# Patient Record
Sex: Male | Born: 1935 | Race: White | Hispanic: No | Smoking: Former smoker
Health system: Southern US, Community
[De-identification: ages and names within clinical notes are randomized; demographics above are authoritative.]

## PROBLEM LIST (undated history)

## (undated) DIAGNOSIS — I10 Essential (primary) hypertension: Secondary | ICD-10-CM

## (undated) DIAGNOSIS — Z87442 Personal history of urinary calculi: Secondary | ICD-10-CM

## (undated) DIAGNOSIS — G4733 Obstructive sleep apnea (adult) (pediatric): Secondary | ICD-10-CM

## (undated) DIAGNOSIS — R519 Headache, unspecified: Secondary | ICD-10-CM

## (undated) DIAGNOSIS — R972 Elevated prostate specific antigen [PSA]: Secondary | ICD-10-CM

## (undated) DIAGNOSIS — N403 Nodular prostate with lower urinary tract symptoms: Secondary | ICD-10-CM

## (undated) DIAGNOSIS — N400 Enlarged prostate without lower urinary tract symptoms: Secondary | ICD-10-CM

## (undated) DIAGNOSIS — K5790 Diverticulosis of intestine, part unspecified, without perforation or abscess without bleeding: Secondary | ICD-10-CM

## (undated) DIAGNOSIS — N138 Other obstructive and reflux uropathy: Secondary | ICD-10-CM

## (undated) DIAGNOSIS — M51369 Other intervertebral disc degeneration, lumbar region without mention of lumbar back pain or lower extremity pain: Secondary | ICD-10-CM

## (undated) DIAGNOSIS — N529 Male erectile dysfunction, unspecified: Secondary | ICD-10-CM

## (undated) DIAGNOSIS — M5430 Sciatica, unspecified side: Secondary | ICD-10-CM

## (undated) DIAGNOSIS — N23 Unspecified renal colic: Secondary | ICD-10-CM

## (undated) DIAGNOSIS — R51 Headache: Secondary | ICD-10-CM

## (undated) DIAGNOSIS — R310 Gross hematuria: Secondary | ICD-10-CM

## (undated) DIAGNOSIS — R339 Retention of urine, unspecified: Secondary | ICD-10-CM

## (undated) DIAGNOSIS — F329 Major depressive disorder, single episode, unspecified: Secondary | ICD-10-CM

## (undated) DIAGNOSIS — R350 Frequency of micturition: Secondary | ICD-10-CM

## (undated) DIAGNOSIS — F32A Depression, unspecified: Secondary | ICD-10-CM

## (undated) DIAGNOSIS — N189 Chronic kidney disease, unspecified: Secondary | ICD-10-CM

## (undated) DIAGNOSIS — N62 Hypertrophy of breast: Secondary | ICD-10-CM

## (undated) DIAGNOSIS — T7840XA Allergy, unspecified, initial encounter: Secondary | ICD-10-CM

## (undated) DIAGNOSIS — L719 Rosacea, unspecified: Secondary | ICD-10-CM

## (undated) DIAGNOSIS — M5136 Other intervertebral disc degeneration, lumbar region: Secondary | ICD-10-CM

## (undated) HISTORY — DX: Benign prostatic hyperplasia without lower urinary tract symptoms: N40.0

## (undated) HISTORY — DX: Diverticulosis of intestine, part unspecified, without perforation or abscess without bleeding: K57.90

## (undated) HISTORY — DX: Elevated prostate specific antigen (PSA): R97.20

## (undated) HISTORY — DX: Male erectile dysfunction, unspecified: N52.9

## (undated) HISTORY — DX: Other intervertebral disc degeneration, lumbar region: M51.36

## (undated) HISTORY — DX: Frequency of micturition: R35.0

## (undated) HISTORY — DX: Gross hematuria: R31.0

## (undated) HISTORY — DX: Obstructive sleep apnea (adult) (pediatric): G47.33

## (undated) HISTORY — DX: Hypertrophy of breast: N62

## (undated) HISTORY — DX: Other obstructive and reflux uropathy: N13.8

## (undated) HISTORY — DX: Personal history of urinary calculi: Z87.442

## (undated) HISTORY — DX: Chronic kidney disease, unspecified: N18.9

## (undated) HISTORY — DX: Rosacea, unspecified: L71.9

## (undated) HISTORY — DX: Headache: R51

## (undated) HISTORY — DX: Allergy, unspecified, initial encounter: T78.40XA

## (undated) HISTORY — DX: Essential (primary) hypertension: I10

## (undated) HISTORY — DX: Nodular prostate with lower urinary tract symptoms: N40.3

## (undated) HISTORY — DX: Unspecified renal colic: N23

## (undated) HISTORY — DX: Other intervertebral disc degeneration, lumbar region without mention of lumbar back pain or lower extremity pain: M51.369

## (undated) HISTORY — DX: Depression, unspecified: F32.A

## (undated) HISTORY — DX: Major depressive disorder, single episode, unspecified: F32.9

## (undated) HISTORY — DX: Headache, unspecified: R51.9

## (undated) HISTORY — DX: Retention of urine, unspecified: R33.9

## (undated) HISTORY — DX: Sciatica, unspecified side: M54.30

## (undated) HISTORY — PX: TRANSURETHRAL RESECTION OF PROSTATE: SHX73

---

## 1999-08-28 HISTORY — PX: OTHER SURGICAL HISTORY: SHX169

## 1999-12-28 HISTORY — PX: ANGIOPLASTY: SHX39

## 2000-05-21 DIAGNOSIS — I2510 Atherosclerotic heart disease of native coronary artery without angina pectoris: Secondary | ICD-10-CM | POA: Insufficient documentation

## 2004-09-26 ENCOUNTER — Encounter: Payer: Self-pay | Admitting: Internal Medicine

## 2004-10-27 ENCOUNTER — Encounter: Payer: Self-pay | Admitting: Internal Medicine

## 2004-11-08 ENCOUNTER — Emergency Department: Payer: Self-pay | Admitting: Emergency Medicine

## 2004-11-26 ENCOUNTER — Encounter: Payer: Self-pay | Admitting: Internal Medicine

## 2004-12-01 ENCOUNTER — Ambulatory Visit: Payer: Self-pay | Admitting: Family Medicine

## 2004-12-01 HISTORY — PX: OTHER SURGICAL HISTORY: SHX169

## 2004-12-27 ENCOUNTER — Encounter: Payer: Self-pay | Admitting: Internal Medicine

## 2005-03-02 ENCOUNTER — Ambulatory Visit: Payer: Self-pay | Admitting: Unknown Physician Specialty

## 2005-06-02 ENCOUNTER — Encounter: Payer: Self-pay | Admitting: Internal Medicine

## 2005-10-19 ENCOUNTER — Ambulatory Visit: Payer: Self-pay | Admitting: Family Medicine

## 2005-10-19 HISTORY — PX: MRI CERVICAL SPINE WO CONTRAST (ARMC HX): HXRAD1796

## 2005-12-23 ENCOUNTER — Ambulatory Visit: Payer: Self-pay | Admitting: General Surgery

## 2006-01-18 ENCOUNTER — Ambulatory Visit: Payer: Self-pay | Admitting: General Surgery

## 2006-05-04 ENCOUNTER — Ambulatory Visit: Payer: Self-pay | Admitting: Urology

## 2006-05-25 DIAGNOSIS — E785 Hyperlipidemia, unspecified: Secondary | ICD-10-CM | POA: Insufficient documentation

## 2006-05-25 DIAGNOSIS — I10 Essential (primary) hypertension: Secondary | ICD-10-CM | POA: Insufficient documentation

## 2006-07-27 HISTORY — PX: CATARACT EXTRACTION: SUR2

## 2006-11-15 ENCOUNTER — Ambulatory Visit: Payer: Self-pay | Admitting: Urology

## 2007-05-10 ENCOUNTER — Ambulatory Visit: Payer: Self-pay | Admitting: Urology

## 2007-08-09 ENCOUNTER — Ambulatory Visit: Payer: Self-pay | Admitting: Family Medicine

## 2007-08-19 ENCOUNTER — Inpatient Hospital Stay: Payer: Self-pay | Admitting: Internal Medicine

## 2007-08-19 ENCOUNTER — Other Ambulatory Visit: Payer: Self-pay

## 2007-08-27 ENCOUNTER — Emergency Department: Payer: Self-pay | Admitting: Emergency Medicine

## 2007-08-27 ENCOUNTER — Other Ambulatory Visit: Payer: Self-pay

## 2007-09-11 HISTORY — PX: SPIROMETRY: SHX456

## 2007-09-27 ENCOUNTER — Ambulatory Visit: Payer: Self-pay | Admitting: Urology

## 2007-10-12 ENCOUNTER — Ambulatory Visit: Payer: Self-pay | Admitting: Urology

## 2007-10-17 ENCOUNTER — Ambulatory Visit: Payer: Self-pay | Admitting: Internal Medicine

## 2007-11-08 ENCOUNTER — Ambulatory Visit: Payer: Self-pay | Admitting: Urology

## 2007-11-21 ENCOUNTER — Ambulatory Visit: Payer: Self-pay | Admitting: Urology

## 2007-12-05 ENCOUNTER — Ambulatory Visit: Payer: Self-pay | Admitting: Urology

## 2008-10-02 ENCOUNTER — Ambulatory Visit: Payer: Self-pay | Admitting: Urology

## 2009-09-19 ENCOUNTER — Ambulatory Visit: Payer: Self-pay | Admitting: Family Medicine

## 2009-10-08 ENCOUNTER — Ambulatory Visit: Payer: Self-pay | Admitting: Urology

## 2009-11-06 ENCOUNTER — Ambulatory Visit: Payer: Self-pay | Admitting: Family Medicine

## 2009-11-26 ENCOUNTER — Ambulatory Visit: Payer: Self-pay | Admitting: Family Medicine

## 2009-12-27 ENCOUNTER — Ambulatory Visit: Payer: Self-pay | Admitting: Family Medicine

## 2010-01-13 ENCOUNTER — Ambulatory Visit: Payer: Self-pay | Admitting: Urology

## 2010-01-28 ENCOUNTER — Ambulatory Visit: Payer: Self-pay | Admitting: Family Medicine

## 2010-01-28 DIAGNOSIS — M519 Unspecified thoracic, thoracolumbar and lumbosacral intervertebral disc disorder: Secondary | ICD-10-CM | POA: Insufficient documentation

## 2010-01-28 DIAGNOSIS — M479 Spondylosis, unspecified: Secondary | ICD-10-CM | POA: Insufficient documentation

## 2010-01-28 HISTORY — PX: MRI CERVICAL SPINE WO CONTRAST (ARMC HX): HXRAD1796

## 2010-04-29 ENCOUNTER — Ambulatory Visit: Payer: Self-pay | Admitting: Urology

## 2010-07-08 ENCOUNTER — Inpatient Hospital Stay: Payer: Self-pay | Admitting: *Deleted

## 2010-11-11 ENCOUNTER — Ambulatory Visit: Payer: Self-pay | Admitting: Urology

## 2011-09-11 ENCOUNTER — Other Ambulatory Visit: Payer: Self-pay | Admitting: Internal Medicine

## 2011-10-06 DIAGNOSIS — G4733 Obstructive sleep apnea (adult) (pediatric): Secondary | ICD-10-CM | POA: Insufficient documentation

## 2011-10-06 HISTORY — PX: OTHER SURGICAL HISTORY: SHX169

## 2011-11-10 ENCOUNTER — Ambulatory Visit: Payer: Self-pay | Admitting: Urology

## 2012-06-09 ENCOUNTER — Ambulatory Visit: Payer: Self-pay | Admitting: Family Medicine

## 2012-11-22 ENCOUNTER — Ambulatory Visit: Payer: Self-pay | Admitting: Urology

## 2012-11-22 DIAGNOSIS — N200 Calculus of kidney: Secondary | ICD-10-CM | POA: Insufficient documentation

## 2012-11-22 DIAGNOSIS — N3942 Incontinence without sensory awareness: Secondary | ICD-10-CM | POA: Insufficient documentation

## 2012-11-22 DIAGNOSIS — N402 Nodular prostate without lower urinary tract symptoms: Secondary | ICD-10-CM | POA: Insufficient documentation

## 2012-11-22 DIAGNOSIS — R339 Retention of urine, unspecified: Secondary | ICD-10-CM | POA: Insufficient documentation

## 2012-11-22 DIAGNOSIS — R972 Elevated prostate specific antigen [PSA]: Secondary | ICD-10-CM | POA: Insufficient documentation

## 2013-03-19 HISTORY — PX: DOPPLER ECHOCARDIOGRAPHY: SHX263

## 2013-04-27 ENCOUNTER — Ambulatory Visit: Payer: Self-pay | Admitting: Urology

## 2013-05-10 ENCOUNTER — Ambulatory Visit: Payer: Self-pay | Admitting: Gastroenterology

## 2013-05-10 LAB — HM COLONOSCOPY

## 2014-04-30 ENCOUNTER — Ambulatory Visit: Payer: Self-pay | Admitting: Urology

## 2015-03-27 LAB — BASIC METABOLIC PANEL
BUN: 17 mg/dL (ref 4–21)
Creatinine: 1.2 mg/dL (ref 0.6–1.3)
Glucose: 111 mg/dL
POTASSIUM: 4.4 mmol/L (ref 3.4–5.3)
Sodium: 142 mmol/L (ref 137–147)

## 2015-03-27 LAB — CBC AND DIFFERENTIAL
HCT: 46 % (ref 41–53)
Hemoglobin: 15.4 g/dL (ref 13.5–17.5)
Platelets: 243 10*3/uL (ref 150–399)
WBC: 6.9 10*3/mL

## 2015-03-27 LAB — HEMOGLOBIN A1C: HEMOGLOBIN A1C: 6

## 2015-03-27 LAB — HEPATIC FUNCTION PANEL
ALT: 11 U/L (ref 10–40)
AST: 19 U/L (ref 14–40)

## 2015-03-27 LAB — LIPID PANEL
CHOLESTEROL: 123 mg/dL (ref 0–200)
HDL: 39 mg/dL (ref 35–70)
LDL Cholesterol: 66 mg/dL
TRIGLYCERIDES: 92 mg/dL (ref 40–160)

## 2015-03-27 LAB — TSH: TSH: 4.5 u[IU]/mL (ref 0.41–5.90)

## 2015-04-21 ENCOUNTER — Ambulatory Visit
Admit: 2015-04-21 | Disposition: A | Discharge status: Home / Self Care | Payer: Self-pay | Attending: Urology | Admitting: Urology

## 2015-07-17 ENCOUNTER — Other Ambulatory Visit: Payer: Self-pay | Admitting: Family Medicine

## 2015-07-17 NOTE — Telephone Encounter (Signed)
Pt. Called stating he needs a refill on his Bisoprolol-HCTZ 5-6.25mg  1 tablet daily. [facility].  CB# [phone] CC

## 2015-07-18 MED ORDER — BISOPROLOL-HYDROCHLOROTHIAZIDE 5-6.25 MG PO TABS: 1.0000 | ORAL_TABLET | Freq: Every day | ORAL | Status: DC

## 2015-07-18 NOTE — Telephone Encounter (Signed)
Refill request for Bisoprolol-HCTZ 5-6.25 mg Last filled by MD on- 05/12/2015 #30 x3 Last Appt: 05/12/2015 Next Appt: none Please advise refill?

## 2015-08-11 ENCOUNTER — Other Ambulatory Visit: Payer: Self-pay

## 2015-08-11 DIAGNOSIS — N200 Calculus of kidney: Secondary | ICD-10-CM

## 2015-09-20 ENCOUNTER — Ambulatory Visit (INDEPENDENT_AMBULATORY_CARE_PROVIDER_SITE_OTHER): Payer: Medicare Other

## 2015-09-20 DIAGNOSIS — Z23 Encounter for immunization: Secondary | ICD-10-CM

## 2016-01-20 ENCOUNTER — Other Ambulatory Visit: Payer: Self-pay | Admitting: Family Medicine

## 2016-02-10 ENCOUNTER — Encounter: Payer: Self-pay | Admitting: *Deleted

## 2016-02-13 ENCOUNTER — Ambulatory Visit (INDEPENDENT_AMBULATORY_CARE_PROVIDER_SITE_OTHER): Payer: Medicare Other | Admitting: Family Medicine

## 2016-02-13 ENCOUNTER — Encounter: Payer: Self-pay | Admitting: Family Medicine

## 2016-02-13 VITALS — BP 142/56 | HR 66 | Temp 97.1°F | Resp 18 | Wt 201.0 lb

## 2016-02-13 DIAGNOSIS — N390 Urinary tract infection, site not specified: Secondary | ICD-10-CM

## 2016-02-13 DIAGNOSIS — R350 Frequency of micturition: Secondary | ICD-10-CM

## 2016-02-13 DIAGNOSIS — R7303 Prediabetes: Secondary | ICD-10-CM | POA: Insufficient documentation

## 2016-02-13 DIAGNOSIS — N183 Chronic kidney disease, stage 3 unspecified: Secondary | ICD-10-CM | POA: Insufficient documentation

## 2016-02-13 DIAGNOSIS — K5730 Diverticulosis of large intestine without perforation or abscess without bleeding: Secondary | ICD-10-CM | POA: Insufficient documentation

## 2016-02-13 DIAGNOSIS — R413 Other amnesia: Secondary | ICD-10-CM | POA: Insufficient documentation

## 2016-02-13 DIAGNOSIS — I10 Essential (primary) hypertension: Secondary | ICD-10-CM | POA: Diagnosis not present

## 2016-02-13 DIAGNOSIS — R51 Headache: Secondary | ICD-10-CM

## 2016-02-13 DIAGNOSIS — N200 Calculus of kidney: Secondary | ICD-10-CM | POA: Insufficient documentation

## 2016-02-13 DIAGNOSIS — H539 Unspecified visual disturbance: Secondary | ICD-10-CM | POA: Insufficient documentation

## 2016-02-13 DIAGNOSIS — K219 Gastro-esophageal reflux disease without esophagitis: Secondary | ICD-10-CM | POA: Insufficient documentation

## 2016-02-13 DIAGNOSIS — J449 Chronic obstructive pulmonary disease, unspecified: Secondary | ICD-10-CM | POA: Insufficient documentation

## 2016-02-13 DIAGNOSIS — G8929 Other chronic pain: Secondary | ICD-10-CM | POA: Insufficient documentation

## 2016-02-13 DIAGNOSIS — N400 Enlarged prostate without lower urinary tract symptoms: Secondary | ICD-10-CM

## 2016-02-13 DIAGNOSIS — R519 Headache, unspecified: Secondary | ICD-10-CM | POA: Insufficient documentation

## 2016-02-13 DIAGNOSIS — K5900 Constipation, unspecified: Secondary | ICD-10-CM | POA: Insufficient documentation

## 2016-02-13 DIAGNOSIS — S20219A Contusion of unspecified front wall of thorax, initial encounter: Secondary | ICD-10-CM | POA: Insufficient documentation

## 2016-02-13 DIAGNOSIS — E162 Hypoglycemia, unspecified: Secondary | ICD-10-CM | POA: Insufficient documentation

## 2016-02-13 DIAGNOSIS — J309 Allergic rhinitis, unspecified: Secondary | ICD-10-CM | POA: Insufficient documentation

## 2016-02-13 LAB — POCT URINALYSIS DIPSTICK
Bilirubin, UA: NEGATIVE
Glucose, UA: NEGATIVE
KETONES UA: NEGATIVE
LEUKOCYTES UA: NEGATIVE
Nitrite, UA: NEGATIVE
Spec Grav, UA: 1.025
Urobilinogen, UA: 0.2
pH, UA: 6

## 2016-02-13 LAB — POCT GLYCOSYLATED HEMOGLOBIN (HGB A1C)
Est. average glucose Bld gHb Est-mCnc: 123
Hemoglobin A1C: 5.9

## 2016-02-13 MED ORDER — CIPROFLOXACIN HCL 500 MG PO TABS: 500.0000 mg | ORAL_TABLET | Freq: Two times a day (BID) | ORAL | Status: AC

## 2016-02-13 NOTE — Progress Notes (Signed)
Patient: Jose [name]. Male    DOB: August 24, 1928   82 y.o.   MRN: [id number] Visit Date: 02/13/2016  Today's Provider: Mila Merry, MD   Chief Complaint  Patient presents with  . Urinary Frequency    x 2 months   Subjective:    Urinary Frequency  The current episode started more than 1 month ago. The problem has been gradually worsening. The patient is experiencing no pain. There has been no fever. Associated symptoms include frequency and sweats. Pertinent negatives include no chills, flank pain, hematuria, hesitancy, nausea or vomiting.  Patient states his urine has also been dribbling throughout the day at times he is not needing to urinate.  He has history of BPH treated by Dr. Achilles Dunk years ago, but has long been off of Flomax.      Allergies  Allergen Reactions  . Finasteride     Other reaction(s): Other (See Comments) PAIN IN BREAST   Previous Medications   BISOPROLOL-HYDROCHLOROTHIAZIDE (ZIAC) 5-6.25 MG TABLET    take 1 tablet by mouth once daily   SIMVASTATIN (ZOCOR) 40 MG TABLET    Take 1 tablet by mouth daily.    Review of Systems  Constitutional: Positive for diaphoresis and fatigue. Negative for fever, chills and appetite change.  Respiratory: Negative for chest tightness, shortness of breath and wheezing.   Cardiovascular: Negative for chest pain and palpitations.  Gastrointestinal: Negative for nausea, vomiting and abdominal pain.  Endocrine: Positive for polyuria.  Genitourinary: Positive for frequency and discharge. Negative for dysuria, hesitancy, hematuria, flank pain, penile swelling, scrotal swelling, difficulty urinating, penile pain and testicular pain.    Social History  Substance Use Topics  . Smoking status: Former Smoker -- 3.00 packs/day for 25 years    Types: Cigarettes    Quit date: 12/27/1978  . Smokeless tobacco: Not on file  . Alcohol Use: No   Objective:   BP 142/56 mmHg  Pulse 66  Temp(Src) 97.1 F (36.2 C) (Oral)  Resp  18  Wt 201 lb (91.173 kg)  SpO2 94%  Physical Exam  General appearance: alert, well developed, well nourished, cooperative and in no distress Head: Normocephalic, without obvious abnormality, atraumatic Lungs: Respirations even and unlabored Extremities: No gross deformities Skin: Skin color, texture, turgor normal. No rashes seen  Psych: Appropriate mood and affect. Neurologic: Mental status: Alert, oriented to person, place, and time, thought content appropriate.  Results for orders placed or performed in visit on 02/13/16  POCT urinalysis dipstick  Result Value Ref Range   Color, UA yellow    Clarity, UA clear    Glucose, UA negative    Bilirubin, UA negative    Ketones, UA negative    Spec Grav, UA 1.025    Blood, UA Trace (non hemolyzed)    pH, UA 6.0    Protein, UA trace    Urobilinogen, UA 0.2    Nitrite, UA negative    Leukocytes, UA Negative Negative  POCT glycosylated hemoglobin (Hb A1C)  Result Value Ref Range   Hemoglobin A1C 5.9    Est. average glucose Bld gHb Est-mCnc 123        Assessment & Plan:  1. Frequent urination Cover for prostatitis. Likely some BPH. Consider starting back on Flomax.  - POCT urinalysis dipstick - ciprofloxacin (CIPRO) 500 MG tablet; Take 1 tablet (500 mg total) by mouth 2 (two) times daily.  Dispense: 14 tablet; Refill: 0  2. Pre-diabetes Normal A1c today - POCT  glycosylated hemoglobin (Hb A1C)  3. Essential (primary) hypertension Well controlled.  .cmc   4. UTI (lower urinary tract infection)  - ciprofloxacin (CIPRO) 500 MG tablet; Take 1 tablet (500 mg total) by mouth 2 (two) times daily.  Dispense: 14 tablet; Refill: 0 - Urine culture        Mila Merry, MD  [facility]

## 2016-02-15 LAB — URINE CULTURE

## 2016-02-20 ENCOUNTER — Encounter: Payer: Self-pay | Admitting: Family Medicine

## 2016-02-20 DIAGNOSIS — M1990 Unspecified osteoarthritis, unspecified site: Secondary | ICD-10-CM | POA: Insufficient documentation

## 2016-02-20 DIAGNOSIS — I519 Heart disease, unspecified: Secondary | ICD-10-CM | POA: Insufficient documentation

## 2016-04-12 ENCOUNTER — Encounter: Payer: Self-pay | Admitting: *Deleted

## 2016-04-12 ENCOUNTER — Ambulatory Visit
Admission: RE | Admit: 2016-04-12 | Discharge: 2016-04-12 | Disposition: A | Discharge status: Home / Self Care | Payer: Medicare Other | Source: Ambulatory Visit | Attending: Urology | Admitting: Urology

## 2016-04-12 ENCOUNTER — Ambulatory Visit (INDEPENDENT_AMBULATORY_CARE_PROVIDER_SITE_OTHER): Payer: Medicare Other | Admitting: Urology

## 2016-04-12 VITALS — BP 153/64 | HR 69 | Ht 69.0 in | Wt 201.1 lb

## 2016-04-12 DIAGNOSIS — R109 Unspecified abdominal pain: Secondary | ICD-10-CM

## 2016-04-12 DIAGNOSIS — N200 Calculus of kidney: Secondary | ICD-10-CM

## 2016-04-12 DIAGNOSIS — N528 Other male erectile dysfunction: Secondary | ICD-10-CM

## 2016-04-12 DIAGNOSIS — N138 Other obstructive and reflux uropathy: Secondary | ICD-10-CM

## 2016-04-12 DIAGNOSIS — N401 Enlarged prostate with lower urinary tract symptoms: Secondary | ICD-10-CM | POA: Diagnosis not present

## 2016-04-12 DIAGNOSIS — R938 Abnormal findings on diagnostic imaging of other specified body structures: Secondary | ICD-10-CM | POA: Insufficient documentation

## 2016-04-12 DIAGNOSIS — N529 Male erectile dysfunction, unspecified: Secondary | ICD-10-CM

## 2016-04-12 LAB — URINALYSIS, COMPLETE
Bilirubin, UA: NEGATIVE
GLUCOSE, UA: NEGATIVE
KETONES UA: NEGATIVE
Leukocytes, UA: NEGATIVE
NITRITE UA: NEGATIVE
SPEC GRAV UA: 1.02 (ref 1.005–1.030)
UUROB: 0.2 mg/dL (ref 0.2–1.0)
pH, UA: 5 (ref 5.0–7.5)

## 2016-04-12 LAB — MICROSCOPIC EXAMINATION

## 2016-04-12 MED ORDER — TAMSULOSIN HCL 0.4 MG PO CAPS: 0.4000 mg | ORAL_CAPSULE | Freq: Every day | ORAL | Status: DC

## 2016-04-12 NOTE — Progress Notes (Signed)
04/12/2016 4:34 PM   Simona Huh. 03-Jul-1928 [id number]  Referring provider: Malva Limes, MD [address] [place], [place] [address]  Chief Complaint  Patient presents with  . Nephrolithiasis    1 year follow up---patient thinks he has a stone now    HPI: Patient is an 82 year old Caucasian male with a history of nephrolithiasis who presents today for 1 year follow-up.  Patient has a history of bilateral nephrolithiasis.  He states that for the past 4 days she has been having intermittent right flank pain.  Describes the pain as a mild gnawing sensation that is intermittent.  It radiates into the right waist.  He has not noticed anything that makes the pain worse or better.  He has not had any associated hematuria or dysuria. He denies fevers, chills, nausea and vomiting.  KUB taken on 04/12/2016 noted a possible distal third right ureteral stone and the stone over the lower pole of the left kidney.  I personally reviewed the KUB with the patient.    His baseline urinary symptoms are frequent urination, urgency, nocturia, incontinence and urinary hesitancy.    He also has erectile dysfunction.  PMH: Past Medical History  Diagnosis Date  . Diverticulosis   . OSA (obstructive sleep apnea)   . Allergy   . Headache   . Nodular prostate with urinary obstruction   . Incomplete bladder emptying   . Organic impotence   . Elevated PSA   . Gross hematuria   . Sciatica   . Urinary frequency   . Renal colic   . Rosacea   . HTN (hypertension)   . Depression   . DDD (degenerative disc disease), lumbar   . Gynecomastia   . History of kidney stones   . BPH (benign prostatic hyperplasia)   . Chronic kidney disease     Surgical History: Past Surgical History  Procedure Laterality Date  . Cataract extraction Bilateral 07/2006  . Angioplasty  2001    PTCA and stenting of LAD by Dr. Juliann Pares  . Double ureter stent placement  08/1999    Double J Ureter stent  placement  . Transurethral resection of prostate    . Doppler echocardiography  03/19/2013    Normal; Moderate global LV dysfunction. EF=45%. Mild Aortic insufficiency  . Sleep study  10/06/2011    Severe sleep apnea. AHI= 35.4 per hr. Done at [facility]  . Mri cervical spine wo contrast ([facility] hx)  01/28/2010    Abnormal Results; Arhritis and bulging discs. Referral to Neurosurgery  . Mri cervical spine wo contrast ([facility] hx)  10/19/2005    Abnormal, Bone spurs  . Ct scan of head  12/01/2004    Normal  . Spirometry  09/11/2007    Moderately Severe obstruction    Home Medications:    Medication List       This list is accurate as of: 04/12/16  4:34 PM.  Always use your most recent med list.               bisoprolol-hydrochlorothiazide 5-6.25 MG tablet  Commonly known as:  ZIAC  take 1 tablet by mouth once daily     gabapentin 100 MG capsule  Commonly known as:  NEURONTIN  Reported on 04/12/2016     PROBIOTIC ADVANCED PO  Take by mouth.     simvastatin 40 MG tablet  Commonly known as:  ZOCOR  Take 1 tablet by mouth daily.     tamsulosin  0.4 MG Caps capsule  Commonly known as:  FLOMAX  Take 1 capsule (0.4 mg total) by mouth daily.        Allergies:  Allergies  Allergen Reactions  . Finasteride     Other reaction(s): Other (See Comments) PAIN IN BREAST    Family History: Family History  Problem Relation Age of Onset  . Heart disease Father   . Diabetes Father     type 2  . Hyperlipidemia Father   . Stroke Father   . Alcohol abuse Paternal Uncle   . Hypertension Other   . Breast cancer Other   . Lung cancer Other   . Kidney disease Father   . Prostate cancer Neg Hx     Social History:  reports that he quit smoking about 37 years ago. His smoking use included Cigarettes. He has a 75 pack-year smoking history. He does not have any smokeless tobacco history on file. He reports that he does not drink alcohol or use illicit  drugs.  ROS: UROLOGY Frequent Urination?: Yes Hard to postpone urination?: Yes Burning/pain with urination?: No Get up at night to urinate?: Yes Leakage of urine?: Yes Urine stream starts and stops?: Yes Trouble starting stream?: No Do you have to strain to urinate?: No Blood in urine?: No Urinary tract infection?: No Sexually transmitted disease?: No Injury to kidneys or bladder?: No Painful intercourse?: No Weak stream?: No Erection problems?: Yes Penile pain?: Yes  Gastrointestinal Nausea?: No Vomiting?: No Indigestion/heartburn?: No Diarrhea?: No Constipation?: Yes  Constitutional Fever: No Night sweats?: No Weight loss?: No Fatigue?: Yes  Skin Skin rash/lesions?: No Itching?: No  Eyes Blurred vision?: Yes Double vision?: No  Ears/Nose/Throat Sore throat?: No Sinus problems?: Yes  Hematologic/Lymphatic Swollen glands?: No Easy bruising?: No  Cardiovascular Leg swelling?: No Chest pain?: No  Respiratory Cough?: Yes Shortness of breath?: No  Endocrine Excessive thirst?: No  Musculoskeletal Back pain?: Yes Joint pain?: No  Neurological Headaches?: Yes Dizziness?: No  Psychologic Depression?: No Anxiety?: No  Physical Exam: BP 153/64 mmHg  Pulse 69  Ht  (1.753 m)  Wt 201 lb 1.6 oz (91.218 kg)  BMI 29.68 kg/m2  Constitutional: Well nourished. Alert and oriented, No acute distress. HEENT: [place] AT, moist mucus membranes. Trachea midline, no masses. Cardiovascular: No clubbing, cyanosis, or edema. Respiratory: Normal respiratory effort, no increased work of breathing. GI: Abdomen is soft, non tender, non distended, no abdominal masses. Liver and spleen not palpable.  No hernias appreciated.  Stool sample for occult testing is not indicated.   GU: No CVA tenderness.  No bladder fullness or masses.  Patient with uncircumcised phallus. Foreskin easily retracted  Urethral meatus is patent.  No penile discharge. No penile lesions or  rashes. Scrotum without lesions, cysts, rashes and/or edema.  Testicles are located scrotally bilaterally. No masses are appreciated in the testicles. Left and right epididymis are normal. Rectal: Patient with  normal sphincter tone. Anus and perineum without scarring or rashes. No rectal masses are appreciated. Prostate is approximately 55 grams, no nodules are appreciated. Seminal vesicles are normal. Skin: No rashes, bruises or suspicious lesions. Lymph: No cervical or inguinal adenopathy. Neurologic: Grossly intact, no focal deficits, moving all 4 extremities. Psychiatric: Normal mood and affect.  Laboratory Data: Lab Results  Component Value Date   WBC 6.9 03/27/2015   HGB 15.4 03/27/2015   HCT 46 03/27/2015   PLT 243 03/27/2015    Lab Results  Component Value Date   CREATININE 1.2 03/27/2015  Lab Results  Component Value Date   HGBA1C 5.9 02/13/2016    Lab Results  Component Value Date   TSH 4.50 03/27/2015       Component Value Date/Time   CHOL 123 03/27/2015   HDL 39 03/27/2015   LDLCALC 66 03/27/2015    Lab Results  Component Value Date   AST 19 03/27/2015   Lab Results  Component Value Date   ALT 11 03/27/2015     Urinalysis Results for orders placed or performed in visit on 04/12/16  Microscopic Examination  Result Value Ref Range   WBC, UA 0-5 0 -  5 /hpf   RBC, UA 0-2 0 -  2 /hpf   Epithelial Cells (non renal) 0-10 0 - 10 /hpf   Bacteria, UA Few (A) None seen/Few  Urinalysis, Complete  Result Value Ref Range   Specific Gravity, UA 1.020 1.005 - 1.030   pH, UA 5.0 5.0 - 7.5   Color, UA Yellow Yellow   Appearance Ur Clear Clear   Leukocytes, UA Negative Negative   Protein, UA 1+ (A) Negative/Trace   Glucose, UA Negative Negative   Ketones, UA Negative Negative   RBC, UA Trace (A) Negative   Bilirubin, UA Negative Negative   Urobilinogen, Ur 0.2 0.2 - 1.0 mg/dL   Nitrite, UA Negative Negative   Microscopic Examination See below:      Pertinent Imaging: CLINICAL DATA: Follow-up kidney stones; back pain but no current abdominal pain  EXAM: ABDOMEN - 1 VIEW  COMPARISON: KUB of April 21, 2015  FINDINGS: The colonic stool burden is moderately increased overlying the kidneys. There is an approximately 3 mm diameter calcification which projects over the mid to lower pole of the left kidney. Known stones on the right are not clearly visible today. There may be a stone projecting over the distal third of the right ureter. This was not clearly evident on the previous study. There are also numerous phleboliths and arterial calcifications in the pelvis.  IMPRESSION: A stone over the lower pole of the left kidney is observed. Possible distal third right ureteral stone as well.   Electronically Signed  By: David SwazilandJordan M.D.  On: 04/12/2016 15:22  Assessment & Plan:   1. Kidney stones:   Patient has a history of bilateral nephrolithiasis.  KUB taken today demonstrates a possible right ureteral stone.  We'll obtain a CT Stone study for further evaluation.  - Urinalysis, Complete  2. Right flank pain:   We will be obtaining a CT stone study for further evaluation.  He is advised that if he should develop fever/chills, nausea/vomiting or intractable pain to contact our office or seek care in the emergency department immediately.  3. BPH with LUTS:   Patient does not find his urinary symptoms bothersome at this point.  We will continue to monitor.  4. Erectile dysfunction:   Patient's erectile dysfunction is not bothersome at this point.  We will continue to monitor.    Return for CT Stone study report.  These notes generated with voice recognition software. I apologize for typographical errors.  Michiel CowboySHANNON Javone Ybanez, PA-C  [facility] [address], [address] [place], [place] [id number] [phone]

## 2016-04-14 DIAGNOSIS — N529 Male erectile dysfunction, unspecified: Secondary | ICD-10-CM | POA: Insufficient documentation

## 2016-04-14 DIAGNOSIS — N401 Enlarged prostate with lower urinary tract symptoms: Secondary | ICD-10-CM | POA: Insufficient documentation

## 2016-04-14 DIAGNOSIS — N138 Other obstructive and reflux uropathy: Secondary | ICD-10-CM | POA: Insufficient documentation

## 2016-04-14 DIAGNOSIS — N200 Calculus of kidney: Secondary | ICD-10-CM | POA: Insufficient documentation

## 2016-04-14 DIAGNOSIS — R109 Unspecified abdominal pain: Secondary | ICD-10-CM | POA: Insufficient documentation

## 2016-04-15 ENCOUNTER — Ambulatory Visit
Admission: RE | Admit: 2016-04-15 | Discharge: 2016-04-15 | Disposition: A | Discharge status: Home / Self Care | Payer: Medicare Other | Source: Ambulatory Visit | Attending: Urology | Admitting: Urology

## 2016-04-15 DIAGNOSIS — N400 Enlarged prostate without lower urinary tract symptoms: Secondary | ICD-10-CM | POA: Diagnosis not present

## 2016-04-15 DIAGNOSIS — R109 Unspecified abdominal pain: Secondary | ICD-10-CM

## 2016-04-15 DIAGNOSIS — Q6102 Congenital multiple renal cysts: Secondary | ICD-10-CM | POA: Insufficient documentation

## 2016-04-15 DIAGNOSIS — I700 Atherosclerosis of aorta: Secondary | ICD-10-CM | POA: Diagnosis not present

## 2016-04-15 DIAGNOSIS — N202 Calculus of kidney with calculus of ureter: Secondary | ICD-10-CM | POA: Diagnosis not present

## 2016-04-15 DIAGNOSIS — K5730 Diverticulosis of large intestine without perforation or abscess without bleeding: Secondary | ICD-10-CM | POA: Diagnosis not present

## 2016-04-16 ENCOUNTER — Ambulatory Visit (INDEPENDENT_AMBULATORY_CARE_PROVIDER_SITE_OTHER): Payer: Medicare Other | Admitting: Urology

## 2016-04-16 ENCOUNTER — Encounter: Payer: Self-pay | Admitting: Urology

## 2016-04-16 VITALS — BP 164/69 | HR 69 | Ht 70.0 in | Wt 200.1 lb

## 2016-04-16 DIAGNOSIS — N200 Calculus of kidney: Secondary | ICD-10-CM | POA: Diagnosis not present

## 2016-04-16 DIAGNOSIS — R109 Unspecified abdominal pain: Secondary | ICD-10-CM | POA: Diagnosis not present

## 2016-04-16 DIAGNOSIS — N201 Calculus of ureter: Secondary | ICD-10-CM | POA: Diagnosis not present

## 2016-04-16 LAB — URINALYSIS, COMPLETE
BILIRUBIN UA: NEGATIVE
GLUCOSE, UA: NEGATIVE
Ketones, UA: NEGATIVE
Leukocytes, UA: NEGATIVE
Nitrite, UA: NEGATIVE
PH UA: 5 (ref 5.0–7.5)
RBC UA: NEGATIVE
Specific Gravity, UA: 1.02 (ref 1.005–1.030)
UUROB: 0.2 mg/dL (ref 0.2–1.0)

## 2016-04-16 LAB — MICROSCOPIC EXAMINATION: Bacteria, UA: NONE SEEN

## 2016-04-18 LAB — CULTURE, URINE COMPREHENSIVE

## 2016-04-18 NOTE — Progress Notes (Signed)
5:06 PM   Jose [name] 03/27/1928 [id number]  Referring provider: Birdie Sons, MD [address] [place], [place] [address]  Chief Complaint  Patient presents with  . Results    CT    HPI: Patient is an 82 year old Caucasian male who presents today to discuss his CT renal stone study results that were ordered due to a possible right ureteral stone associated with right flank pain.  Background history Patient with a history of bilateral nephrolithiasis with sudden onset of right sided flank pain.  KUB taken on 04/12/2016 noted a possible distal third right ureteral stone and the stone over the lower pole of the left kidney.    His baseline urinary symptoms are frequent urination, urgency, nocturia, incontinence and urinary hesitancy.    He also has erectile dysfunction.  Today, patient is still experiencing right-sided flank pain. He is having associated worsening of his lower urinary tract symptoms. He has not passed a fragment. He denies dysuria and gross hematuria.  He is not had any recent fevers, chills, nausea or vomiting.  CT renal stone study completed on 04/15/2016 noted bilateral nephrolithiasis with a right UVJ stone approximately 4 mm in size causing mild hydroureter and pelvocaliectasis.  I have personally reviewed the films with the patient.   His UA today was unremarkable.   PMH: Past Medical History  Diagnosis Date  . Diverticulosis   . OSA (obstructive sleep apnea)   . Allergy   . Headache   . Nodular prostate with urinary obstruction   . Incomplete bladder emptying   . Organic impotence   . Elevated PSA   . Gross hematuria   . Sciatica   . Urinary frequency   . Renal colic   . Rosacea   . HTN (hypertension)   . Depression   . DDD (degenerative disc disease), lumbar   . Gynecomastia   . History of kidney stones   . BPH (benign prostatic hyperplasia)   . Chronic kidney disease     Surgical History: Past Surgical History    Procedure Laterality Date  . Cataract extraction Bilateral 07/2006  . Angioplasty  2001    PTCA and stenting of LAD by Dr. Clayborn Bigness  . Double ureter stent placement  08/1999    Double J Ureter stent placement  . Transurethral resection of prostate    . Doppler echocardiography  03/19/2013    Normal; Moderate global LV dysfunction. EF=45%. Mild Aortic insufficiency  . Sleep study  10/06/2011    Severe sleep apnea. AHI= 35.4 per hr. Done at [facility]  . Mri cervical spine wo contrast ([facility] hx)  01/28/2010    Abnormal Results; Arhritis and bulging discs. Referral to Neurosurgery  . Mri cervical spine wo contrast ([facility] hx)  10/19/2005    Abnormal, Bone spurs  . Ct scan of head  12/01/2004    Normal  . Spirometry  09/11/2007    Moderately Severe obstruction    Home Medications:    Medication List       This list is accurate as of: 04/16/16 11:59 PM.  Always use your most recent med list.               bisoprolol-hydrochlorothiazide 5-6.25 MG tablet  Commonly known as:  ZIAC  take 1 tablet by mouth once daily     gabapentin 100 MG capsule  Commonly known as:  NEURONTIN  Reported on 04/12/2016     PROBIOTIC ADVANCED PO  Take  by mouth.     simvastatin 40 MG tablet  Commonly known as:  ZOCOR  Take 1 tablet by mouth daily.     tamsulosin 0.4 MG Caps capsule  Commonly known as:  FLOMAX  Take 1 capsule (0.4 mg total) by mouth daily.        Allergies:  Allergies  Allergen Reactions  . Finasteride     Other reaction(s): Other (See Comments) PAIN IN BREAST    Family History: Family History  Problem Relation Age of Onset  . Heart disease Father   . Diabetes Father     type 2  . Hyperlipidemia Father   . Stroke Father   . Alcohol abuse Paternal Uncle   . Hypertension Other   . Breast cancer Other   . Lung cancer Other   . Kidney disease Father   . Prostate cancer Neg Hx     Social History:  reports that he quit smoking about 37 years ago. His  smoking use included Cigarettes. He has a 75 pack-year smoking history. He does not have any smokeless tobacco history on file. He reports that he does not drink alcohol or use illicit drugs.  ROS: UROLOGY Frequent Urination?: Yes Hard to postpone urination?: Yes Burning/pain with urination?: No Get up at night to urinate?: Yes Leakage of urine?: Yes Urine stream starts and stops?: Yes Trouble starting stream?: Yes Do you have to strain to urinate?: No Blood in urine?: No Urinary tract infection?: No Sexually transmitted disease?: No Injury to kidneys or bladder?: No Painful intercourse?: No Weak stream?: No Erection problems?: Yes Penile pain?: No  Gastrointestinal Nausea?: No Vomiting?: No Indigestion/heartburn?: No Diarrhea?: No Constipation?: Yes  Constitutional Fever: No Night sweats?: Yes Weight loss?: No Fatigue?: No  Skin Skin rash/lesions?: No Itching?: No  Eyes Blurred vision?: Yes Double vision?: Yes  Ears/Nose/Throat Sore throat?: No Sinus problems?: Yes  Hematologic/Lymphatic Swollen glands?: No Easy bruising?: No  Cardiovascular Leg swelling?: No Chest pain?: No  Respiratory Cough?: No Shortness of breath?: No  Endocrine Excessive thirst?: No  Musculoskeletal Back pain?: Yes Joint pain?: No  Neurological Headaches?: Yes Dizziness?: Yes  Psychologic Depression?: No Anxiety?: No  Physical Exam: BP 164/69 mmHg  Pulse 69  Ht _0  (1.778 m)  Wt 200 lb 1.6 oz (90.765 kg)  BMI 28.71 kg/m2  Constitutional: Well nourished. Alert and oriented, No acute distress. HEENT: [place] AT, moist mucus membranes. Trachea midline, no masses. Cardiovascular: No clubbing, cyanosis, or edema. Respiratory: Normal respiratory effort, no increased work of breathing. GI: Abdomen is soft, non tender, non distended, no abdominal masses. Liver and spleen not palpable.  No hernias appreciated.  Stool sample for occult testing is not indicated.   GU: No  CVA tenderness.  No bladder fullness or masses.   Skin: No rashes, bruises or suspicious lesions. Lymph: No cervical or inguinal adenopathy. Neurologic: Grossly intact, no focal deficits, moving all 4 extremities. Psychiatric: Normal mood and affect.  Laboratory Data: Lab Results  Component Value Date   WBC 6.9 03/27/2015   HGB 15.4 03/27/2015   HCT 46 03/27/2015   PLT 243 03/27/2015    Lab Results  Component Value Date   CREATININE 1.2 03/27/2015    Lab Results  Component Value Date   HGBA1C 5.9 02/13/2016    Lab Results  Component Value Date   TSH 4.50 03/27/2015       Component Value Date/Time   CHOL 123 03/27/2015   HDL 39 03/27/2015   LDLCALC 66  03/27/2015    Lab Results  Component Value Date   AST 19 03/27/2015   Lab Results  Component Value Date   ALT 11 03/27/2015     Urinalysis Microscopic Examination  Result Value Ref Range   WBC, UA 0-5 0 -  5 /hpf   RBC, UA 0-2 0 -  2 /hpf   Epithelial Cells (non renal) 0-10 0 - 10 /hpf   Bacteria, UA None seen None seen/Few  Urinalysis, Complete  Result Value Ref Range   Specific Gravity, UA 1.020 1.005 - 1.030   pH, UA 5.0 5.0 - 7.5   Color, UA Yellow Yellow   Appearance Ur Clear Clear   Leukocytes, UA Negative Negative   Protein, UA 1+ (A) Negative/Trace   Glucose, UA Negative Negative   Ketones, UA Negative Negative   RBC, UA Negative Negative   Bilirubin, UA Negative Negative   Urobilinogen, Ur 0.2 0.2 - 1.0 mg/dL   Nitrite, UA Negative Negative   Microscopic Examination See below:      Pertinent Imaging: CLINICAL DATA: Right flank pain and frequent urination.  EXAM: CT ABDOMEN AND PELVIS WITHOUT CONTRAST  TECHNIQUE: Multidetector CT imaging of the abdomen and pelvis was performed following the standard protocol without IV contrast.  COMPARISON: None  FINDINGS: Lower chest: No pleural fluid. The lung bases are clear.  Hepatobiliary: No suspicious liver abnormalities  identified. The gallbladder appears normal. There is no biliary dilatation.  Pancreas: No inflammation or mass identified.  Spleen: The spleen appears normal.  Adrenals/Urinary Tract: Normal adrenal glands. Bilateral renal cysts are identified. These are incompletely characterized without IV contrast material. Bilateral nephrolithiasis noted. There is a stone at the right UVJ which measures 4 mm, image 68 of series 2. This results in mild right hydroureter and pelvocaliectasis. Nonobstructing left renal calculi noted.  Stomach/Bowel: The stomach is within normal limits. The small bowel loops have a normal course and caliber. No obstruction. Numerous distal colonic diverticula identified without acute inflammation.  Vascular/Lymphatic: Calcified atherosclerotic disease involves the abdominal aorta. No aneurysm. No enlarged retroperitoneal or mesenteric adenopathy. No enlarged pelvic or inguinal lymph nodes.  Reproductive: Mild prostate gland enlargement.  Other: There is no ascites or focal fluid collections within the abdomen or pelvis.  Musculoskeletal: No suspicious lytic or sclerotic bone lesions identified.  IMPRESSION: 1. Bilateral nephrolithiasis. At the right UVJ there is a 4 mm calculus which causes mild hydroureter and pelvocaliectasis. 2. Aortic atherosclerosis.   Electronically Signed  By: Kerby Moors M.D.  On: 04/15/2016 16:16  Assessment & Plan:   Patient will be undergoing right ESWL for definitive treatment of a 4 mm UVJ stone. Patient has cardiac history and sleep apnea and we will be obtaining cardiac clearance prior to proceeding with the procedure.  1. Right UVJ stone:   Patient does not want to undergo MET for the right UVJ stone.  We discussed the risks, benefits and success rate for ESWL and URS/LL/ureteral stent placement.  Patient is desiring to undergo ESWL.  I explained to the patient how ESWL was performed and the risks involved.   Specifically, I stated that undergoing ESWL does not prevent pain with the passage of the stone.   ESWL breaks the stones into fragments and the patient still needs to pass these fragments which can be quite painful.  I also explained to the patient that if the ESWL was unsuccessful, he may need to undergo another procedure to rid himself of the stone burden.  He states his  understanding and is agreeable to undergo ESWL.  He will continue the tamsulosin 0.4 mg daily and push fluids. If he should pass the stone prior to his procedure today, he will contact the office.  If he should experience fevers/chills or intractable pain/vomiting, he should seek treatment in the emergency department.    - Urinalysis, Complete - CULTURE, URINE COMPREHENSIVE  2. Right flank pain:   Most likely caused by the right UVJ stone. He will be undergoing right ESWL in the future. We will continue to monitor.   He will seek treatment in the emergency room if pain becomes unbearable.     3. BPH with LUTS:   Patient does not find his urinary symptoms bothersome at this point.  We will continue to monitor.  4. Erectile dysfunction:   Patient's erectile dysfunction is not bothersome at this point.  We will continue to monitor.    5. Bilateral nephrolithiasis:   Bilateral nephrolithiasis confirmed with CT renal stone study. We will continue to monitor with annual KUB's.    Return for obtain cardiac clearance for right ESWL.  These notes generated with voice recognition software. I apologize for typographical errors.  Zara Council, [facility] [address], [place], [place] [address] [phone]

## 2016-04-19 ENCOUNTER — Encounter
Admission: RE | Admit: 2016-04-19 | Discharge: 2016-04-19 | Disposition: A | Discharge status: Home / Self Care | Payer: Medicare Other | Source: Ambulatory Visit | Attending: Urology | Admitting: Urology

## 2016-04-19 DIAGNOSIS — Z01810 Encounter for preprocedural cardiovascular examination: Secondary | ICD-10-CM | POA: Diagnosis not present

## 2016-04-23 ENCOUNTER — Other Ambulatory Visit: Payer: Self-pay | Admitting: Radiology

## 2016-04-23 ENCOUNTER — Ambulatory Visit: Payer: Self-pay | Admitting: Urology

## 2016-04-23 DIAGNOSIS — N200 Calculus of kidney: Secondary | ICD-10-CM

## 2016-04-26 ENCOUNTER — Ambulatory Visit: Payer: Medicare Other | Admitting: Urology

## 2016-04-27 ENCOUNTER — Telehealth: Payer: Self-pay | Admitting: Radiology

## 2016-04-27 NOTE — Telephone Encounter (Signed)
Notified pt of ESWL schedule moved up to the morning of 04/29/16. His arrival time is now 6:30. Pt voices understanding.

## 2016-04-28 ENCOUNTER — Encounter: Payer: Self-pay | Admitting: *Deleted

## 2016-04-29 ENCOUNTER — Ambulatory Visit: Payer: Medicare Other

## 2016-04-29 ENCOUNTER — Encounter: Payer: Self-pay | Admitting: *Deleted

## 2016-04-29 ENCOUNTER — Encounter
Admission: RE | Disposition: A | Discharge status: Home / Self Care | Payer: Self-pay | Source: Ambulatory Visit | Attending: Urology

## 2016-04-29 ENCOUNTER — Ambulatory Visit
Admission: RE | Admit: 2016-04-29 | Discharge: 2016-04-29 | Disposition: A | Discharge status: Home / Self Care | Payer: Medicare Other | Source: Ambulatory Visit | Attending: Urology | Admitting: Urology

## 2016-04-29 DIAGNOSIS — I209 Angina pectoris, unspecified: Secondary | ICD-10-CM | POA: Diagnosis not present

## 2016-04-29 DIAGNOSIS — N201 Calculus of ureter: Secondary | ICD-10-CM | POA: Diagnosis present

## 2016-04-29 DIAGNOSIS — N132 Hydronephrosis with renal and ureteral calculous obstruction: Secondary | ICD-10-CM | POA: Diagnosis not present

## 2016-04-29 DIAGNOSIS — I700 Atherosclerosis of aorta: Secondary | ICD-10-CM | POA: Insufficient documentation

## 2016-04-29 DIAGNOSIS — G4733 Obstructive sleep apnea (adult) (pediatric): Secondary | ICD-10-CM | POA: Insufficient documentation

## 2016-04-29 DIAGNOSIS — Z87891 Personal history of nicotine dependence: Secondary | ICD-10-CM | POA: Diagnosis not present

## 2016-04-29 DIAGNOSIS — N401 Enlarged prostate with lower urinary tract symptoms: Secondary | ICD-10-CM | POA: Diagnosis not present

## 2016-04-29 DIAGNOSIS — Z955 Presence of coronary angioplasty implant and graft: Secondary | ICD-10-CM | POA: Insufficient documentation

## 2016-04-29 DIAGNOSIS — I10 Essential (primary) hypertension: Secondary | ICD-10-CM | POA: Insufficient documentation

## 2016-04-29 HISTORY — PX: EXTRACORPOREAL SHOCK WAVE LITHOTRIPSY: SHX1557

## 2016-04-29 SURGERY — LITHOTRIPSY, ESWL
Anesthesia: Moderate Sedation | Laterality: Right

## 2016-04-29 MED ORDER — HYDROCODONE-ACETAMINOPHEN 5-325 MG PO TABS: 1.0000 | ORAL_TABLET | Freq: Four times a day (QID) | ORAL | Status: DC | PRN

## 2016-04-29 MED ORDER — DIPHENHYDRAMINE HCL 25 MG PO CAPS
ORAL_CAPSULE | ORAL | Status: AC
  Administered 2016-04-29: 25 mg via ORAL
  Filled 2016-04-29: qty 1

## 2016-04-29 MED ORDER — CIPROFLOXACIN HCL 500 MG PO TABS
ORAL_TABLET | ORAL | Status: AC
  Administered 2016-04-29: 500 mg via ORAL
  Filled 2016-04-29: qty 1

## 2016-04-29 MED ORDER — CIPROFLOXACIN HCL 500 MG PO TABS
500.0000 mg | ORAL_TABLET | ORAL | Status: AC
  Administered 2016-04-29: 500 mg via ORAL

## 2016-04-29 MED ORDER — TAMSULOSIN HCL 0.4 MG PO CAPS: 0.4000 mg | ORAL_CAPSULE | Freq: Every day | ORAL | Status: DC

## 2016-04-29 MED ORDER — DIPHENHYDRAMINE HCL 25 MG PO CAPS
25.0000 mg | ORAL_CAPSULE | ORAL | Status: AC
  Administered 2016-04-29: 25 mg via ORAL

## 2016-04-29 MED ORDER — DOCUSATE SODIUM 100 MG PO CAPS: 100.0000 mg | ORAL_CAPSULE | Freq: Two times a day (BID) | ORAL | Status: DC

## 2016-04-29 MED ORDER — DIAZEPAM 5 MG PO TABS
ORAL_TABLET | ORAL | Status: AC
  Filled 2016-04-29: qty 2

## 2016-04-29 MED ORDER — DEXTROSE-NACL 5-0.45 % IV SOLN
INTRAVENOUS | Status: DC
  Administered 2016-04-29: 11:00:00 via INTRAVENOUS

## 2016-04-29 MED ORDER — DIAZEPAM 5 MG PO TABS
10.0000 mg | ORAL_TABLET | ORAL | Status: AC
  Administered 2016-04-29: 10 mg via ORAL

## 2016-04-29 NOTE — H&P (View-Only) (Signed)
5:06 PM   Jose [name] 03/27/1928 [id number]  Referring provider: Birdie Sons, MD [address] [place], [place] [address]  Chief Complaint  Patient presents with  . Results    CT    HPI: Patient is an 82 year old Caucasian male who presents today to discuss his CT renal stone study results that were ordered due to a possible right ureteral stone associated with right flank pain.  Background history Patient with a history of bilateral nephrolithiasis with sudden onset of right sided flank pain.  KUB taken on 04/12/2016 noted a possible distal third right ureteral stone and the stone over the lower pole of the left kidney.    His baseline urinary symptoms are frequent urination, urgency, nocturia, incontinence and urinary hesitancy.    He also has erectile dysfunction.  Today, patient is still experiencing right-sided flank pain. He is having associated worsening of his lower urinary tract symptoms. He has not passed a fragment. He denies dysuria and gross hematuria.  He is not had any recent fevers, chills, nausea or vomiting.  CT renal stone study completed on 04/15/2016 noted bilateral nephrolithiasis with a right UVJ stone approximately 4 mm in size causing mild hydroureter and pelvocaliectasis.  I have personally reviewed the films with the patient.   His UA today was unremarkable.   PMH: Past Medical History  Diagnosis Date  . Diverticulosis   . OSA (obstructive sleep apnea)   . Allergy   . Headache   . Nodular prostate with urinary obstruction   . Incomplete bladder emptying   . Organic impotence   . Elevated PSA   . Gross hematuria   . Sciatica   . Urinary frequency   . Renal colic   . Rosacea   . HTN (hypertension)   . Depression   . DDD (degenerative disc disease), lumbar   . Gynecomastia   . History of kidney stones   . BPH (benign prostatic hyperplasia)   . Chronic kidney disease     Surgical History: Past Surgical History    Procedure Laterality Date  . Cataract extraction Bilateral 07/2006  . Angioplasty  2001    PTCA and stenting of LAD by Dr. Clayborn Bigness  . Double ureter stent placement  08/1999    Double J Ureter stent placement  . Transurethral resection of prostate    . Doppler echocardiography  03/19/2013    Normal; Moderate global LV dysfunction. EF=45%. Mild Aortic insufficiency  . Sleep study  10/06/2011    Severe sleep apnea. AHI= 35.4 per hr. Done at [facility]  . Mri cervical spine wo contrast ([facility] hx)  01/28/2010    Abnormal Results; Arhritis and bulging discs. Referral to Neurosurgery  . Mri cervical spine wo contrast ([facility] hx)  10/19/2005    Abnormal, Bone spurs  . Ct scan of head  12/01/2004    Normal  . Spirometry  09/11/2007    Moderately Severe obstruction    Home Medications:    Medication List       This list is accurate as of: 04/16/16 11:59 PM.  Always use your most recent med list.               bisoprolol-hydrochlorothiazide 5-6.25 MG tablet  Commonly known as:  ZIAC  take 1 tablet by mouth once daily     gabapentin 100 MG capsule  Commonly known as:  NEURONTIN  Reported on 04/12/2016     PROBIOTIC ADVANCED PO  Take  by mouth.     simvastatin 40 MG tablet  Commonly known as:  ZOCOR  Take 1 tablet by mouth daily.     tamsulosin 0.4 MG Caps capsule  Commonly known as:  FLOMAX  Take 1 capsule (0.4 mg total) by mouth daily.        Allergies:  Allergies  Allergen Reactions  . Finasteride     Other reaction(s): Other (See Comments) PAIN IN BREAST    Family History: Family History  Problem Relation Age of Onset  . Heart disease Father   . Diabetes Father     type 2  . Hyperlipidemia Father   . Stroke Father   . Alcohol abuse Paternal Uncle   . Hypertension Other   . Breast cancer Other   . Lung cancer Other   . Kidney disease Father   . Prostate cancer Neg Hx     Social History:  reports that he quit smoking about 37 years ago. His  smoking use included Cigarettes. He has a 75 pack-year smoking history. He does not have any smokeless tobacco history on file. He reports that he does not drink alcohol or use illicit drugs.  ROS: UROLOGY Frequent Urination?: Yes Hard to postpone urination?: Yes Burning/pain with urination?: No Get up at night to urinate?: Yes Leakage of urine?: Yes Urine stream starts and stops?: Yes Trouble starting stream?: Yes Do you have to strain to urinate?: No Blood in urine?: No Urinary tract infection?: No Sexually transmitted disease?: No Injury to kidneys or bladder?: No Painful intercourse?: No Weak stream?: No Erection problems?: Yes Penile pain?: No  Gastrointestinal Nausea?: No Vomiting?: No Indigestion/heartburn?: No Diarrhea?: No Constipation?: Yes  Constitutional Fever: No Night sweats?: Yes Weight loss?: No Fatigue?: No  Skin Skin rash/lesions?: No Itching?: No  Eyes Blurred vision?: Yes Double vision?: Yes  Ears/Nose/Throat Sore throat?: No Sinus problems?: Yes  Hematologic/Lymphatic Swollen glands?: No Easy bruising?: No  Cardiovascular Leg swelling?: No Chest pain?: No  Respiratory Cough?: No Shortness of breath?: No  Endocrine Excessive thirst?: No  Musculoskeletal Back pain?: Yes Joint pain?: No  Neurological Headaches?: Yes Dizziness?: Yes  Psychologic Depression?: No Anxiety?: No  Physical Exam: BP 164/69 mmHg  Pulse 69  Ht _0  (1.778 m)  Wt 200 lb 1.6 oz (90.765 kg)  BMI 28.71 kg/m2  Constitutional: Well nourished. Alert and oriented, No acute distress. HEENT: [place] AT, moist mucus membranes. Trachea midline, no masses. Cardiovascular: No clubbing, cyanosis, or edema. Respiratory: Normal respiratory effort, no increased work of breathing. GI: Abdomen is soft, non tender, non distended, no abdominal masses. Liver and spleen not palpable.  No hernias appreciated.  Stool sample for occult testing is not indicated.   GU: No  CVA tenderness.  No bladder fullness or masses.   Skin: No rashes, bruises or suspicious lesions. Lymph: No cervical or inguinal adenopathy. Neurologic: Grossly intact, no focal deficits, moving all 4 extremities. Psychiatric: Normal mood and affect.  Laboratory Data: Lab Results  Component Value Date   WBC 6.9 03/27/2015   HGB 15.4 03/27/2015   HCT 46 03/27/2015   PLT 243 03/27/2015    Lab Results  Component Value Date   CREATININE 1.2 03/27/2015    Lab Results  Component Value Date   HGBA1C 5.9 02/13/2016    Lab Results  Component Value Date   TSH 4.50 03/27/2015       Component Value Date/Time   CHOL 123 03/27/2015   HDL 39 03/27/2015   LDLCALC 66  03/27/2015    Lab Results  Component Value Date   AST 19 03/27/2015   Lab Results  Component Value Date   ALT 11 03/27/2015     Urinalysis Microscopic Examination  Result Value Ref Range   WBC, UA 0-5 0 -  5 /hpf   RBC, UA 0-2 0 -  2 /hpf   Epithelial Cells (non renal) 0-10 0 - 10 /hpf   Bacteria, UA None seen None seen/Few  Urinalysis, Complete  Result Value Ref Range   Specific Gravity, UA 1.020 1.005 - 1.030   pH, UA 5.0 5.0 - 7.5   Color, UA Yellow Yellow   Appearance Ur Clear Clear   Leukocytes, UA Negative Negative   Protein, UA 1+ (A) Negative/Trace   Glucose, UA Negative Negative   Ketones, UA Negative Negative   RBC, UA Negative Negative   Bilirubin, UA Negative Negative   Urobilinogen, Ur 0.2 0.2 - 1.0 mg/dL   Nitrite, UA Negative Negative   Microscopic Examination See below:      Pertinent Imaging: CLINICAL DATA: Right flank pain and frequent urination.  EXAM: CT ABDOMEN AND PELVIS WITHOUT CONTRAST  TECHNIQUE: Multidetector CT imaging of the abdomen and pelvis was performed following the standard protocol without IV contrast.  COMPARISON: None  FINDINGS: Lower chest: No pleural fluid. The lung bases are clear.  Hepatobiliary: No suspicious liver abnormalities  identified. The gallbladder appears normal. There is no biliary dilatation.  Pancreas: No inflammation or mass identified.  Spleen: The spleen appears normal.  Adrenals/Urinary Tract: Normal adrenal glands. Bilateral renal cysts are identified. These are incompletely characterized without IV contrast material. Bilateral nephrolithiasis noted. There is a stone at the right UVJ which measures 4 mm, image 68 of series 2. This results in mild right hydroureter and pelvocaliectasis. Nonobstructing left renal calculi noted.  Stomach/Bowel: The stomach is within normal limits. The small bowel loops have a normal course and caliber. No obstruction. Numerous distal colonic diverticula identified without acute inflammation.  Vascular/Lymphatic: Calcified atherosclerotic disease involves the abdominal aorta. No aneurysm. No enlarged retroperitoneal or mesenteric adenopathy. No enlarged pelvic or inguinal lymph nodes.  Reproductive: Mild prostate gland enlargement.  Other: There is no ascites or focal fluid collections within the abdomen or pelvis.  Musculoskeletal: No suspicious lytic or sclerotic bone lesions identified.  IMPRESSION: 1. Bilateral nephrolithiasis. At the right UVJ there is a 4 mm calculus which causes mild hydroureter and pelvocaliectasis. 2. Aortic atherosclerosis.   Electronically Signed  By: Kerby Moors M.D.  On: 04/15/2016 16:16  Assessment & Plan:   Patient will be undergoing right ESWL for definitive treatment of a 4 mm UVJ stone. Patient has cardiac history and sleep apnea and we will be obtaining cardiac clearance prior to proceeding with the procedure.  1. Right UVJ stone:   Patient does not want to undergo MET for the right UVJ stone.  We discussed the risks, benefits and success rate for ESWL and URS/LL/ureteral stent placement.  Patient is desiring to undergo ESWL.  I explained to the patient how ESWL was performed and the risks involved.   Specifically, I stated that undergoing ESWL does not prevent pain with the passage of the stone.   ESWL breaks the stones into fragments and the patient still needs to pass these fragments which can be quite painful.  I also explained to the patient that if the ESWL was unsuccessful, he may need to undergo another procedure to rid himself of the stone burden.  He states his  understanding and is agreeable to undergo ESWL.  He will continue the tamsulosin 0.4 mg daily and push fluids. If he should pass the stone prior to his procedure today, he will contact the office.  If he should experience fevers/chills or intractable pain/vomiting, he should seek treatment in the emergency department.    - Urinalysis, Complete - CULTURE, URINE COMPREHENSIVE  2. Right flank pain:   Most likely caused by the right UVJ stone. He will be undergoing right ESWL in the future. We will continue to monitor.   He will seek treatment in the emergency room if pain becomes unbearable.     3. BPH with LUTS:   Patient does not find his urinary symptoms bothersome at this point.  We will continue to monitor.  4. Erectile dysfunction:   Patient's erectile dysfunction is not bothersome at this point.  We will continue to monitor.    5. Bilateral nephrolithiasis:   Bilateral nephrolithiasis confirmed with CT renal stone study. We will continue to monitor with annual KUB's.    Return for obtain cardiac clearance for right ESWL.  These notes generated with voice recognition software. I apologize for typographical errors.  Zara Council, [facility] [address], [place], [place] [address] [phone]

## 2016-04-29 NOTE — Interval H&P Note (Signed)
History and Physical Interval Note:  04/29/2016 10:39 AM  Jose HuhWilliam E Bockrath Jr.  has presented today for surgery, with the diagnosis of kidney stone  The various methods of treatment have been discussed with the patient and family. After consideration of risks, benefits and other options for treatment, the patient has consented to  Procedure(s): EXTRACORPOREAL SHOCK WAVE LITHOTRIPSY (ESWL) (Right) as a surgical intervention .  The patient's history has been reviewed, patient examined, no change in status, stable for surgery.  I have reviewed the patient's chart and labs.  Questions were answered to the patient's satisfaction.     Vanna ScotlandAshley Berl Bonfanti

## 2016-04-29 NOTE — Progress Notes (Signed)
Voided 100cc  Dark amber   Tiny granule obtained and saved for observation

## 2016-04-29 NOTE — Discharge Instructions (Addendum)
°  See [facility] discharge instructions in chart. ° ° °AMBULATORY SURGERY  °DISCHARGE INSTRUCTIONS ° ° °1) The drugs that you were given will stay in your system until tomorrow so for the next 24 hours you should not: ° °A) Drive an automobile °B) Make any legal decisions °C) Drink any alcoholic beverage ° ° °2) You may resume regular meals tomorrow.  Today it is better to start with liquids and gradually work up to solid foods. ° °You may eat anything you prefer, but it is better to start with liquids, then soup and crackers, and gradually work up to solid foods. ° ° °3) Please notify your doctor immediately if you have any unusual bleeding, trouble breathing, redness and pain at the surgery site, drainage, fever, or pain not relieved by medication. ° ° ° °4) Additional Instructions: ° ° ° ° ° ° ° °Please contact your physician with any problems or Same Day Surgery at [phone], Monday through Friday 6 am to 4 pm, or [place] [facility] [place] Main number at [phone].AMBULATORY SURGERY  °DISCHARGE INSTRUCTIONS ° ° °5) The drugs that you were given will stay in your system until tomorrow so for the next 24 hours you should not: ° °D) Drive an automobile °E) Make any legal decisions °F) Drink any alcoholic beverage ° ° °6) You may resume regular meals tomorrow.  Today it is better to start with liquids and gradually work up to solid foods. ° °You may eat anything you prefer, but it is better to start with liquids, then soup and crackers, and gradually work up to solid foods. ° ° °7) Please notify your doctor immediately if you have any unusual bleeding, trouble breathing, redness and pain at the surgery site, drainage, fever, or pain not relieved by medication. ° ° ° °8) Additional Instructions: ° ° ° ° ° ° ° °Please contact your physician with any problems or Same Day Surgery at [phone], Monday through Friday 6 am to 4 pm, or [place] [facility] [place] Main number at [phone]. °

## 2016-04-29 NOTE — Progress Notes (Signed)
No incision

## 2016-04-29 NOTE — Progress Notes (Signed)
Reddened area noted lower right buttock

## 2016-05-03 ENCOUNTER — Ambulatory Visit: Payer: Self-pay | Admitting: General Surgery

## 2016-05-13 ENCOUNTER — Ambulatory Visit: Payer: Self-pay | Admitting: General Surgery

## 2016-05-13 ENCOUNTER — Ambulatory Visit
Admission: RE | Admit: 2016-05-13 | Discharge: 2016-05-13 | Disposition: A | Discharge status: Home / Self Care | Payer: Medicare Other | Source: Ambulatory Visit | Attending: Urology | Admitting: Urology

## 2016-05-13 ENCOUNTER — Encounter: Payer: Self-pay | Admitting: Urology

## 2016-05-13 ENCOUNTER — Ambulatory Visit (INDEPENDENT_AMBULATORY_CARE_PROVIDER_SITE_OTHER): Payer: Medicare Other | Admitting: Urology

## 2016-05-13 VITALS — BP 145/64 | HR 65 | Ht 69.0 in | Wt 199.9 lb

## 2016-05-13 DIAGNOSIS — R3129 Other microscopic hematuria: Secondary | ICD-10-CM

## 2016-05-13 DIAGNOSIS — N200 Calculus of kidney: Secondary | ICD-10-CM | POA: Diagnosis not present

## 2016-05-13 DIAGNOSIS — N132 Hydronephrosis with renal and ureteral calculous obstruction: Secondary | ICD-10-CM

## 2016-05-13 DIAGNOSIS — N201 Calculus of ureter: Secondary | ICD-10-CM

## 2016-05-13 LAB — MICROSCOPIC EXAMINATION: BACTERIA UA: NONE SEEN

## 2016-05-13 LAB — URINALYSIS, COMPLETE
BILIRUBIN UA: NEGATIVE
GLUCOSE, UA: NEGATIVE
KETONES UA: NEGATIVE
LEUKOCYTES UA: NEGATIVE
Nitrite, UA: NEGATIVE
PH UA: 5 (ref 5.0–7.5)
SPEC GRAV UA: 1.025 (ref 1.005–1.030)
UUROB: 0.2 mg/dL (ref 0.2–1.0)

## 2016-05-13 NOTE — Progress Notes (Signed)
5:14 PM   Jose [name] 10/27/1928 [id number]  Referring provider: Malva Limes, MD [address] [place], [place] [address]  Chief Complaint  Patient presents with  . Follow-up    2 week post op ESWL with KUB prior    HPI: Patient is an 82 year old Caucasian male who is 2 weeks status post ESWL for a right ureteral stone.  CT renal stone study completed on 04/15/2016 noted bilateral nephrolithiasis with a right UVJ stone approximately 4 mm in size causing mild hydroureter and pyelocaliectasis.    Patient underwent right ESWL for definitive treatment for the 4 mm UVJ stone on 04/29/2016.  His postprocedural course was uneventful.  He denies any flank pain, gross hematuria or dysuria. He also denies any fevers, chills, nausea or vomiting.  He is experiencing nocturia and leakage of urine, but these are baseline.  UA today is positive for 3-10 rbc's per high-power field.  A KUB taken on 05/13/2016 noted that the right UVJ stone was no longer visible. Stable left lower pole stone. An no new urological calculus identified.  Personally reviewed the films with the patient.  PMH: Past Medical History  Diagnosis Date  . Diverticulosis   . OSA (obstructive sleep apnea)   . Allergy   . Headache   . Nodular prostate with urinary obstruction   . Incomplete bladder emptying   . Organic impotence   . Elevated PSA   . Gross hematuria   . Sciatica   . Urinary frequency   . Renal colic   . Rosacea   . HTN (hypertension)   . Depression   . DDD (degenerative disc disease), lumbar   . Gynecomastia   . History of kidney stones   . BPH (benign prostatic hyperplasia)   . Chronic kidney disease     Surgical History: Past Surgical History  Procedure Laterality Date  . Cataract extraction Bilateral 07/2006  . Angioplasty  2001    PTCA and stenting of LAD by Dr. Juliann Pares  . Double ureter stent placement  08/1999    Double J Ureter stent placement  .  Transurethral resection of prostate    . Doppler echocardiography  03/19/2013    Normal; Moderate global LV dysfunction. EF=45%. Mild Aortic insufficiency  . Sleep study  10/06/2011    Severe sleep apnea. AHI= 35.4 per hr. Done at [facility]  . Mri cervical spine wo contrast ([facility] hx)  01/28/2010    Abnormal Results; Arhritis and bulging discs. Referral to Neurosurgery  . Mri cervical spine wo contrast ([facility] hx)  10/19/2005    Abnormal, Bone spurs  . Ct scan of head  12/01/2004    Normal  . Spirometry  09/11/2007    Moderately Severe obstruction  . Extracorporeal shock wave lithotripsy Right 04/29/2016    Procedure: EXTRACORPOREAL SHOCK WAVE LITHOTRIPSY (ESWL);  Surgeon: Vanna Scotland, MD;  Location: [facility] ORS;  Service: Urology;  Laterality: Right;    Home Medications:    Medication List       This list is accurate as of: 05/13/16 11:59 PM.  Always use your most recent med list.               bisoprolol-hydrochlorothiazide 5-6.25 MG tablet  Commonly known as:  ZIAC  take 1 tablet by mouth once daily     docusate sodium 100 MG capsule  Commonly known as:  COLACE  Take 1 capsule (100 mg total) by mouth 2 (two) times daily.  gabapentin 100 MG capsule  Commonly known as:  NEURONTIN  Reported on 05/13/2016     HYDROcodone-acetaminophen 5-325 MG tablet  Commonly known as:  NORCO/VICODIN  Take 1-2 tablets by mouth every 6 (six) hours as needed for moderate pain.     PROBIOTIC ADVANCED PO  Take by mouth. Reported on 05/13/2016     simvastatin 40 MG tablet  Commonly known as:  ZOCOR  Take 1 tablet by mouth daily.     tamsulosin 0.4 MG Caps capsule  Commonly known as:  FLOMAX  Take 1 capsule (0.4 mg total) by mouth daily.     tamsulosin 0.4 MG Caps capsule  Commonly known as:  FLOMAX  Take 1 capsule (0.4 mg total) by mouth daily.        Allergies:  Allergies  Allergen Reactions  . Finasteride     Other reaction(s): Other (See Comments) PAIN IN BREAST     Family History: Family History  Problem Relation Age of Onset  . Heart disease Father   . Diabetes Father     type 2  . Hyperlipidemia Father   . Stroke Father   . Alcohol abuse Paternal Uncle   . Hypertension Other   . Breast cancer Other   . Lung cancer Other   . Kidney disease Father   . Prostate cancer Neg Hx     Social History:  reports that he quit smoking about 37 years ago. His smoking use included Cigarettes. He has a 75 pack-year smoking history. He does not have any smokeless tobacco history on file. He reports that he does not drink alcohol or use illicit drugs.  ROS: UROLOGY Frequent Urination?: No Hard to postpone urination?: No Burning/pain with urination?: No Get up at night to urinate?: Yes Leakage of urine?: Yes Urine stream starts and stops?: No Trouble starting stream?: No Do you have to strain to urinate?: No Blood in urine?: No Urinary tract infection?: No Sexually transmitted disease?: No Injury to kidneys or bladder?: No Painful intercourse?: No Weak stream?: No Erection problems?: No Penile pain?: No  Gastrointestinal Nausea?: No Vomiting?: No Indigestion/heartburn?: No Diarrhea?: No Constipation?: Yes  Constitutional Fever: No Night sweats?: No Weight loss?: No Fatigue?: No  Skin Skin rash/lesions?: No Itching?: No  Eyes Blurred vision?: No Double vision?: No  Ears/Nose/Throat Sore throat?: No Sinus problems?: No  Hematologic/Lymphatic Swollen glands?: No Easy bruising?: No  Cardiovascular Leg swelling?: No Chest pain?: No  Respiratory Cough?: Yes Shortness of breath?: No  Endocrine Excessive thirst?: No  Musculoskeletal Back pain?: Yes Joint pain?: No  Neurological Headaches?: Yes Dizziness?: No  Psychologic Depression?: No Anxiety?: No  Physical Exam: BP 145/64 mmHg  Pulse 65  Ht  (1.753 m)  Wt 199 lb 14.4 oz (90.674 kg)  BMI 29.51 kg/m2  Constitutional: Well nourished. Alert and  oriented, No acute distress. HEENT: [place] AT, moist mucus membranes. Trachea midline, no masses. Cardiovascular: No clubbing, cyanosis, or edema. Respiratory: Normal respiratory effort, no increased work of breathing. GI: Abdomen is soft, non tender, non distended, no abdominal masses. Liver and spleen not palpable.  No hernias appreciated.  Stool sample for occult testing is not indicated.   GU: No CVA tenderness.  No bladder fullness or masses.   Skin: No rashes, bruises or suspicious lesions. Lymph: No cervical or inguinal adenopathy. Neurologic: Grossly intact, no focal deficits, moving all 4 extremities. Psychiatric: Normal mood and affect.  Laboratory Data: Lab Results  Component Value Date   WBC 6.9 03/27/2015  HGB 15.4 03/27/2015   HCT 46 03/27/2015   PLT 243 03/27/2015    Lab Results  Component Value Date   CREATININE 1.2 03/27/2015    Lab Results  Component Value Date   HGBA1C 5.9 02/13/2016    Lab Results  Component Value Date   TSH 4.50 03/27/2015       Component Value Date/Time   CHOL 123 03/27/2015   HDL 39 03/27/2015   LDLCALC 66 03/27/2015    Lab Results  Component Value Date   AST 19 03/27/2015   Lab Results  Component Value Date   ALT 11 03/27/2015     Urinalysis Results for orders placed or performed in visit on 05/13/16  Microscopic Examination  Result Value Ref Range   WBC, UA 0-5 0 -  5 /hpf   RBC, UA 3-10 (A) 0 -  2 /hpf   Epithelial Cells (non renal) 0-10 0 - 10 /hpf   Bacteria, UA None seen None seen/Few  Urinalysis, Complete  Result Value Ref Range   Specific Gravity, UA 1.025 1.005 - 1.030   pH, UA 5.0 5.0 - 7.5   Color, UA Yellow Yellow   Appearance Ur Clear Clear   Leukocytes, UA Negative Negative   Protein, UA 1+ (A) Negative/Trace   Glucose, UA Negative Negative   Ketones, UA Negative Negative   RBC, UA Trace (A) Negative   Bilirubin, UA Negative Negative   Urobilinogen, Ur 0.2 0.2 - 1.0 mg/dL   Nitrite, UA  Negative Negative   Microscopic Examination See below:     Pertinent imaging CLINICAL DATA: 82 year old male with nephrolithiasis status post lithotripsy 2 weeks ago. Subsequent encounter.  EXAM: ABDOMEN - 1 VIEW  COMPARISON: 04/29/2016 and earlier.  FINDINGS: Small right intra renal calculus visible by CT Abdomen and Pelvis in April is not evident. Left lower pole nephrolithiasis is re- demonstrated and stable (arrow). Distal right ureteral calculus no longer evident. Superimposed bilateral pelvic vascular calcifications.  Non obstructed visible bowel gas pattern. Stable visualized osseous structures.  IMPRESSION: Right UVJ stone no longer visible. Stable left lower pole nephrolithiasis. No new urologic calculus identified.   Electronically Signed  By: Odessa FlemingH Hall M.D.  On: 05/13/2016 13:53  Assessment & Plan:   1. Right UVJ stone:   Patient underwent right ESWL on 04/29/2016 for definitive treatment of the stone.  It is no longer visible on today's KUB.    - Urinalysis, Complete  2. Right flank pain:   Resolved.    3. Hydronephrosis:   Patient was found to have right hydronephrosis due to an ureteral stone.  A RUS will be obtained to ensure the hydronephrosis has resolved.    4. BPH with LUTS:   Patient does not find his urinary symptoms bothersome at this point.  We will continue to monitor.  5. Erectile dysfunction:   Patient's erectile dysfunction is not bothersome at this point.  We will continue to monitor.    6. Bilateral nephrolithiasis:   Bilateral nephrolithiasis confirmed with CT renal stone study. We will continue to monitor with annual KUB's.    7. Microscopic hematuria:   We will continue to monitor the patient's UA after the treatment/passage of the stone to ensure the hematuria has resolved.  If hematuria persists, we will pursue a hematuria workup with CT Urogram and cystoscopy if appropriate.   Return in about 1 month (around 06/13/2016)  for RUS report and UA.  These notes generated with voice recognition software. I apologize for typographical  errors.  Zara Council, [facility] [address] [place], [place] [address] [phone]

## 2016-05-16 DIAGNOSIS — N132 Hydronephrosis with renal and ureteral calculous obstruction: Secondary | ICD-10-CM | POA: Insufficient documentation

## 2016-05-20 ENCOUNTER — Other Ambulatory Visit: Payer: Self-pay | Admitting: Urology

## 2016-06-07 ENCOUNTER — Ambulatory Visit
Admission: RE | Admit: 2016-06-07 | Discharge: 2016-06-07 | Disposition: A | Discharge status: Home / Self Care | Payer: Medicare Other | Source: Ambulatory Visit | Attending: Urology | Admitting: Urology

## 2016-06-07 DIAGNOSIS — N281 Cyst of kidney, acquired: Secondary | ICD-10-CM | POA: Insufficient documentation

## 2016-06-07 DIAGNOSIS — N261 Atrophy of kidney (terminal): Secondary | ICD-10-CM | POA: Insufficient documentation

## 2016-06-07 DIAGNOSIS — N200 Calculus of kidney: Secondary | ICD-10-CM | POA: Insufficient documentation

## 2016-06-14 ENCOUNTER — Ambulatory Visit (INDEPENDENT_AMBULATORY_CARE_PROVIDER_SITE_OTHER): Payer: Medicare Other | Admitting: Urology

## 2016-06-14 ENCOUNTER — Encounter: Payer: Self-pay | Admitting: Urology

## 2016-06-14 VITALS — BP 176/54 | HR 64 | Ht 70.0 in | Wt 201.0 lb

## 2016-06-14 DIAGNOSIS — R3129 Other microscopic hematuria: Secondary | ICD-10-CM

## 2016-06-14 DIAGNOSIS — N132 Hydronephrosis with renal and ureteral calculous obstruction: Secondary | ICD-10-CM | POA: Diagnosis not present

## 2016-06-14 DIAGNOSIS — N201 Calculus of ureter: Secondary | ICD-10-CM | POA: Diagnosis not present

## 2016-06-14 DIAGNOSIS — N200 Calculus of kidney: Secondary | ICD-10-CM

## 2016-06-14 DIAGNOSIS — Q6102 Congenital multiple renal cysts: Secondary | ICD-10-CM

## 2016-06-14 DIAGNOSIS — N281 Cyst of kidney, acquired: Secondary | ICD-10-CM

## 2016-06-14 LAB — URINALYSIS, COMPLETE
BILIRUBIN UA: NEGATIVE
GLUCOSE, UA: NEGATIVE
Ketones, UA: NEGATIVE
Leukocytes, UA: NEGATIVE
Nitrite, UA: NEGATIVE
PH UA: 5 (ref 5.0–7.5)
RBC UA: NEGATIVE
Specific Gravity, UA: 1.02 (ref 1.005–1.030)
UUROB: 0.2 mg/dL (ref 0.2–1.0)

## 2016-06-14 LAB — MICROSCOPIC EXAMINATION: Bacteria, UA: NONE SEEN

## 2016-06-14 NOTE — Progress Notes (Signed)
10:58 AM   Jose [name] 14-Feb-1928 [id number]  Referring provider: Malva Limes, MD [address] [place], [place] [address]  Chief Complaint  Patient presents with  . Results    46month follow up with u/s results    HPI: Patient is a 82 year old Caucasian male who presents today for a renal ultrasound report and a recheck on his urine for hematuria.  Background history CT renal stone study completed on 04/15/2016 noted bilateral nephrolithiasis with a right UVJ stone approximately 4 mm in size causing mild hydroureter and pyelocaliectasis.  Patient underwent right ESWL for definitive treatment for the 4 mm UVJ stone on 04/29/2016.  His postprocedural course was uneventful.  He denied any flank pain, gross hematuria or dysuria. He also denied any fevers, chills, nausea or vomiting.  He is experiencing nocturia and leakage of urine, but these are baseline.  Today, patient has no new complaints. He has not had any flank pain or gross hematuria. He's not had any fevers, chills, nausea or vomiting. His UA today is negative. His stone analysis noted a 91 % calcium oxalate monohydrate stone.  His renal ultrasound performed on 06/07/2016 noted mild bilateral renal cortical atrophy. No hydronephrosis or echogenic stone. Bilateral renal cysts. Smaller hypoechoic structures bilaterally which were not well characterized. Further evaluation with MRI or short-term follow-up with ultrasound in 3 months was recommended by radiologist. I reviewed the films with the patient.   PMH: Past Medical History  Diagnosis Date  . Diverticulosis   . OSA (obstructive sleep apnea)   . Allergy   . Headache   . Nodular prostate with urinary obstruction   . Incomplete bladder emptying   . Organic impotence   . Elevated PSA   . Gross hematuria   . Sciatica   . Urinary frequency   . Renal colic   . Rosacea   . HTN (hypertension)   . Depression   . DDD (degenerative disc disease),  lumbar   . Gynecomastia   . History of kidney stones   . BPH (benign prostatic hyperplasia)   . Chronic kidney disease     Surgical History: Past Surgical History  Procedure Laterality Date  . Cataract extraction Bilateral 07/2006  . Angioplasty  2001    PTCA and stenting of LAD by Dr. Juliann Pares  . Double ureter stent placement  08/1999    Double J Ureter stent placement  . Transurethral resection of prostate    . Doppler echocardiography  03/19/2013    Normal; Moderate global LV dysfunction. EF=45%. Mild Aortic insufficiency  . Sleep study  10/06/2011    Severe sleep apnea. AHI= 35.4 per hr. Done at [facility]  . Mri cervical spine wo contrast ([facility] hx)  01/28/2010    Abnormal Results; Arhritis and bulging discs. Referral to Neurosurgery  . Mri cervical spine wo contrast ([facility] hx)  10/19/2005    Abnormal, Bone spurs  . Ct scan of head  12/01/2004    Normal  . Spirometry  09/11/2007    Moderately Severe obstruction  . Extracorporeal shock wave lithotripsy Right 04/29/2016    Procedure: EXTRACORPOREAL SHOCK WAVE LITHOTRIPSY (ESWL);  Surgeon: Vanna Scotland, MD;  Location: [facility] ORS;  Service: Urology;  Laterality: Right;    Home Medications:    Medication List       This list is accurate as of: 06/14/16 10:58 AM.  Always use your most recent med list.  acetaminophen 325 MG tablet  Commonly known as:  TYLENOL  Take 650 mg by mouth every 6 (six) hours as needed.     bisoprolol-hydrochlorothiazide 5-6.25 MG tablet  Commonly known as:  ZIAC  take 1 tablet by mouth once daily     simvastatin 40 MG tablet  Commonly known as:  ZOCOR  Take 1 tablet by mouth daily.     tamsulosin 0.4 MG Caps capsule  Commonly known as:  FLOMAX  Take 1 capsule (0.4 mg total) by mouth daily.        Allergies:  Allergies  Allergen Reactions  . Finasteride     Other reaction(s): Other (See Comments) PAIN IN BREAST    Family History: Family History  Problem  Relation Age of Onset  . Heart disease Father   . Diabetes Father     type 2  . Hyperlipidemia Father   . Stroke Father   . Alcohol abuse Paternal Uncle   . Hypertension Other   . Breast cancer Other   . Lung cancer Other   . Kidney disease Father   . Prostate cancer Neg Hx     Social History:  reports that he quit smoking about 37 years ago. His smoking use included Cigarettes. He has a 75 pack-year smoking history. He does not have any smokeless tobacco history on file. He reports that he does not drink alcohol or use illicit drugs.  ROS: UROLOGY Frequent Urination?: Yes Hard to postpone urination?: No Burning/pain with urination?: No Get up at night to urinate?: Yes Leakage of urine?: Yes Urine stream starts and stops?: No Trouble starting stream?: No Do you have to strain to urinate?: No Blood in urine?: No Urinary tract infection?: No Sexually transmitted disease?: No Injury to kidneys or bladder?: No Painful intercourse?: No Weak stream?: No Erection problems?: No Penile pain?: No  Gastrointestinal Nausea?: No Vomiting?: No Indigestion/heartburn?: No Diarrhea?: No Constipation?: Yes  Constitutional Fever: No Night sweats?: No Weight loss?: No Fatigue?: No  Skin Skin rash/lesions?: No Itching?: No  Eyes Blurred vision?: No Double vision?: No  Ears/Nose/Throat Sore throat?: No Sinus problems?: No  Hematologic/Lymphatic Swollen glands?: No Easy bruising?: No  Cardiovascular Leg swelling?: No Chest pain?: No  Respiratory Cough?: No Shortness of breath?: No  Endocrine Excessive thirst?: No  Musculoskeletal Back pain?: Yes Joint pain?: No  Neurological Headaches?: Yes Dizziness?: No  Psychologic Depression?: No Anxiety?: No  Physical Exam: BP 176/54 mmHg  Pulse 64  Ht  (1.778 m)  Wt 201 lb (91.173 kg)  BMI 28.84 kg/m2  Constitutional: Well nourished. Alert and oriented, No acute distress. HEENT: [place] AT, moist mucus  membranes. Trachea midline, no masses. Cardiovascular: No clubbing, cyanosis, or edema. Respiratory: Normal respiratory effort, no increased work of breathing. Skin: No rashes, bruises or suspicious lesions. Lymph: No cervical or inguinal adenopathy. Neurologic: Grossly intact, no focal deficits, moving all 4 extremities. Psychiatric: Normal mood and affect.  Laboratory Data: Lab Results  Component Value Date   WBC 6.9 03/27/2015   HGB 15.4 03/27/2015   HCT 46 03/27/2015   PLT 243 03/27/2015    Lab Results  Component Value Date   CREATININE 1.2 03/27/2015    Lab Results  Component Value Date   HGBA1C 5.9 02/13/2016    Lab Results  Component Value Date   TSH 4.50 03/27/2015       Component Value Date/Time   CHOL 123 03/27/2015   HDL 39 03/27/2015   LDLCALC 66 03/27/2015  Lab Results  Component Value Date   AST 19 03/27/2015   Lab Results  Component Value Date   ALT 11 03/27/2015     Urinalysis Results for orders placed or performed in visit on 06/14/16  Microscopic Examination  Result Value Ref Range   WBC, UA 0-5 0 -  5 /hpf   RBC, UA 0-2 0 -  2 /hpf   Epithelial Cells (non renal) 0-10 0 - 10 /hpf   Mucus, UA Present (A) Not Estab.   Bacteria, UA None seen None seen/Few  Urinalysis, Complete  Result Value Ref Range   Specific Gravity, UA 1.020 1.005 - 1.030   pH, UA 5.0 5.0 - 7.5   Color, UA Yellow Yellow   Appearance Ur Clear Clear   Leukocytes, UA Negative Negative   Protein, UA 1+ (A) Negative/Trace   Glucose, UA Negative Negative   Ketones, UA Negative Negative   RBC, UA Negative Negative   Bilirubin, UA Negative Negative   Urobilinogen, Ur 0.2 0.2 - 1.0 mg/dL   Nitrite, UA Negative Negative   Microscopic Examination See below:     Pertinent imaging CLINICAL DATA: 82 year old male with kidney stones.  EXAM: RENAL / URINARY TRACT ULTRASOUND COMPLETE  COMPARISON: None.  FINDINGS: Right Kidney:  Length: 10.7 cm. There is  mild diffuse cortical thinning. No hydronephrosis. No echogenic stone identified. There is 3.4 x 3.4 x 2.9 cm inferior pole cyst. Place a 1.7 x 1.1 x 1.0 cm in hypoechoic focus in the upper pole of the right kidney is not well characterized but may represent a cyst. This can be further evaluated with MRI or short-term follow-up in 3 months recommended.  Left Kidney:  Length: 12 cm. There is no hydronephrosis or echogenic stone. There is mild diffuse cortical thinning. There is a 5.9 x 4.6 x 5.3 cm inferior pole cyst. A 1.4 x 1.2 cm hypoechoic structure in the inferior pole of the left kidney also likely represents a cyst. A 1.2 x 1.2 x 1.1 cm upper pole hypoechoic lesion is not well characterized but may represent a cyst. Stop  Bladder:  Appears normal for degree of bladder distention.  There is fatty infiltration of the liver.  IMPRESSION: Mild and bilateral renal cortical atrophy. No hydronephrosis or echogenic stone.  Bilateral renal cysts. Smaller hypoechoic structures bilaterally are not well characterized. Further evaluation with MRI or short-term follow-up with ultrasound in 3 months recommended.   Electronically Signed  By: Elgie CollardArash Radparvar M.D.  On: 06/07/2016 14:41  Assessment & Plan:   1. Right UVJ stone:   Patient underwent right ESWL on 04/29/2016 for definitive treatment of the stone.  It was no longer visible on today's KUB.    - Urinalysis, Complete  2. Right flank pain:   Resolved.    3. Hydronephrosis:    RUS obtained demonstrated that the hydronephrosis has resolved.    4. BPH with LUTS:   Patient does not find his urinary symptoms bothersome at this point.  We will continue to monitor.  5. Erectile dysfunction:   Patient's erectile dysfunction is not bothersome at this point.  We will continue to monitor.    6. Bilateral nephrolithiasis:   Bilateral nephrolithiasis confirmed with CT renal stone study. We will continue to monitor with  annual KUB's.    7. Microscopic hematuria:   UA is negative for hematuria. Patient does not report any gross hematuria. We will continue to monitor  8. Renal lesions:   Patient with bilateral hypoechoic structures  found on renal ultrasound. We discussed undergoing an MRI or a short-term follow-up with ultrasound in 3 months. He has chosen the latter and will return in 3 months for renal ultrasound and report.   Return in about 3 months (around 09/14/2016) for RUS report.  These notes generated with voice recognition software. I apologize for typographical errors.  Michiel Cowboy, PA-C  [facility] [address] [place], [place] [address] [phone]

## 2016-06-18 ENCOUNTER — Ambulatory Visit (INDEPENDENT_AMBULATORY_CARE_PROVIDER_SITE_OTHER): Payer: Medicare Other | Admitting: Family Medicine

## 2016-06-18 ENCOUNTER — Other Ambulatory Visit: Payer: Self-pay | Admitting: Family Medicine

## 2016-06-18 ENCOUNTER — Encounter: Payer: Self-pay | Admitting: Family Medicine

## 2016-06-18 VITALS — BP 140/58 | HR 73 | Temp 98.3°F | Resp 18 | Wt 202.0 lb

## 2016-06-18 DIAGNOSIS — G44229 Chronic tension-type headache, not intractable: Secondary | ICD-10-CM | POA: Diagnosis not present

## 2016-06-18 DIAGNOSIS — E785 Hyperlipidemia, unspecified: Secondary | ICD-10-CM | POA: Diagnosis not present

## 2016-06-18 DIAGNOSIS — N183 Chronic kidney disease, stage 3 unspecified: Secondary | ICD-10-CM

## 2016-06-18 DIAGNOSIS — I10 Essential (primary) hypertension: Secondary | ICD-10-CM

## 2016-06-18 MED ORDER — TOPIRAMATE 25 MG PO TABS: ORAL_TABLET | ORAL | Status: DC
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
## 2016-06-19 LAB — LIPID PANEL
CHOLESTEROL TOTAL: 114 mg/dL (ref 100–199)
Chol/HDL Ratio: 3.4 ratio units (ref 0.0–5.0)
HDL: 34 mg/dL — ABNORMAL LOW (ref >39)
LDL CALC: 55 mg/dL (ref 0–99)
Triglycerides: 123 mg/dL (ref 0–149)
VLDL CHOLESTEROL CAL: 25 mg/dL (ref 5–40)

## 2016-06-19 LAB — HEPATIC FUNCTION PANEL
ALBUMIN: 4.1 g/dL (ref 3.5–4.7)
ALT: 12 IU/L (ref 0–44)
AST: 15 IU/L (ref 0–40)
Alkaline Phosphatase: 40 IU/L (ref 39–117)
BILIRUBIN TOTAL: 0.5 mg/dL (ref 0.0–1.2)
Bilirubin, Direct: 0.15 mg/dL (ref 0.00–0.40)
TOTAL PROTEIN: 6.7 g/dL (ref 6.0–8.5)

## 2016-06-20 LAB — RENAL FUNCTION PANEL
ALBUMIN: 4 g/dL (ref 3.5–4.7)
BUN / CREAT RATIO: 17 (ref 10–24)
BUN: 22 mg/dL (ref 8–27)
CALCIUM: 9 mg/dL (ref 8.6–10.2)
CHLORIDE: 102 mmol/L (ref 96–106)
CO2: 26 mmol/L (ref 18–29)
CREATININE: 1.28 mg/dL — AB (ref 0.76–1.27)
GFR calc Af Amer: 58 mL/min/{1.73_m2} — ABNORMAL LOW (ref >59)
GFR calc non Af Amer: 50 mL/min/{1.73_m2} — ABNORMAL LOW (ref >59)
Glucose: 116 mg/dL — ABNORMAL HIGH (ref 65–99)
Phosphorus: 3.4 mg/dL (ref 2.5–4.5)
Potassium: 4 mmol/L (ref 3.5–5.2)
Sodium: 143 mmol/L (ref 134–144)

## 2016-06-21 ENCOUNTER — Other Ambulatory Visit: Payer: Self-pay | Admitting: *Deleted

## 2016-06-21 NOTE — Telephone Encounter (Signed)
Patient stated that the Topamax rx he was prescribed on 06/18/2016 is going to be about 10 days short of having enough to last until the end of there month.

## 2016-06-21 NOTE — Telephone Encounter (Signed)
We only prescribed enough for him to titrate dose from 25 mg a day to 50mg  twice a day. There is a refill on current prescription to get by until follow up appointment. Alternatively, we can change to the 50mg  tablet when the current prescription runs out, he will just need to call and let us a now  A few days before he needs new prescription sent in.

## 2016-06-22 NOTE — Telephone Encounter (Signed)
Patient advised and verbally voiced understanding.  

## 2016-06-22 NOTE — Telephone Encounter (Signed)
Left message to call back  

## 2016-07-22 ENCOUNTER — Telehealth: Payer: Self-pay | Admitting: Family Medicine

## 2016-07-22 DIAGNOSIS — G44229 Chronic tension-type headache, not intractable: Secondary | ICD-10-CM

## 2016-07-22 MED ORDER — TOPIRAMATE 50 MG PO TABS: 50.0000 mg | ORAL_TABLET | Freq: Two times a day (BID) | ORAL | 3 refills | Status: DC

## 2016-07-22 NOTE — Telephone Encounter (Signed)
Pt is requesting refill of topiramate (TOPAMAX) He has worked his way up to a higher MG and needs new RX for the new dosage and want a call about this.

## 2016-07-22 NOTE — Telephone Encounter (Signed)
Rx sent for topirimate 50mg  1 tab PO BID to [facility] as per Dr. Theodis Aguas note for increase

## 2016-07-22 NOTE — Telephone Encounter (Signed)
I looked in patient chart and saw that on 06/21/2016 Dr. Sherrie Mustache stated that he would change to the 50mg  tablet when the current prescription runs out. Patient was advised to call and let us a now  a few days before he needs new prescription sent in. Patient is calling needing a new prescription for the higher dose.

## 2016-08-18 ENCOUNTER — Encounter: Payer: Self-pay | Admitting: Urology

## 2016-08-18 ENCOUNTER — Ambulatory Visit (INDEPENDENT_AMBULATORY_CARE_PROVIDER_SITE_OTHER): Payer: Medicare Other | Admitting: Urology

## 2016-08-18 VITALS — BP 153/78 | HR 66 | Ht 69.0 in | Wt 196.1 lb

## 2016-08-18 DIAGNOSIS — R300 Dysuria: Secondary | ICD-10-CM | POA: Diagnosis not present

## 2016-08-18 LAB — MICROSCOPIC EXAMINATION: Bacteria, UA: NONE SEEN

## 2016-08-18 LAB — URINALYSIS, COMPLETE
BILIRUBIN UA: NEGATIVE
GLUCOSE, UA: NEGATIVE
KETONES UA: NEGATIVE
LEUKOCYTES UA: NEGATIVE
Nitrite, UA: NEGATIVE
SPEC GRAV UA: 1.02 (ref 1.005–1.030)
Urobilinogen, Ur: 0.2 mg/dL (ref 0.2–1.0)
pH, UA: 5.5 (ref 5.0–7.5)

## 2016-08-18 MED ORDER — AMOXICILLIN-POT CLAVULANATE 875-125 MG PO TABS: 1.0000 | ORAL_TABLET | Freq: Two times a day (BID) | ORAL | 0 refills | Status: DC

## 2016-08-18 NOTE — Progress Notes (Signed)
11:00 AM   Jose HuhWilliam E Oglesby Jr. 06/29/1928 [id number]  Referring provider: Malva Limesonald E Fisher, MD [address] [place], [place] [id number]  Chief Complaint  Patient presents with  . Urinary Tract Infection    patient thinks he has an uti    HPI: Patient is a 82 year old Caucasian male who presents today for evaluation for a possible UTI.    Background history CT renal stone study completed on 04/15/2016 noted bilateral nephrolithiasis with a right UVJ stone approximately 4 mm in size causing mild hydroureter and pyelocaliectasis.  Patient underwent right ESWL for definitive treatment for the 4 mm UVJ stone on 04/29/2016.  His postprocedural course was uneventful.  He denied any flank pain, gross hematuria or dysuria. He also denied any fevers, chills, nausea or vomiting.   Follow up RUS noted resolution of the hydronephrosis.    He is experiencing nocturia and leakage of urine, but these are baseline.  Today, he states the urine has been dark, a film on the toilet and a foul odor.  He is also having urinary frequency.  He is not experiencing dysuria, suprapubic pain or gross hematuria.  He has not had a fevers, chills, nausea or vomiting.  He has been having malaise.    Patient has bilateral nephrolithiasis.  He has not had flank pain or passage of any fragments.    PMH: Past Medical History:  Diagnosis Date  . Allergy   . BPH (benign prostatic hyperplasia)   . Chronic kidney disease   . DDD (degenerative disc disease), lumbar   . Depression   . Diverticulosis   . Elevated PSA   . Gross hematuria   . Gynecomastia   . Headache   . History of kidney stones   . HTN (hypertension)   . Incomplete bladder emptying   . Nodular prostate with urinary obstruction   . Organic impotence   . OSA (obstructive sleep apnea)   . Renal colic   . Rosacea   . Sciatica   . Urinary frequency     Surgical History: Past Surgical History:  Procedure Laterality Date  .  ANGIOPLASTY  2001   PTCA and stenting of LAD by Dr. Juliann Paresallwood  . CATARACT EXTRACTION Bilateral 07/2006  . CT Scan of head  12/01/2004   Normal  . DOPPLER ECHOCARDIOGRAPHY  03/19/2013   Normal; Moderate global LV dysfunction. EF=45%. Mild Aortic insufficiency  . Double Ureter stent placement  08/1999   Double J Ureter stent placement  . EXTRACORPOREAL SHOCK WAVE LITHOTRIPSY Right 04/29/2016   Procedure: EXTRACORPOREAL SHOCK WAVE LITHOTRIPSY (ESWL);  Surgeon: Vanna ScotlandAshley Brandon, MD;  Location: [facility] ORS;  Service: Urology;  Laterality: Right;  . MRI CERVICAL SPINE WO CONTRAST ([facility] HX)  01/28/2010   Abnormal Results; Arhritis and bulging discs. Referral to Neurosurgery  . MRI CERVICAL SPINE WO CONTRAST ([facility] HX)  10/19/2005   Abnormal, Bone spurs  . Sleep study  10/06/2011   Severe sleep apnea. AHI= 35.4 per hr. Done at [facility]  . SPIROMETRY  09/11/2007   Moderately Severe obstruction  . TRANSURETHRAL RESECTION OF PROSTATE      Home Medications:    Medication List       Accurate as of 08/18/16 11:00 AM. Always use your most recent med list.          acetaminophen 500 MG tablet Commonly known as:  TYLENOL Take 500 mg by mouth every 8 (eight) hours as needed.   amoxicillin-clavulanate 875-125 MG tablet  Commonly known as:  AUGMENTIN Take 1 tablet by mouth every 12 (twelve) hours.   bisoprolol-hydrochlorothiazide 5-6.25 MG tablet Commonly known as:  ZIAC take 1 tablet by mouth once daily   gabapentin 100 MG capsule Commonly known as:  NEURONTIN   simvastatin 40 MG tablet Commonly known as:  ZOCOR Take 1 tablet by mouth daily.   tamsulosin 0.4 MG Caps capsule Commonly known as:  FLOMAX Take 1 capsule (0.4 mg total) by mouth daily.   topiramate 50 MG tablet Commonly known as:  TOPAMAX Take 1 tablet (50 mg total) by mouth 2 (two) times daily.       Allergies:  Allergies  Allergen Reactions  . Finasteride     Other reaction(s): Other (See Comments) PAIN IN  BREAST    Family History: Family History  Problem Relation Age of Onset  . Heart disease Father   . Diabetes Father     type 2  . Hyperlipidemia Father   . Stroke Father   . Kidney disease Father   . Hypertension Other   . Breast cancer Other   . Lung cancer Other   . Alcohol abuse Paternal Uncle   . Prostate cancer Neg Hx     Social History:  reports that he quit smoking about 37 years ago. His smoking use included Cigarettes. He has a 75.00 pack-year smoking history. He has never used smokeless tobacco. He reports that he does not drink alcohol or use drugs.  ROS: UROLOGY Frequent Urination?: Yes Hard to postpone urination?: No Burning/pain with urination?: No Get up at night to urinate?: No Leakage of urine?: No Urine stream starts and stops?: No Trouble starting stream?: No Do you have to strain to urinate?: No Blood in urine?: No Urinary tract infection?: Yes Sexually transmitted disease?: No Injury to kidneys or bladder?: No Painful intercourse?: No Weak stream?: No Erection problems?: No Penile pain?: No  Gastrointestinal Nausea?: No Vomiting?: No Indigestion/heartburn?: No Diarrhea?: No Constipation?: No  Constitutional Fever: No Night sweats?: No Weight loss?: No Fatigue?: No  Skin Skin rash/lesions?: No Itching?: No  Eyes Blurred vision?: No Double vision?: No  Ears/Nose/Throat Sore throat?: No Sinus problems?: No  Hematologic/Lymphatic Swollen glands?: No Easy bruising?: No  Cardiovascular Leg swelling?: No Chest pain?: No  Respiratory Cough?: No Shortness of breath?: No  Endocrine Excessive thirst?: No  Musculoskeletal Back pain?: No Joint pain?: No  Neurological Headaches?: No Dizziness?: No  Psychologic Depression?: No Anxiety?: No  Physical Exam: BP (!) 153/78   Pulse 66   Ht 5\' 9"  (1.753 m)   Wt 196 lb 1.6 oz (89 kg)   BMI 28.96 kg/m   Constitutional: Well nourished. Alert and oriented, No acute  distress. HEENT: [place] AT, moist mucus membranes. Trachea midline, no masses. Cardiovascular: No clubbing, cyanosis, or edema. Respiratory: Normal respiratory effort, no increased work of breathing. Skin: No rashes, bruises or suspicious lesions. Lymph: No cervical or inguinal adenopathy. Neurologic: Grossly intact, no focal deficits, moving all 4 extremities. Psychiatric: Normal mood and affect.  Laboratory Data: Lab Results  Component Value Date   WBC 6.9 03/27/2015   HGB 15.4 03/27/2015   HCT 46 03/27/2015   PLT 243 03/27/2015    Lab Results  Component Value Date   CREATININE 1.28 (H) 06/18/2016    Lab Results  Component Value Date   HGBA1C 5.9 02/13/2016    Lab Results  Component Value Date   TSH 4.50 03/27/2015       Component Value Date/Time  CHOL 114 06/18/2016 1109   HDL 34 (L) 06/18/2016 1109   CHOLHDL 3.4 06/18/2016 1109   LDLCALC 55 06/18/2016 1109    Lab Results  Component Value Date   AST 15 06/18/2016   Lab Results  Component Value Date   ALT 12 06/18/2016     Urinalysis Significant for 3-10 RBC's/hpf.  See EPIC.     Assessment & Plan:   1. Microscopic hematuria  - 3 to 10 RBC's on today's UA  - has bilateral stones  - send for culture as patient complains of malaise and a foul odor to urine  - start Augmentin empirically, change antibiotic if needed when culture results available  - patient instructed to call on Monday for results  - Urinalysis, Complete  - advised to contact our office or seek treatment in the ED if becomes febrile or pain/ vomiting are difficult control in order to arrange for emergent/urgent intervention  2. Right UVJ stone:   Patient underwent right ESWL on 04/29/2016 for definitive treatment of the stone.  It was no longer visible follow up KUB.    3. Hydronephrosis:    RUS obtained demonstrated that the hydronephrosis has resolved.    4. BPH with LUTS:   Patient does not find his urinary symptoms bothersome at  this point.  We will continue to monitor.  5. Erectile dysfunction:   Patient's erectile dysfunction is not bothersome at this point.  We will continue to monitor.    6. Bilateral nephrolithiasis:   Bilateral nephrolithiasis confirmed with CT renal stone study. We will continue to monitor with annual KUB's.    7. Renal lesions:   Patient with bilateral hypoechoic structures found on renal ultrasound. We discussed undergoing an MRI or a short-term follow-up with ultrasound in 3 months. He has chosen the latter and will return in September 2017 for renal ultrasound and report.   Return for keep appointment in September.  These notes generated with voice recognition software. I apologize for typographical errors.  Michiel Cowboy, PA-C  [facility] [address] [place], [place] [address] [phone]

## 2016-08-19 ENCOUNTER — Telehealth: Payer: Self-pay | Admitting: Family Medicine

## 2016-08-19 NOTE — Telephone Encounter (Signed)
Unable to reach pt and unable to leave message. Will try again later.

## 2016-08-19 NOTE — Telephone Encounter (Signed)
Pt would like a call back to discuss stopping the topiramate (TOPAMAX) 50 MG tablet and switching to something else. Pt stated he was given a form at the pharmacy about possible reactions and he thinks he is already having the reactions to his eyes and kidneys. Pt request to discuss this with a nurse. Thanks TNP

## 2016-08-20 ENCOUNTER — Other Ambulatory Visit: Payer: Self-pay | Admitting: Family Medicine

## 2016-08-20 LAB — CULTURE, URINE COMPREHENSIVE

## 2016-08-20 MED ORDER — NORTRIPTYLINE HCL 10 MG PO CAPS: 10.0000 mg | ORAL_CAPSULE | Freq: Every day | ORAL | 3 refills | Status: DC

## 2016-08-20 NOTE — Telephone Encounter (Signed)
Pt reports that he was told by pharmacist to ask about getting off Topamax 50 mg due to possible side effects. Patient reports that he is having left eye problems that require injections. Pt reports [facility]. Patient also reports that medication is causing him to have kidney stones. Patient reports that he had a stone removed one month ago, but still has 3 or 4 more stones. Patient reports that he is taking Topamax 25 mg BID. Patient would like to know if he can just stop taking or decrease dose again before completely stopping. sd

## 2016-08-20 NOTE — Telephone Encounter (Signed)
Pt reports he would like a different medication because he is having recurring headaches even on the lower dose Topamax 25 mg BID. Is taking Tylenol, with relief. Please advise. Allene DillonEmily Drozdowski, CMA

## 2016-08-20 NOTE — Telephone Encounter (Signed)
He doesn't have to wean off, he can just stop taken it. Let me know if the headaches get bad again and we try a different medication.

## 2016-08-20 NOTE — Progress Notes (Signed)
Have sent rx for nortriptyline to pharmacy. Start off by taking one capsules at bedtime, if still having headaches after 4 days, he can increase to 2 at bedtime.

## 2016-08-24 ENCOUNTER — Telehealth: Payer: Self-pay | Admitting: *Deleted

## 2016-08-24 NOTE — Telephone Encounter (Signed)
Spoke with patient and let him know his urine culture did not grow out any bacteria. Patient understands.

## 2016-08-24 NOTE — Telephone Encounter (Signed)
-----   Message from Mervin Kungamona K Brannon Decaire, [place] sent at 08/24/2016  9:02 AM EDT ----- No Growth. Inform Patient.

## 2016-09-07 ENCOUNTER — Ambulatory Visit
Admission: RE | Admit: 2016-09-07 | Discharge: 2016-09-07 | Disposition: A | Discharge status: Home / Self Care | Payer: Medicare Other | Source: Ambulatory Visit | Attending: Urology | Admitting: Urology

## 2016-09-07 DIAGNOSIS — Q6102 Congenital multiple renal cysts: Secondary | ICD-10-CM | POA: Insufficient documentation

## 2016-09-07 DIAGNOSIS — G319 Degenerative disease of nervous system, unspecified: Secondary | ICD-10-CM | POA: Diagnosis not present

## 2016-09-07 DIAGNOSIS — N281 Cyst of kidney, acquired: Secondary | ICD-10-CM

## 2016-09-14 ENCOUNTER — Ambulatory Visit (INDEPENDENT_AMBULATORY_CARE_PROVIDER_SITE_OTHER): Payer: Medicare Other | Admitting: Urology

## 2016-09-14 ENCOUNTER — Encounter: Payer: Self-pay | Admitting: Urology

## 2016-09-14 VITALS — BP 177/65 | HR 71 | Ht 69.0 in | Wt 197.0 lb

## 2016-09-14 DIAGNOSIS — N200 Calculus of kidney: Secondary | ICD-10-CM

## 2016-09-14 DIAGNOSIS — G8929 Other chronic pain: Secondary | ICD-10-CM

## 2016-09-14 DIAGNOSIS — M25552 Pain in left hip: Secondary | ICD-10-CM

## 2016-09-14 DIAGNOSIS — N401 Enlarged prostate with lower urinary tract symptoms: Secondary | ICD-10-CM | POA: Diagnosis not present

## 2016-09-14 DIAGNOSIS — Q6102 Congenital multiple renal cysts: Secondary | ICD-10-CM

## 2016-09-14 DIAGNOSIS — N138 Other obstructive and reflux uropathy: Secondary | ICD-10-CM

## 2016-09-14 LAB — BLADDER SCAN AMB NON-IMAGING

## 2016-09-14 NOTE — Progress Notes (Signed)
9:39 AM   Simona HuhWilliam E Copelin Jr. 11/09/1928 [id number]  Referring provider: Malva Limesonald E Fisher, MD [address] [place], [place] [id number]  Chief Complaint  Patient presents with  . Results    54month u/s results    HPI: Patient is a 82 year old Caucasian male who presents today for to discuss his renal ultrasound results.    Background history CT renal stone study completed on 04/15/2016 noted bilateral nephrolithiasis with a right UVJ stone approximately 4 mm in size causing mild hydroureter and pyelocaliectasis.  Patient underwent right ESWL for definitive treatment for the 4 mm UVJ stone on 04/29/2016.  Follow up RUS noted resolution of the hydronephrosis.    He is experiencing nocturia and leakage of urine, but these are baseline.  Today, he states the urine has been dark, has a foul odor and bubbly looking in the morning.  He also complains of a weak stream and feeling like he does not empty his bladder.  He has pain that begins in his left buttock and radiates up his lower back and through his left hip.  This has been occurring for several months.  He is not experiencing dysuria, suprapubic pain or gross hematuria.  He has not had a fevers, chills, nausea or vomiting.    He has a history of elevated PSA listed in his PMH, but I cannot find any prior PSA's.  He also has a history of DDD of the lumbar spine.  CT Renal stone study and subsequent KUB's have not demonstrated any lytic lesions.    Patient has bilateral nephrolithiasis.  He has not had flank pain or passage of any fragments.  On RUS completed on 06/07/2016 noted smaller hypoechoic structures bilaterally were not well characterize.  Repeated RUS on 09/07/2016 noted no change in these lesions.  I have independently reviewed the films.      PMH: Past Medical History:  Diagnosis Date  . Allergy   . BPH (benign prostatic hyperplasia)   . Chronic kidney disease   . DDD (degenerative disc disease), lumbar   .  Depression   . Diverticulosis   . Elevated PSA   . Gross hematuria   . Gynecomastia   . Headache   . History of kidney stones   . HTN (hypertension)   . Incomplete bladder emptying   . Nodular prostate with urinary obstruction   . Organic impotence   . OSA (obstructive sleep apnea)   . Renal colic   . Rosacea   . Sciatica   . Urinary frequency     Surgical History: Past Surgical History:  Procedure Laterality Date  . ANGIOPLASTY  2001   PTCA and stenting of LAD by Dr. Juliann Paresallwood  . CATARACT EXTRACTION Bilateral 07/2006  . CT Scan of head  12/01/2004   Normal  . DOPPLER ECHOCARDIOGRAPHY  03/19/2013   Normal; Moderate global LV dysfunction. EF=45%. Mild Aortic insufficiency  . Double Ureter stent placement  08/1999   Double J Ureter stent placement  . EXTRACORPOREAL SHOCK WAVE LITHOTRIPSY Right 04/29/2016   Procedure: EXTRACORPOREAL SHOCK WAVE LITHOTRIPSY (ESWL);  Surgeon: Vanna ScotlandAshley Brandon, MD;  Location: [facility] ORS;  Service: Urology;  Laterality: Right;  . MRI CERVICAL SPINE WO CONTRAST ([facility] HX)  01/28/2010   Abnormal Results; Arhritis and bulging discs. Referral to Neurosurgery  . MRI CERVICAL SPINE WO CONTRAST ([facility] HX)  10/19/2005   Abnormal, Bone spurs  . Sleep study  10/06/2011   Severe sleep apnea. AHI= 35.4 per hr.  Done at [facility]  . SPIROMETRY  09/11/2007   Moderately Severe obstruction  . TRANSURETHRAL RESECTION OF PROSTATE      Home Medications:    Medication List       Accurate as of 09/14/16  9:39 AM. Always use your most recent med list.          acetaminophen 500 MG tablet Commonly known as:  TYLENOL Take 500 mg by mouth every 8 (eight) hours as needed.   bisoprolol-hydrochlorothiazide 5-6.25 MG tablet Commonly known as:  ZIAC take 1 tablet by mouth once daily   simvastatin 40 MG tablet Commonly known as:  ZOCOR Take 1 tablet by mouth daily.   tamsulosin 0.4 MG Caps capsule Commonly known as:  FLOMAX Take 1 capsule (0.4 mg total) by  mouth daily.       Allergies:  Allergies  Allergen Reactions  . Finasteride     Other reaction(s): Other (See Comments) PAIN IN BREAST    Family History: Family History  Problem Relation Age of Onset  . Heart disease Father   . Diabetes Father     type 2  . Hyperlipidemia Father   . Stroke Father   . Kidney disease Father   . Hypertension Other   . Breast cancer Other   . Lung cancer Other   . Alcohol abuse Paternal Uncle   . Prostate cancer Neg Hx     Social History:  reports that he quit smoking about 37 years ago. His smoking use included Cigarettes. He has a 75.00 pack-year smoking history. He has never used smokeless tobacco. He reports that he does not drink alcohol or use drugs.  ROS: UROLOGY Frequent Urination?: Yes Hard to postpone urination?: No Burning/pain with urination?: No Get up at night to urinate?: Yes Leakage of urine?: Yes Urine stream starts and stops?: No Trouble starting stream?: No Do you have to strain to urinate?: No Blood in urine?: No Urinary tract infection?: No Sexually transmitted disease?: No Injury to kidneys or bladder?: No Painful intercourse?: No Weak stream?: No Erection problems?: No Penile pain?: No  Gastrointestinal Nausea?: No Vomiting?: No Indigestion/heartburn?: No Diarrhea?: No Constipation?: No  Constitutional Fever: No Night sweats?: No Weight loss?: No Fatigue?: Yes  Skin Skin rash/lesions?: No Itching?: No  Eyes Blurred vision?: No Double vision?: No  Ears/Nose/Throat Sore throat?: No Sinus problems?: No  Hematologic/Lymphatic Swollen glands?: No Easy bruising?: No  Cardiovascular Leg swelling?: No Chest pain?: No  Respiratory Cough?: No Shortness of breath?: No  Endocrine Excessive thirst?: No  Musculoskeletal Back pain?: Yes Joint pain?: No  Neurological Headaches?: No Dizziness?: No  Psychologic Depression?: No Anxiety?: No  Physical Exam: BP (!) 177/65   Pulse 71    Ht 5\' 9"  (1.753 m)   Wt 197 lb (89.4 kg)   BMI 29.09 kg/m   Constitutional: Well nourished. Alert and oriented, No acute distress. HEENT: [place] AT, moist mucus membranes. Trachea midline, no masses. Cardiovascular: No clubbing, cyanosis, or edema. Respiratory: Normal respiratory effort, no increased work of breathing. GI: Abdomen is soft, non tender, non distended, no abdominal masses. Liver and spleen not palpable.  No hernias appreciated.  Stool sample for occult testing is not indicated.   GU: No CVA tenderness.  No bladder fullness or masses.  Patient with uncircumcised phallus. Foreskin easily retracted Urethral meatus is patent.  No penile discharge. No penile lesions or rashes. Scrotum without lesions, cysts, rashes and/or edema.  Testicles are located scrotally bilaterally. No masses are appreciated  in the testicles. Left and right epididymis are normal. Rectal: Patient with  normal sphincter tone. Anus and perineum without scarring or rashes. No rectal masses are appreciated. Prostate is approximately 55 grams, no nodules are appreciated. Seminal vesicles are normal. Skin: No rashes, bruises or suspicious lesions. Lymph: No cervical or inguinal adenopathy. Neurologic: Grossly intact, no focal deficits, moving all 4 extremities. Psychiatric: Normal mood and affect.  Laboratory Data: Lab Results  Component Value Date   WBC 6.9 03/27/2015   HGB 15.4 03/27/2015   HCT 46 03/27/2015   PLT 243 03/27/2015    Lab Results  Component Value Date   CREATININE 1.28 (H) 06/18/2016    Lab Results  Component Value Date   HGBA1C 5.9 02/13/2016    Lab Results  Component Value Date   TSH 4.50 03/27/2015       Component Value Date/Time   CHOL 114 06/18/2016 1109   HDL 34 (L) 06/18/2016 1109   CHOLHDL 3.4 06/18/2016 1109   LDLCALC 55 06/18/2016 1109    Lab Results  Component Value Date   AST 15 06/18/2016   Lab Results  Component Value Date   ALT 12 06/18/2016      Pertinent Imaging Results for NEILAN, RIZZO (MRN [id number]) as of 09/14/2016 10:03  Ref. Range 09/14/2016 09:52  Scan Result Unknown 12ml   Assessment & Plan:   1. Renal lesions:   Patient with bilateral hypoechoic structures found on renal ultrasound. We repeat RUS in 6 months.   2. BPH with LUTS  - patient discontinued his tamsulosin as he thought he may have been causing his back pain  - pain did not resolve with the stopping of the tamsulosin  3. Bilateral nephrolithiasis:   Bilateral nephrolithiasis confirmed with CT renal stone study. We will continue to monitor with annual KUB's.    4. Left hip pain  - patient has a history of DDD of the lumbar spine  - obtain a PSA today to evaluate for possible metastatic prostate cancer  - explained to the patient that we would pursue a palliative course for prostate cancer vs a curative course if prostate cancer is discovered  Return in about 6 months (around 03/14/2017) for IPSS, RUS report  and exam.  These notes generated with voice recognition software. I apologize for typographical errors.  Michiel Cowboy, PA-C  [facility] [address] [place], [place] [address] [phone]

## 2016-09-15 LAB — PSA: Prostate Specific Ag, Serum: 1.3 ng/mL (ref 0.0–4.0)

## 2016-09-16 ENCOUNTER — Other Ambulatory Visit: Payer: Self-pay | Admitting: Family Medicine

## 2016-09-16 ENCOUNTER — Telehealth: Payer: Self-pay

## 2016-09-16 NOTE — Telephone Encounter (Signed)
Pt called back and I read message from [place] to him.

## 2016-09-16 NOTE — Telephone Encounter (Signed)
LMOM

## 2016-09-16 NOTE — Telephone Encounter (Signed)
-----   Message from Harle BattiestShannon A McGowan, PA-C sent at 09/15/2016  7:29 PM EDT ----- Patient's PSA is normal.  His pain in his back is not coming from his prostate.

## 2016-09-17 NOTE — Telephone Encounter (Signed)
LMOM

## 2016-10-15 ENCOUNTER — Ambulatory Visit (INDEPENDENT_AMBULATORY_CARE_PROVIDER_SITE_OTHER): Payer: Medicare Other | Admitting: Family Medicine

## 2016-10-15 ENCOUNTER — Encounter: Payer: Self-pay | Admitting: Family Medicine

## 2016-10-15 VITALS — BP 140/62 | HR 68 | Temp 97.6°F | Ht 68.5 in | Wt 197.0 lb

## 2016-10-15 DIAGNOSIS — Z0000 Encounter for general adult medical examination without abnormal findings: Secondary | ICD-10-CM | POA: Diagnosis not present

## 2016-10-15 DIAGNOSIS — Z23 Encounter for immunization: Secondary | ICD-10-CM

## 2016-10-15 DIAGNOSIS — M545 Low back pain, unspecified: Secondary | ICD-10-CM

## 2016-10-15 MED ORDER — PREDNISONE 10 MG PO TABS: ORAL_TABLET | ORAL | 0 refills | Status: AC

## 2016-10-15 NOTE — Patient Instructions (Signed)
Jose [name] , Thank you for taking time to come for your Medicare Wellness Visit. I appreciate your ongoing commitment to your health goals. Please review the following plan we discussed and let me know if I can assist you in the future.   These are the goals we discussed: Goals    . Increase water intake          Starting 10/15/16, I will continue to drink 6-8 glasses of water a day.       This is a list of the screening recommended for you and due dates:  Health Maintenance  Topic Date Due  . Tetanus Vaccine  10/15/2017*  . Flu Shot  Completed  . Shingles Vaccine  Completed  . Pneumonia vaccines  Completed  *Topic was postponed. The date shown is not the original due date.  Preventive Care for Adults  A healthy lifestyle and preventive care can promote health and wellness. Preventive health guidelines for adults include the following key practices.  . A routine yearly physical is a good way to check with your health care provider about your health and preventive screening. It is a chance to share any concerns and updates on your health and to receive a thorough exam.  . Visit your dentist for a routine exam and preventive care every 6 months. Brush your teeth twice a day and floss once a day. Good oral hygiene prevents tooth decay and gum disease.  . The frequency of eye exams is based on your age, health, family medical history, use  of contact lenses, and other factors. Follow your health care provider's ecommendations for frequency of eye exams.  . Eat a healthy diet. Foods like vegetables, fruits, whole grains, low-fat dairy products, and lean protein foods contain the nutrients you need without too many calories. Decrease your intake of foods high in solid fats, added sugars, and salt. Eat the right amount of calories for you. Get information about a proper diet from your health care provider, if necessary.  . Regular physical exercise is one of the most important things you  can do for your health. Most adults should get at least 150 minutes of moderate-intensity exercise (any activity that increases your heart rate and causes you to sweat) each week. In addition, most adults need muscle-strengthening exercises on 2 or more days a week.  Silver Sneakers may be a benefit available to you. To determine eligibility, you may visit the website: www.silversneakers.com or contact program at [phone] Mon-Fri between 8AM-8PM.   . Maintain a healthy weight. The body mass index (BMI) is a screening tool to identify possible weight problems. It provides an estimate of body fat based on height and weight. Your health care provider can find your BMI and can help you achieve or maintain a healthy weight.   For adults 20 years and older: ? A BMI below 18.5 is considered underweight. ? A BMI of 18.5 to 24.9 is normal. ? A BMI of 25 to 29.9 is considered overweight. ? A BMI of 30 and above is considered obese.   . Maintain normal blood lipids and cholesterol levels by exercising and minimizing your intake of saturated fat. Eat a balanced diet with plenty of fruit and vegetables. Blood tests for lipids and cholesterol should begin at age 80 and be repeated every 5 years. If your lipid or cholesterol levels are high, you are over 50, or you are at high risk for heart disease, you may need your cholesterol levels checked  more frequently. Ongoing high lipid and cholesterol levels should be treated with medicines if diet and exercise are not working.  . If you smoke, find out from your health care provider how to quit. If you do not use tobacco, please do not start.  . If you choose to drink alcohol, please do not consume more than 2 drinks per day. One drink is considered to be 12 ounces (355 mL) of beer, 5 ounces (148 mL) of wine, or 1.5 ounces (44 mL) of liquor.  . If you are 52-37 years old, ask your health care provider if you should take aspirin to prevent strokes.  . Use  sunscreen. Apply sunscreen liberally and repeatedly throughout the day. You should seek shade when your shadow is shorter than you. Protect yourself by wearing long sleeves, pants, a wide-brimmed hat, and sunglasses year round, whenever you are outdoors.  . Once a month, do a whole body skin exam, using a mirror to look at the skin on your back. Tell your health care provider of new moles, moles that have irregular borders, moles that are larger than a pencil eraser, or moles that have changed in shape or color.

## 2016-10-15 NOTE — Progress Notes (Signed)
       Patient: Jose HuhWilliam E Slemmer Jr. Male    DOB: 10/01/1928   82 y.o.   MRN: [id number] Visit Date: 10/15/2016  Today's Provider: Mila Merryonald Fisher, MD   Chief Complaint  Patient presents with  . Back Pain   Subjective:    Back Pain  This is a new problem. Episode onset: 4 weeks ago. The problem occurs intermittently. The problem is unchanged. The pain is present in the lumbar spine. The quality of the pain is described as aching. Radiates to: down left leg down to his ankles. The symptoms are aggravated by lying down and twisting (also walking uphill). Associated symptoms include headaches, leg pain and weakness. Pertinent negatives include no abdominal pain, bladder incontinence, bowel incontinence, chest pain, dysuria, fever, numbness, paresthesias, tingling or weight loss. Treatments tried: Tylenol.   It not at all like pain he had with kidney stone earlier this year. No relief with Tylenol. No known injuries, no falls.     Allergies  Allergen Reactions  . Finasteride     Other reaction(s): Other (See Comments) PAIN IN BREAST     Current Outpatient Prescriptions:  .  acetaminophen (TYLENOL) 500 MG tablet, Take 500 mg by mouth every 8 (eight) hours as needed., Disp: , Rfl:  .  bisoprolol-hydrochlorothiazide (ZIAC) 5-6.25 MG tablet, take 1 tablet by mouth once daily, Disp: 30 tablet, Rfl: 8 .  simvastatin (ZOCOR) 40 MG tablet, Take 1 tablet by mouth daily., Disp: , Rfl: 0 .  tamsulosin (FLOMAX) 0.4 MG CAPS capsule, Take 1 capsule (0.4 mg total) by mouth daily. (Patient not taking: Reported on 10/15/2016), Disp: 30 capsule, Rfl: 0  Review of Systems  Constitutional: Positive for fatigue. Negative for appetite change, chills, fever and weight loss.  Respiratory: Negative for chest tightness, shortness of breath and wheezing.   Cardiovascular: Negative for chest pain and palpitations.  Gastrointestinal: Negative for abdominal pain, bowel incontinence, nausea and vomiting.    Genitourinary: Negative for bladder incontinence and dysuria.  Musculoskeletal: Positive for back pain.  Neurological: Positive for weakness and headaches. Negative for tingling, numbness and paresthesias.    Social History  Substance Use Topics  . Smoking status: Former Smoker    Packs/day: 3.00    Years: 25.00    Types: Cigarettes    Quit date: 12/27/1978  . Smokeless tobacco: Never Used  . Alcohol use No   Objective:   BP 140/62 (BP Location: Left Arm, Patient Position: Sitting, Cuff Size: Large)   Pulse 68   Temp 97.6 F (36.4 C) (Oral)   Ht 5' 8.5" (1.74 m)   Wt 197 lb (89.4 kg)   SpO2 96% Comment: room air  BMI 29.52 kg/m   Physical Exam  Musculoskeletal:       Back:    General appearance: alert, well developed, well nourished, cooperative and in no distress Head: Normocephalic, without obvious abnormality, atraumatic Respiratory: Respirations even and unlabored, normal respiratory rate Neuro: +5 leg strength both extremities DTRs symmetric    Assessment & Plan:     1. Low back pain potentially associated with radiculopathy  - predniSONE (DELTASONE) 10 MG tablet; 4 tablets for 4 days, then 3 for 4 days, then 2 for 4 days, then 1 for 4 days  Dispense: 40 tablet; Refill: 0  He is to call if not greatly improved next week. Consider Xr LS spine      Mila Merryonald Fisher, MD  [facility]

## 2016-10-15 NOTE — Progress Notes (Signed)
Subjective:   Jose HuhWilliam E Melena Jr. is a 82 y.o. male who presents for Medicare Annual/Subsequent preventive examination.  Review of Systems:   Cardiac Risk Factors include: advanced age (>5655men, 59>65 women);dyslipidemia;hypertension;male gender     Objective:    Vitals: BP 140/62   Pulse 68   Temp 97.6 F (36.4 C)   Ht 5' 8.5" (1.74 m)   Wt 197 lb (89.4 kg)   BMI 29.52 kg/m   Body mass index is 29.52 kg/m.  Tobacco History  Smoking Status  . Former Smoker  . Packs/day: 3.00  . Years: 25.00  . Types: Cigarettes  . Quit date: 12/27/1978  Smokeless Tobacco  . Never Used     Counseling given: No   Past Medical History:  Diagnosis Date  . Allergy   . BPH (benign prostatic hyperplasia)   . Chronic kidney disease   . DDD (degenerative disc disease), lumbar   . Depression   . Diverticulosis   . Elevated PSA   . Gross hematuria   . Gynecomastia   . Headache   . History of kidney stones   . HTN (hypertension)   . Incomplete bladder emptying   . Nodular prostate with urinary obstruction   . Organic impotence   . OSA (obstructive sleep apnea)   . Renal colic   . Rosacea   . Sciatica   . Urinary frequency    Past Surgical History:  Procedure Laterality Date  . ANGIOPLASTY  2001   PTCA and stenting of LAD by Dr. Juliann Paresallwood  . CATARACT EXTRACTION Bilateral 07/2006  . CT Scan of head  12/01/2004   Normal  . DOPPLER ECHOCARDIOGRAPHY  03/19/2013   Normal; Moderate global LV dysfunction. EF=45%. Mild Aortic insufficiency  . Double Ureter stent placement  08/1999   Double J Ureter stent placement  . EXTRACORPOREAL SHOCK WAVE LITHOTRIPSY Right 04/29/2016   Procedure: EXTRACORPOREAL SHOCK WAVE LITHOTRIPSY (ESWL);  Surgeon: Vanna ScotlandAshley Brandon, MD;  Location: [facility] ORS;  Service: Urology;  Laterality: Right;  . MRI CERVICAL SPINE WO CONTRAST ([facility] HX)  01/28/2010   Abnormal Results; Arhritis and bulging discs. Referral to Neurosurgery  . MRI CERVICAL SPINE WO CONTRAST ([facility] HX)   10/19/2005   Abnormal, Bone spurs  . Sleep study  10/06/2011   Severe sleep apnea. AHI= 35.4 per hr. Done at [facility]  . SPIROMETRY  09/11/2007   Moderately Severe obstruction  . TRANSURETHRAL RESECTION OF PROSTATE     Family History  Problem Relation Age of Onset  . Heart disease Father   . Diabetes Father     type 2  . Hyperlipidemia Father   . Stroke Father   . Kidney disease Father   . Hypertension Other   . Breast cancer Other   . Lung cancer Other   . Alcohol abuse Paternal Uncle   . Cancer Sister   . Cancer Son   . Prostate cancer Neg Hx    History  Sexual Activity  . Sexual activity: Not on file    Outpatient Encounter Prescriptions as of 10/15/2016  Medication Sig  . acetaminophen (TYLENOL) 500 MG tablet Take 500 mg by mouth every 8 (eight) hours as needed.  . bisoprolol-hydrochlorothiazide (ZIAC) 5-6.25 MG tablet take 1 tablet by mouth once daily  . simvastatin (ZOCOR) 40 MG tablet Take 1 tablet by mouth daily.  . tamsulosin (FLOMAX) 0.4 MG CAPS capsule Take 1 capsule (0.4 mg total) by mouth daily. (Patient not taking: Reported on 10/15/2016)   No  facility-administered encounter medications on file as of 10/15/2016.     Activities of Daily Living In your present state of health, do you have any difficulty performing the following activities: 10/15/2016  Hearing? Y  Vision? N  Difficulty concentrating or making decisions? Y  Walking or climbing stairs? N  Dressing or bathing? N  Doing errands, shopping? N  Preparing Food and eating ? N  Using the Toilet? N  In the past six months, have you accidently leaked urine? Y  Do you have problems with loss of bowel control? N  Managing your Medications? N  Managing your Finances? N  Housekeeping or managing your Housekeeping? Y  Some recent data might be hidden    Patient Care Team: Malva Limes, MD as PCP - General (Family Medicine) Smith Robert, MD (Urology) Yevonne Pax, MD as Consulting  Physician (Internal Medicine) Murray Hodgkins, MD as Referring Physician (Ophthalmology) Alwyn Pea, MD as Consulting Physician (Cardiology) Linus Salmons, MD as Consulting Physician (Otolaryngology)   Assessment:     Exercise Activities and Dietary recommendations Current Exercise Habits: The patient does not participate in regular exercise at present, Exercise limited by: None identified (does not have time)  Goals    . Increase water intake          Starting 10/15/16, I will continue to drink 6-8 glasses of water a day.      Fall Risk Fall Risk  10/15/2016  Falls in the past year? Yes  Number falls in past yr: 1  Injury with Fall? No  Risk for fall due to : Impaired balance/gait   Depression Screen PHQ 2/9 Scores 10/15/2016  PHQ - 2 Score 0    Cognitive Function     6CIT Screen 10/15/2016  What Year? 0 points  What month? 0 points  What time? 0 points  Count back from 20 0 points  Months in reverse 0 points  Repeat phrase 2 points  Total Score 2    Immunization History  Administered Date(s) Administered  . Influenza, High Dose Seasonal PF 09/20/2015, 10/15/2016  . Pneumococcal Conjugate-13 05/07/2014  . Pneumococcal Polysaccharide-23 11/04/2004   Screening Tests Health Maintenance  Topic Date Due  . TETANUS/TDAP  10/15/2017 (Originally 04/30/1947)  . INFLUENZA VACCINE  Completed  . ZOSTAVAX  Completed  . PNA vac Low Risk Adult  Completed      Plan:    I have personally reviewed and addressed the Medicare Annual Wellness questionnaire and have noted the following in the patient's chart:  A. Medical and social history B. Use of alcohol, tobacco or illicit drugs  C. Current medications and supplements D. Functional ability and status E.  Nutritional status F.  Physical activity G. Advance directives H. List of other physicians I.  Hospitalizations, surgeries, and ER visits in previous 12 months J.  Vitals K. Screenings such as hearing and  vision if needed, cognitive and depression L. Referrals and appointments - none  In addition, I have reviewed and discussed with patient certain preventive protocols, quality metrics, and best practice recommendations. A written personalized care plan for preventive services as well as general preventive health recommendations were provided to patient.  See attached scanned questionnaire for additional information.   Signed,  Hyacinth Meeker, LPN Nurse Health Advisor  I have reviewed the health advisor's note, was available for consultation, and agree with documentation and plan  Mila Merry, MD

## 2016-12-10 ENCOUNTER — Ambulatory Visit
Admission: RE | Admit: 2016-12-10 | Discharge: 2016-12-10 | Disposition: A | Discharge status: Home / Self Care | Payer: Medicare Other | Source: Ambulatory Visit | Attending: Family Medicine | Admitting: Family Medicine

## 2016-12-10 ENCOUNTER — Encounter: Payer: Self-pay | Admitting: Family Medicine

## 2016-12-10 ENCOUNTER — Ambulatory Visit (INDEPENDENT_AMBULATORY_CARE_PROVIDER_SITE_OTHER): Payer: Medicare Other | Admitting: Family Medicine

## 2016-12-10 VITALS — BP 122/62 | HR 61 | Temp 98.3°F | Resp 14 | Wt 198.6 lb

## 2016-12-10 DIAGNOSIS — M8580 Other specified disorders of bone density and structure, unspecified site: Secondary | ICD-10-CM | POA: Insufficient documentation

## 2016-12-10 DIAGNOSIS — M25552 Pain in left hip: Secondary | ICD-10-CM | POA: Insufficient documentation

## 2016-12-10 NOTE — Progress Notes (Signed)
Patient: Jose HuhWilliam E Bloodsworth Jr. Male    DOB: 11/12/1928   82 y.o.   MRN: [id number] Visit Date: 12/10/2016  Today's Provider: Dortha Kernennis Ladelle Teodoro, PA   Chief Complaint  Patient presents with  . Hip Pain   Subjective:    Hip Pain   The incident occurred more than 1 week ago. There was no injury mechanism. The pain is present in the left hip. The quality of the pain is described as aching. The pain is at a severity of 9/10. The pain has been constant since onset. The symptoms are aggravated by weight bearing. Treatments tried: on 10/20 prescribed Prednisone. The treatment provided significant relief.   Past Medical History:  Diagnosis Date  . Allergy   . BPH (benign prostatic hyperplasia)   . Chronic kidney disease   . DDD (degenerative disc disease), lumbar   . Depression   . Diverticulosis   . Elevated PSA   . Gross hematuria   . Gynecomastia   . Headache   . History of kidney stones   . HTN (hypertension)   . Incomplete bladder emptying   . Nodular prostate with urinary obstruction   . Organic impotence   . OSA (obstructive sleep apnea)   . Renal colic   . Rosacea   . Sciatica   . Urinary frequency    Past Surgical History:  Procedure Laterality Date  . ANGIOPLASTY  2001   PTCA and stenting of LAD by Dr. Juliann Pares[place]wood  . CATARACT EXTRACTION Bilateral 07/2006  . CT Scan of head  12/01/2004   Normal  . DOPPLER ECHOCARDIOGRAPHY  03/19/2013   Normal; Moderate global LV dysfunction. EF=45%. Mild Aortic insufficiency  . Double Ureter stent placement  08/1999   Double J Ureter stent placement  . EXTRACORPOREAL SHOCK WAVE LITHOTRIPSY Right 04/29/2016   Procedure: EXTRACORPOREAL SHOCK WAVE LITHOTRIPSY (ESWL);  Surgeon: Vanna ScotlandAshley Brandon, MD;  Location: [facility] ORS;  Service: Urology;  Laterality: Right;  . MRI CERVICAL SPINE WO CONTRAST ([facility] HX)  01/28/2010   Abnormal Results; Arhritis and bulging discs. Referral to Neurosurgery  . MRI CERVICAL SPINE WO CONTRAST ([facility] HX)  10/19/2005   Abnormal, Bone spurs  . Sleep study  10/06/2011   Severe sleep apnea. AHI= 35.4 per hr. Done at [facility]  . SPIROMETRY  09/11/2007   Moderately Severe obstruction  . TRANSURETHRAL RESECTION OF PROSTATE     Family History  Problem Relation Age of Onset  . Heart disease Father   . Diabetes Father     type 2  . Hyperlipidemia Father   . Stroke Father   . Kidney disease Father   . Hypertension Other   . Breast cancer Other   . Lung cancer Other   . Alcohol abuse Paternal Uncle   . Cancer Sister   . Cancer Son   . Prostate cancer Neg Hx    Allergies  Allergen Reactions  . Finasteride     Other reaction(s): Other (See Comments) PAIN IN BREAST     Previous Medications   ACETAMINOPHEN (TYLENOL) 500 MG TABLET    Take 500 mg by mouth every 8 (eight) hours as needed.   BISOPROLOL-HYDROCHLOROTHIAZIDE (ZIAC) 5-6.25 MG TABLET    take 1 tablet by mouth once daily   SIMVASTATIN (ZOCOR) 40 MG TABLET    Take 1 tablet by mouth daily.   TAMSULOSIN (FLOMAX) 0.4 MG CAPS CAPSULE    Take 1 capsule (0.4 mg total) by mouth daily.    Review of Systems  Constitutional:  Negative.   Respiratory: Negative.   Cardiovascular: Negative.   Musculoskeletal: Positive for arthralgias.    Social History  Substance Use Topics  . Smoking status: Former Smoker    Packs/day: 3.00    Years: 25.00    Types: Cigarettes    Quit date: 12/27/1978  . Smokeless tobacco: Never Used  . Alcohol use No   Objective:   BP 122/62 (BP Location: Right Arm, Patient Position: Sitting, Cuff Size: Normal)   Pulse 61   Temp 98.3 F (36.8 C) (Oral)   Resp 14   Wt 198 lb 9.6 oz (90.1 kg)   BMI 29.76 kg/m   Physical Exam  Constitutional: He is oriented to person, place, and time. He appears well-developed and well-nourished.  HENT:  Head: Normocephalic.  Eyes: Conjunctivae are normal.  Neck: Neck supple.  Cardiovascular: Normal rate and regular rhythm.   Pulmonary/Chest: Effort normal and breath sounds  normal.  Musculoskeletal: He exhibits tenderness.  Left buttock near sacrum. Increased discomfort to rotate left hip internally with knee flexed. Symmetric pedal pulses without edema.  Neurological: He is alert and oriented to person, place, and time.      Assessment & Plan:     1. Left hip pain Onset over the past 2 month. Has tried tylenol. Recommend moist heat and change to Aleve BID. Will get x-ray to rule out degenerative arthritis. May be some referral of pain from lower back. No known injury. Recheck pending x-ray report. May need orthopedic referral. - DG HIP UNILAT WITH PELVIS 2-3 VIEWS LEFT; Future

## 2016-12-13 ENCOUNTER — Telehealth: Payer: Self-pay

## 2016-12-13 NOTE — Telephone Encounter (Signed)
LMTCB

## 2016-12-13 NOTE — Telephone Encounter (Signed)
Patient advised.

## 2016-12-13 NOTE — Telephone Encounter (Signed)
-----   Message from Tamsen Roersennis E Chrismon, [place] sent at 12/11/2016 11:22 AM EST ----- X-ray confirms arthritis in hips. May need orthopedic referral if no better with use of Aleve twice a day. Call report of response in 10-14 days.

## 2016-12-30 ENCOUNTER — Ambulatory Visit: Payer: Medicare Other | Admitting: Family Medicine

## 2017-02-02 ENCOUNTER — Ambulatory Visit
Admission: RE | Admit: 2017-02-02 | Discharge: 2017-02-02 | Disposition: A | Discharge status: Home / Self Care | Payer: Medicare HMO | Source: Ambulatory Visit | Attending: Urology | Admitting: Urology

## 2017-02-02 DIAGNOSIS — N281 Cyst of kidney, acquired: Secondary | ICD-10-CM | POA: Diagnosis not present

## 2017-02-02 DIAGNOSIS — Q6102 Congenital multiple renal cysts: Secondary | ICD-10-CM | POA: Insufficient documentation

## 2017-02-03 ENCOUNTER — Telehealth: Payer: Self-pay | Admitting: Family Medicine

## 2017-02-04 DIAGNOSIS — M25532 Pain in left wrist: Secondary | ICD-10-CM | POA: Diagnosis not present

## 2017-02-04 DIAGNOSIS — M25531 Pain in right wrist: Secondary | ICD-10-CM | POA: Diagnosis not present

## 2017-02-04 DIAGNOSIS — M545 Low back pain: Secondary | ICD-10-CM | POA: Diagnosis not present

## 2017-02-24 ENCOUNTER — Telehealth: Payer: Self-pay

## 2017-02-24 NOTE — Telephone Encounter (Signed)
Called patient to see if his insurance has changed at all. We have him down to see Mckenzie at 1pm tomorrow (02/25/2017), and it looks like he has already done Wellness visit in October. Patient does not need another wellness visit if he has the same insurance.   However, patient may keep the F/U appt with Dr. Sherrie MustacheFisher as he is due for recheck on HTN and cholesterol.

## 2017-02-25 ENCOUNTER — Encounter: Payer: Self-pay | Admitting: Family Medicine

## 2017-02-25 ENCOUNTER — Ambulatory Visit (INDEPENDENT_AMBULATORY_CARE_PROVIDER_SITE_OTHER): Payer: Medicare HMO | Admitting: Family Medicine

## 2017-02-25 ENCOUNTER — Ambulatory Visit: Payer: Medicare Other

## 2017-02-25 VITALS — BP 138/60 | HR 62 | Temp 98.2°F | Resp 16 | Wt 198.0 lb

## 2017-02-25 DIAGNOSIS — I10 Essential (primary) hypertension: Secondary | ICD-10-CM

## 2017-02-25 DIAGNOSIS — I2510 Atherosclerotic heart disease of native coronary artery without angina pectoris: Secondary | ICD-10-CM

## 2017-02-25 DIAGNOSIS — M19049 Primary osteoarthritis, unspecified hand: Secondary | ICD-10-CM

## 2017-02-25 DIAGNOSIS — I739 Peripheral vascular disease, unspecified: Secondary | ICD-10-CM | POA: Diagnosis not present

## 2017-02-25 DIAGNOSIS — M25552 Pain in left hip: Secondary | ICD-10-CM | POA: Diagnosis not present

## 2017-02-25 DIAGNOSIS — H34832 Tributary (branch) retinal vein occlusion, left eye, with macular edema: Secondary | ICD-10-CM | POA: Diagnosis not present

## 2017-02-25 MED ORDER — NAPROXEN 500 MG PO TABS: 500.0000 mg | ORAL_TABLET | Freq: Two times a day (BID) | ORAL | 0 refills | Status: DC

## 2017-02-25 NOTE — Progress Notes (Signed)
Patient: Jose [name]. Male    DOB: 06-25-1928   83 y.o.   MRN: [id number] Visit Date: 02/25/2017  Today's Provider: Mila Merry, MD   Chief Complaint  Patient presents with  . Hypertension   Subjective:    HPI  Hypertension, follow-up:  BP Readings from Last 3 Encounters:  02/25/17 138/60  12/10/16 122/62  10/15/16 140/62    He was last seen for hypertension 8 months ago.  BP at that visit was 122/62. Management since that visit includes none. He reports good compliance with treatment. He is not having side effects.  He is not exercising. He is adherent to low salt diet.   Outside blood pressures are not being checked. He is experiencing none.  Patient denies chest pain, chest pressure/discomfort, claudication, dyspnea, exertional chest pressure/discomfort, fatigue, irregular heart beat, lower extremity edema, near-syncope, orthopnea and palpitations.   Cardiovascular risk factors include advanced age (older than 34 for men, 32 for women), dyslipidemia, hypertension, male gender and smoking/ tobacco exposure.   Wt Readings from Last 3 Encounters:  02/25/17 198 lb (89.8 kg)  12/10/16 198 lb 9.6 oz (90.1 kg)  10/15/16 197 lb (89.4 kg)   ------------------------------------------------------------------------  He continues to have persistent pain in left hip. He had XR in December showing OA in hip. He has also had trouble with fingers getting sore and stiff, particularly in the morning when he cannot fully extend digits.   Additionally, ABIs were done as part of home health assessment in September with right ABI=0.45  And left ABI= 0.63. He states he does have pain in the backs of calves and thighs after walking for a few minutes, particularly up a ramp.     Allergies  Allergen Reactions  . Finasteride     Other reaction(s): Other (See Comments) PAIN IN BREAST     Current Outpatient Prescriptions:  .  acetaminophen (TYLENOL) 500 MG tablet, Take 500  mg by mouth every 8 (eight) hours as needed., Disp: , Rfl:  .  bisoprolol-hydrochlorothiazide (ZIAC) 5-6.25 MG tablet, take 1 tablet by mouth once daily, Disp: 30 tablet, Rfl: 8 .  naproxen sodium (ANAPROX) 220 MG tablet, Take 220 mg by mouth as needed., Disp: , Rfl:  .  simvastatin (ZOCOR) 40 MG tablet, Take 1 tablet by mouth daily., Disp: , Rfl: 0 .  tamsulosin (FLOMAX) 0.4 MG CAPS capsule, Take 1 capsule (0.4 mg total) by mouth daily. (Patient not taking: Reported on 12/10/2016), Disp: 30 capsule, Rfl: 0  Review of Systems  Constitutional: Negative.   HENT: Negative.   Eyes: Negative.   Respiratory: Negative.   Gastrointestinal: Negative.   Endocrine: Negative.   Genitourinary: Negative.   Musculoskeletal: Positive for arthralgias.  Skin: Negative.   Allergic/Immunologic: Negative.   Neurological: Negative.   Hematological: Negative.   Psychiatric/Behavioral: Negative.     Social History  Substance Use Topics  . Smoking status: Former Smoker    Packs/day: 3.00    Years: 25.00    Types: Cigarettes    Quit date: 12/27/1978  . Smokeless tobacco: Never Used  . Alcohol use No   Objective:   BP 138/60 (BP Location: Right Arm, Patient Position: Sitting, Cuff Size: Normal)   Pulse 62   Temp 98.2 F (36.8 C) (Oral)   Resp 16   Wt 198 lb (89.8 kg)   SpO2 95%   BMI 29.67 kg/m  Vitals:   02/25/17 1409  BP: 138/60  Pulse: 62  Resp: 16  Temp: 98.2 F (36.8 C)  TempSrc: Oral  SpO2: 95%  Weight: 198 lb (89.8 kg)     Physical Exam   General Appearance:    Alert, cooperative, no distress  Eyes:    PERRL, conjunctiva/corneas clear, EOM's intact       Lungs:     Clear to auscultation bilaterally, respirations unlabored  Heart:    Regular rate and rhythm  Neurologic:   Awake, alert, oriented x 3. No apparent focal neurological           defect.   Ms:     Mild tenderness left SI joint. Minimal swelling of DIPs of both hands.        Assessment & Plan:     1. Left hip  pain  - Ambulatory referral to Orthopedic Surgery  2. Essential (primary) hypertension Stable, Continue current medications.   - Comprehensive metabolic panel  3. Osteoarthritis of hand, unspecified laterality, unspecified osteoarthritis type  - naproxen (NAPROSYN) 500 MG tablet; Take 1 tablet (500 mg total) by mouth 2 (two) times daily with a meal.  Dispense: 30 tablet; Refill: 0  4. Intermittent claudication (HCC) He did have low ABI on home ABI assessment in September. Will schedule ABI and waveform analysis in office.   5. Coronary artery disease involving native heart without angina pectoris, unspecified vessel or lesion type Asymptomatic. Compliant with medication.  Continue aggressive risk factor modification.  He is tolerating simvastatin well with no adverse effects.   - Lipid panel - Comprehensive metabolic panel       Mila Merryonald Fisher, MD  [facility]

## 2017-02-28 DIAGNOSIS — I10 Essential (primary) hypertension: Secondary | ICD-10-CM | POA: Diagnosis not present

## 2017-02-28 DIAGNOSIS — I2510 Atherosclerotic heart disease of native coronary artery without angina pectoris: Secondary | ICD-10-CM | POA: Diagnosis not present

## 2017-02-28 NOTE — Telephone Encounter (Signed)
Patient was seen on 02/25/17.

## 2017-03-01 ENCOUNTER — Telehealth: Payer: Self-pay

## 2017-03-01 LAB — LIPID PANEL
CHOLESTEROL TOTAL: 117 mg/dL (ref 100–199)
Chol/HDL Ratio: 3.2 ratio units (ref 0.0–5.0)
HDL: 37 mg/dL — AB (ref >39)
LDL CALC: 64 mg/dL (ref 0–99)
Triglycerides: 78 mg/dL (ref 0–149)
VLDL Cholesterol Cal: 16 mg/dL (ref 5–40)

## 2017-03-01 LAB — COMPREHENSIVE METABOLIC PANEL
ALBUMIN: 4.1 g/dL (ref 3.5–4.7)
ALT: 11 IU/L (ref 0–44)
AST: 16 IU/L (ref 0–40)
Albumin/Globulin Ratio: 1.5 (ref 1.2–2.2)
Alkaline Phosphatase: 42 IU/L (ref 39–117)
BILIRUBIN TOTAL: 0.5 mg/dL (ref 0.0–1.2)
BUN / CREAT RATIO: 18 (ref 10–24)
BUN: 20 mg/dL (ref 8–27)
CHLORIDE: 101 mmol/L (ref 96–106)
CO2: 29 mmol/L (ref 18–29)
Calcium: 9.3 mg/dL (ref 8.6–10.2)
Creatinine, Ser: 1.13 mg/dL (ref 0.76–1.27)
GFR calc Af Amer: 68 mL/min/{1.73_m2} (ref >59)
GFR calc non Af Amer: 59 mL/min/{1.73_m2} — ABNORMAL LOW (ref >59)
GLOBULIN, TOTAL: 2.7 g/dL (ref 1.5–4.5)
GLUCOSE: 107 mg/dL — AB (ref 65–99)
Potassium: 4.4 mmol/L (ref 3.5–5.2)
SODIUM: 144 mmol/L (ref 134–144)
Total Protein: 6.8 g/dL (ref 6.0–8.5)

## 2017-03-01 NOTE — Telephone Encounter (Signed)
Patient advised as below. Patient verbalizes understanding and is in agreement with treatment plan.  

## 2017-03-01 NOTE — Telephone Encounter (Signed)
-----   Message from Malva Limesonald E Fisher, MD sent at 03/01/2017  7:50 AM EST ----- Labs are normal. Cholesterol is well controlled.  Continue current medications.  Check yearly.

## 2017-03-01 NOTE — Telephone Encounter (Signed)
Left message to call back  

## 2017-03-04 DIAGNOSIS — M47817 Spondylosis without myelopathy or radiculopathy, lumbosacral region: Secondary | ICD-10-CM | POA: Diagnosis not present

## 2017-03-04 DIAGNOSIS — M545 Low back pain: Secondary | ICD-10-CM | POA: Diagnosis not present

## 2017-03-14 ENCOUNTER — Ambulatory Visit: Payer: Medicare Other | Admitting: Urology

## 2017-03-14 ENCOUNTER — Ambulatory Visit: Payer: Medicare Other | Admitting: Family Medicine

## 2017-03-15 NOTE — Progress Notes (Signed)
9:41 AM   Jose [name] August 13, 1928 [id number]  Referring provider: Malva Limes, MD [id number] [place], [place] [address]  Chief Complaint  Patient presents with  . Nephrolithiasis    HPI: 83 yo WM with renal lesions, bilateral nephrolithiasis and BPH with LU TS who presents for a 6 month follow up.  Renal lesions On RUS completed on 06/07/2016 noted smaller hypoechoic structures bilaterally were not well characterize.  Repeated RUS on 09/07/2016 noted no change in these lesions.   Repeated RUS on 02/03/2017 noted no change in these lesions.    Bilateral nephrolithiasis CT renal stone study completed on 04/15/2016 noted bilateral nephrolithiasis with a right UVJ stone approximately 4 mm in size causing mild hydroureter and pyelocaliectasis.  Patient underwent right ESWL for definitive treatment for the 4 mm UVJ stone on 04/29/2016.  Follow up RUS noted resolution of the hydronephrosis.   He has had no flank pain or gross hematuria.  BPH WITH LUTS His IPSS score today is 17, which is moderate lower urinary tract symptomatology. He is mostly satisfied with his quality life due to his urinary symptoms.  His major complaint today is leakage of urine.  He has had these symptoms for many years.  He denies any dysuria, hematuria or suprapubic pain.  He is not taking his tamsulosin 0.4 mg daily.   He also denies any recent fevers, chills, nausea or vomiting.     IPSS    Row Name 03/16/17 0900         International Prostate Symptom Score   How often have you had the sensation of not emptying your bladder? About half the time     How often have you had to urinate less than every two hours? More than half the time     How often have you found you stopped and started again several times when you urinated? Less than half the time     How often have you found it difficult to postpone urination? More than half the time     How often have you had a weak urinary stream?  Less than half the time     How often have you had to strain to start urination? Less than 1 in 5 times     How many times did you typically get up at night to urinate? 1 Time     Total IPSS Score 17       Quality of Life due to urinary symptoms   If you were to spend the rest of your life with your urinary condition just the way it is now how would you feel about that? Mostly Satisfied        Score:  1-7 Mild 8-19 Moderate 20-35 Severe      PMH: Past Medical History:  Diagnosis Date  . Allergy   . BPH (benign prostatic hyperplasia)   . Chronic kidney disease   . DDD (degenerative disc disease), lumbar   . Depression   . Diverticulosis   . Elevated PSA   . Gross hematuria   . Gynecomastia   . Headache   . History of kidney stones   . HTN (hypertension)   . Incomplete bladder emptying   . Nodular prostate with urinary obstruction   . Organic impotence   . OSA (obstructive sleep apnea)   . Renal colic   . Rosacea   . Sciatica   . Urinary frequency     Surgical  History: Past Surgical History:  Procedure Laterality Date  . ANGIOPLASTY  2001   PTCA and stenting of LAD by Dr. Juliann Pares  . CATARACT EXTRACTION Bilateral 07/2006  . CT Scan of head  12/01/2004   Normal  . DOPPLER ECHOCARDIOGRAPHY  03/19/2013   Normal; Moderate global LV dysfunction. EF=45%. Mild Aortic insufficiency  . Double Ureter stent placement  08/1999   Double J Ureter stent placement  . EXTRACORPOREAL SHOCK WAVE LITHOTRIPSY Right 04/29/2016   Procedure: EXTRACORPOREAL SHOCK WAVE LITHOTRIPSY (ESWL);  Surgeon: Vanna Scotland, MD;  Location: [facility] ORS;  Service: Urology;  Laterality: Right;  . MRI CERVICAL SPINE WO CONTRAST ([facility] HX)  01/28/2010   Abnormal Results; Arhritis and bulging discs. Referral to Neurosurgery  . MRI CERVICAL SPINE WO CONTRAST ([facility] HX)  10/19/2005   Abnormal, Bone spurs  . Sleep study  10/06/2011   Severe sleep apnea. AHI= 35.4 per hr. Done at [facility]  .  SPIROMETRY  09/11/2007   Moderately Severe obstruction  . TRANSURETHRAL RESECTION OF PROSTATE      Home Medications:  Allergies as of 03/16/2017      Reactions   Finasteride    Other reaction(s): Other (See Comments) PAIN IN BREAST      Medication List       Accurate as of 03/16/17  9:41 AM. Always use your most recent med list.          acetaminophen 500 MG tablet Commonly known as:  TYLENOL Take 500 mg by mouth every 8 (eight) hours as needed.   bisoprolol-hydrochlorothiazide 5-6.25 MG tablet Commonly known as:  ZIAC take 1 tablet by mouth once daily   naproxen 500 MG tablet Commonly known as:  NAPROSYN Take 1 tablet (500 mg total) by mouth 2 (two) times daily with a meal.   simvastatin 40 MG tablet Commonly known as:  ZOCOR Take 1 tablet by mouth daily.   tamsulosin 0.4 MG Caps capsule Commonly known as:  FLOMAX Take 1 capsule (0.4 mg total) by mouth daily.       Allergies:  Allergies  Allergen Reactions  . Finasteride     Other reaction(s): Other (See Comments) PAIN IN BREAST    Family History: Family History  Problem Relation Age of Onset  . Heart disease Father   . Diabetes Father     type 2  . Hyperlipidemia Father   . Stroke Father   . Kidney disease Father   . Hypertension Other   . Breast cancer Other   . Lung cancer Other   . Alcohol abuse Paternal Uncle   . Cancer Sister   . Cancer Son   . Prostate cancer Neg Hx     Social History:  reports that he quit smoking about 38 years ago. His smoking use included Cigarettes. He has a 75.00 pack-year smoking history. He has never used smokeless tobacco. He reports that he does not drink alcohol or use drugs.  ROS: UROLOGY Frequent Urination?: No Hard to postpone urination?: No Burning/pain with urination?: No Get up at night to urinate?: No Leakage of urine?: Yes Urine stream starts and stops?: No Trouble starting stream?: No Do you have to strain to urinate?: No Blood in urine?:  No Urinary tract infection?: No Sexually transmitted disease?: No Injury to kidneys or bladder?: No Painful intercourse?: No Weak stream?: No Erection problems?: No Penile pain?: No  Gastrointestinal Nausea?: No Vomiting?: No Indigestion/heartburn?: No Diarrhea?: No Constipation?: No  Constitutional Fever: No Night sweats?: No Weight loss?:  No Fatigue?: No  Skin Skin rash/lesions?: No Itching?: No  Eyes Blurred vision?: No Double vision?: No  Ears/Nose/Throat Sore throat?: No Sinus problems?: Yes  Hematologic/Lymphatic Swollen glands?: No Easy bruising?: No  Cardiovascular Leg swelling?: No Chest pain?: No  Respiratory Cough?: No Shortness of breath?: No  Endocrine Excessive thirst?: No  Musculoskeletal Back pain?: Yes Joint pain?: No  Neurological Headaches?: No Dizziness?: No  Psychologic Depression?: No Anxiety?: No  Physical Exam: BP (!) 162/66 (BP Location: Left Arm, Patient Position: Sitting, Cuff Size: Normal)   Pulse 71   Ht 5' 8.5" (1.74 m)   Wt 194 lb 1.6 oz (88 kg)   BMI 29.08 kg/m   Constitutional: Well nourished. Alert and oriented, No acute distress. HEENT: [place] AT, moist mucus membranes. Trachea midline, no masses. Cardiovascular: No clubbing, cyanosis, or edema. Respiratory: Normal respiratory effort, no increased work of breathing. GI: Abdomen is soft, non tender, non distended, no abdominal masses. Liver and spleen not palpable.  No hernias appreciated.  Stool sample for occult testing is not indicated.   GU: No CVA tenderness.  No bladder fullness or masses.  Patient with uncircumcised phallus. Foreskin easily retracted Urethral meatus is patent.  No penile discharge. No penile lesions or rashes. Scrotum without lesions, cysts, rashes and/or edema.  Testicles are located scrotally bilaterally. No masses are appreciated in the testicles. Left and right epididymis are normal. Rectal: Patient with  normal sphincter tone. Anus  and perineum without scarring or rashes. No rectal masses are appreciated. Prostate is approximately 55 grams, no nodules are appreciated. Seminal vesicles are normal. Skin: No rashes, bruises or suspicious lesions. Lymph: No cervical or inguinal adenopathy. Neurologic: Grossly intact, no focal deficits, moving all 4 extremities. Psychiatric: Normal mood and affect.  Laboratory Data: Lab Results  Component Value Date   WBC 6.9 03/27/2015   HGB 15.4 03/27/2015   HCT 46 03/27/2015   PLT 243 03/27/2015    Lab Results  Component Value Date   CREATININE 1.13 02/28/2017    Lab Results  Component Value Date   HGBA1C 5.9 02/13/2016    Lab Results  Component Value Date   TSH 4.50 03/27/2015       Component Value Date/Time   CHOL 117 02/28/2017 0949   HDL 37 (L) 02/28/2017 0949   CHOLHDL 3.2 02/28/2017 0949   LDLCALC 64 02/28/2017 0949    Lab Results  Component Value Date   AST 16 02/28/2017   Lab Results  Component Value Date   ALT 11 02/28/2017   PSA History  1.9 ng/mL on 09/14/2016  Pertinent Imaging CLINICAL DATA:  Congenital bilateral renal cysts  EXAM: RENAL / URINARY TRACT ULTRASOUND COMPLETE  COMPARISON:  09/07/2016  FINDINGS: Right Kidney:  Length: 11.2 cm. Mildly increased echotexture and cortical thinning. Previously seen irregular hypoechoic area in the upper pole the right kidney not well visualized on today's study. 4.3 cm simple appearing lower pole cyst. Adjacent 10-11 mm cysts in the lower pole. No hydronephrosis.  Left Kidney:  Length: 13.8 cm. Mildly increased echotexture and cortical thinning. No hydronephrosis. 6.6 cm anechoic lower pole cyst. Adjacent 1.9 cm anechoic cyst. 1.6 cm previously.  Bladder:  Appears normal for degree of bladder distention.  IMPRESSION: Mildly increased echotexture in the kidneys bilaterally with cortical thinning. Findings suggest chronic medical renal disease.  Bilateral renal cysts.  Visualized cysts have not significantly changed since prior study.   Electronically Signed   By: Charlett NoseKevin  Dover M.D.   On: 02/02/2017 11:39  Assessment & Plan:   1. Renal lesions   - bilateral hypoechoic structures have not changed on the last two RUS  - no further imaging indicated at this time  2. Bilateral nephrolithiasis  - bilateral nephrolithiasis confirmed with CT renal stone study in 2017  - patient deferred follow up KUB's   - he will wait until he has symptoms of renal colic or gross hematuria  3. BPH with LUTS  - IPSS score is 17/2  - Continue conservative management, avoiding bladder irritants and timed voiding's  - most bothersome symptom is post void dribbling  - restart tamsulosin 0.4 mg daily; refills given  - RTC in 12 months for IPS'S and exam   Return in about 1 year (around 03/16/2018) for IPSS and exam.  These notes generated with voice recognition software. I apologize for typographical errors.  Michiel Cowboy, PA-C  [facility] [address] [place], [place] [address] [phone]

## 2017-03-16 ENCOUNTER — Ambulatory Visit: Payer: Medicare HMO | Admitting: Urology

## 2017-03-16 ENCOUNTER — Encounter: Payer: Self-pay | Admitting: Urology

## 2017-03-16 VITALS — BP 162/66 | HR 71 | Ht 68.5 in | Wt 194.1 lb

## 2017-03-16 DIAGNOSIS — N138 Other obstructive and reflux uropathy: Secondary | ICD-10-CM | POA: Diagnosis not present

## 2017-03-16 DIAGNOSIS — N401 Enlarged prostate with lower urinary tract symptoms: Secondary | ICD-10-CM | POA: Diagnosis not present

## 2017-03-16 DIAGNOSIS — N200 Calculus of kidney: Secondary | ICD-10-CM | POA: Diagnosis not present

## 2017-03-16 DIAGNOSIS — N281 Cyst of kidney, acquired: Secondary | ICD-10-CM | POA: Diagnosis not present

## 2017-03-17 ENCOUNTER — Ambulatory Visit (INDEPENDENT_AMBULATORY_CARE_PROVIDER_SITE_OTHER): Payer: Medicare HMO | Admitting: Family Medicine

## 2017-03-17 ENCOUNTER — Encounter: Payer: Self-pay | Admitting: Family Medicine

## 2017-03-17 VITALS — BP 150/60 | HR 66 | Temp 97.6°F | Resp 16 | Wt 199.0 lb

## 2017-03-17 DIAGNOSIS — N200 Calculus of kidney: Secondary | ICD-10-CM

## 2017-03-17 DIAGNOSIS — M545 Low back pain: Secondary | ICD-10-CM | POA: Diagnosis not present

## 2017-03-17 DIAGNOSIS — M519 Unspecified thoracic, thoracolumbar and lumbosacral intervertebral disc disorder: Secondary | ICD-10-CM

## 2017-03-17 DIAGNOSIS — M5417 Radiculopathy, lumbosacral region: Secondary | ICD-10-CM | POA: Diagnosis not present

## 2017-03-17 NOTE — Progress Notes (Signed)
Patient: Jose [name]. Male    DOB: 1928-08-11   83 y.o.   MRN: [id number] Visit Date: 03/17/2017  Today's Provider: Dortha Kern, PA   Chief Complaint  Patient presents with  . Follow-up   Subjective:    HPI Follow up:  Patient comes in today wanting to follow up after being see by Urology yesterday . Patient was seen for bilateral renal cysts, Kidney stones and BPH. Patient was advised to restart Tamsulosin and to follow up in 1 year. Was see by Dr. Apolinar Junes (urologist) yesterday with renal ultrasound showing renal cysts and renal stones.            Also, having more low back pain on the left for the past 2 months and was seen by orthopedist (Dr. Real Cons) on 03-07-17 and treated with Medrol Dosepak with diagnosis of osteoarthritis of SI joints, facet arthropathy and DDD at L5-S1. Received back brace at home and finds  Past Medical History:  Diagnosis Date  . Allergy   . BPH (benign prostatic hyperplasia)   . Chronic kidney disease   . DDD (degenerative disc disease), lumbar   . Depression   . Diverticulosis   . Elevated PSA   . Gross hematuria   . Gynecomastia   . Headache   . History of kidney stones   . HTN (hypertension)   . Incomplete bladder emptying   . Nodular prostate with urinary obstruction   . Organic impotence   . OSA (obstructive sleep apnea)   . Renal colic   . Rosacea   . Sciatica   . Urinary frequency    Past Surgical History:  Procedure Laterality Date  . ANGIOPLASTY  2001   PTCA and stenting of LAD by Dr. Juliann Pares  . CATARACT EXTRACTION Bilateral 07/2006  . CT Scan of head  12/01/2004   Normal  . DOPPLER ECHOCARDIOGRAPHY  03/19/2013   Normal; Moderate global LV dysfunction. EF=45%. Mild Aortic insufficiency  . Double Ureter stent placement  08/1999   Double J Ureter stent placement  . EXTRACORPOREAL SHOCK WAVE LITHOTRIPSY Right 04/29/2016   Procedure: EXTRACORPOREAL SHOCK WAVE LITHOTRIPSY (ESWL);  Surgeon: Vanna Scotland, MD;   Location: [facility] ORS;  Service: Urology;  Laterality: Right;  . MRI CERVICAL SPINE WO CONTRAST ([facility] HX)  01/28/2010   Abnormal Results; Arhritis and bulging discs. Referral to Neurosurgery  . MRI CERVICAL SPINE WO CONTRAST ([facility] HX)  10/19/2005   Abnormal, Bone spurs  . Sleep study  10/06/2011   Severe sleep apnea. AHI= 35.4 per hr. Done at [facility]  . SPIROMETRY  09/11/2007   Moderately Severe obstruction  . TRANSURETHRAL RESECTION OF PROSTATE     Family History  Problem Relation Age of Onset  . Heart disease Father   . Diabetes Father     type 2  . Hyperlipidemia Father   . Stroke Father   . Kidney disease Father   . Hypertension Other   . Breast cancer Other   . Lung cancer Other   . Alcohol abuse Paternal Uncle   . Cancer Sister   . Cancer Son   . Prostate cancer Neg Hx     Allergies  Allergen Reactions  . Finasteride     Other reaction(s): Other (See Comments) PAIN IN BREAST     Current Outpatient Prescriptions:  .  acetaminophen (TYLENOL) 500 MG tablet, Take 500 mg by mouth every 8 (eight) hours as needed., Disp: , Rfl:  .  bisoprolol-hydrochlorothiazide (  ZIAC) 5-6.25 MG tablet, take 1 tablet by mouth once daily, Disp: 30 tablet, Rfl: 8 .  naproxen (NAPROSYN) 500 MG tablet, Take 1 tablet (500 mg total) by mouth 2 (two) times daily with a meal., Disp: 30 tablet, Rfl: 0 .  simvastatin (ZOCOR) 40 MG tablet, Take 1 tablet by mouth daily., Disp: , Rfl: 0 .  tamsulosin (FLOMAX) 0.4 MG CAPS capsule, Take 1 capsule (0.4 mg total) by mouth daily. (Patient not taking: Reported on 03/17/2017), Disp: 30 capsule, Rfl: 0  Review of Systems  Constitutional: Negative for appetite change, chills and fever.  Respiratory: Negative for chest tightness, shortness of breath and wheezing.   Cardiovascular: Negative for chest pain and palpitations.  Gastrointestinal: Negative for abdominal pain, nausea and vomiting.  Genitourinary: Positive for frequency.    Social History    Substance Use Topics  . Smoking status: Former Smoker    Packs/day: 3.00    Years: 25.00    Types: Cigarettes    Quit date: 12/27/1978  . Smokeless tobacco: Never Used  . Alcohol use No   Objective:   BP (!) 150/60 (BP Location: Right Arm, Patient Position: Sitting, Cuff Size: Large)   Pulse 66   Temp 97.6 F (36.4 C) (Oral)   Resp 16   Wt 199 lb (90.3 kg)   SpO2 96% Comment: room air  BMI 29.82 kg/m  There were no vitals filed for this visit.   Physical Exam  Constitutional: He is oriented to person, place, and time. He appears well-developed and well-nourished. No distress.  HENT:  Head: Normocephalic and atraumatic.  Right Ear: Hearing normal.  Left Ear: Hearing normal.  Nose: Nose normal.  Eyes: Conjunctivae and lids are normal. Right eye exhibits no discharge. Left eye exhibits no discharge. No scleral icterus.  Cardiovascular: Normal rate and regular rhythm.   Pulmonary/Chest: Effort normal and breath sounds normal. No respiratory distress.  Abdominal: Soft. Bowel sounds are normal.  Musculoskeletal:  Stiffness of spine with some tenderness in the right SI region. SLR's 80 degrees without pain. Pain occurs when the twists his spine.   Neurological: He is alert and oriented to person, place, and time.  Skin: Skin is intact. No lesion and no rash noted.  Psychiatric: He has a normal mood and affect. His speech is normal and behavior is normal. Thought content normal.      Assessment & Plan:     1. Lumbar disc disease Having right low back pain flare over the past 2 months. Saw Dr. Real ConsKrazinski (orthopedist) on 03-07-17 and treated with Medrol Dosepak. Has a history of osteoarthritis of SI joints with DDD at L5-S1 and facet arthropathy on past x-rays and MRI. Also, has bilateral hip arthritis on films of 12-10-16. States the back discomfort improved with the steroid dosepak and using a cane for walking. Received a back brace in the mail at home and unsure about its use. Will  see Dr. Real ConsKrazinski at 3:15 pm today for follow up and back brace use instructions.  2. Kidney stones Evaluated yesterday by Dr. Apolinar JunesBrandon (urologist) for renal stones and bilateral renal cysts with BPH. Was restarted on the Tamsulosin 0.4 mg qd and will follow up with them in a year. No hematuria or changes in urinary frequency.       Dortha Kernennis Zaryia Markel, PA  [facility]

## 2017-04-01 DIAGNOSIS — H34832 Tributary (branch) retinal vein occlusion, left eye, with macular edema: Secondary | ICD-10-CM | POA: Diagnosis not present

## 2017-04-13 DIAGNOSIS — I10 Essential (primary) hypertension: Secondary | ICD-10-CM | POA: Diagnosis not present

## 2017-04-13 DIAGNOSIS — M1990 Unspecified osteoarthritis, unspecified site: Secondary | ICD-10-CM | POA: Diagnosis not present

## 2017-04-13 DIAGNOSIS — E669 Obesity, unspecified: Secondary | ICD-10-CM | POA: Diagnosis not present

## 2017-04-13 DIAGNOSIS — N182 Chronic kidney disease, stage 2 (mild): Secondary | ICD-10-CM | POA: Diagnosis not present

## 2017-04-13 DIAGNOSIS — I209 Angina pectoris, unspecified: Secondary | ICD-10-CM | POA: Diagnosis not present

## 2017-04-13 DIAGNOSIS — N23 Unspecified renal colic: Secondary | ICD-10-CM | POA: Diagnosis not present

## 2017-04-13 DIAGNOSIS — E784 Other hyperlipidemia: Secondary | ICD-10-CM | POA: Diagnosis not present

## 2017-04-13 DIAGNOSIS — I2510 Atherosclerotic heart disease of native coronary artery without angina pectoris: Secondary | ICD-10-CM | POA: Diagnosis not present

## 2017-04-19 DIAGNOSIS — M5136 Other intervertebral disc degeneration, lumbar region: Secondary | ICD-10-CM | POA: Diagnosis not present

## 2017-04-19 DIAGNOSIS — M545 Low back pain: Secondary | ICD-10-CM | POA: Diagnosis not present

## 2017-04-27 DIAGNOSIS — M5416 Radiculopathy, lumbar region: Secondary | ICD-10-CM | POA: Diagnosis not present

## 2017-04-27 DIAGNOSIS — M545 Low back pain: Secondary | ICD-10-CM | POA: Diagnosis not present

## 2017-04-30 ENCOUNTER — Emergency Department: Payer: Medicare HMO

## 2017-04-30 ENCOUNTER — Inpatient Hospital Stay
Admission: EM | Admit: 2017-04-30 | Discharge: 2017-05-03 | DRG: 872 | Disposition: A | Discharge status: Home / Self Care | Payer: Medicare HMO | Attending: Internal Medicine | Admitting: Internal Medicine

## 2017-04-30 ENCOUNTER — Encounter: Payer: Self-pay | Admitting: Emergency Medicine

## 2017-04-30 DIAGNOSIS — Z87891 Personal history of nicotine dependence: Secondary | ICD-10-CM | POA: Diagnosis not present

## 2017-04-30 DIAGNOSIS — Z87442 Personal history of urinary calculi: Secondary | ICD-10-CM | POA: Diagnosis not present

## 2017-04-30 DIAGNOSIS — Z888 Allergy status to other drugs, medicaments and biological substances status: Secondary | ICD-10-CM | POA: Diagnosis not present

## 2017-04-30 DIAGNOSIS — Z79899 Other long term (current) drug therapy: Secondary | ICD-10-CM | POA: Diagnosis not present

## 2017-04-30 DIAGNOSIS — K5900 Constipation, unspecified: Secondary | ICD-10-CM | POA: Diagnosis present

## 2017-04-30 DIAGNOSIS — S50311A Abrasion of right elbow, initial encounter: Secondary | ICD-10-CM | POA: Diagnosis present

## 2017-04-30 DIAGNOSIS — R69 Illness, unspecified: Secondary | ICD-10-CM | POA: Diagnosis not present

## 2017-04-30 DIAGNOSIS — R1111 Vomiting without nausea: Secondary | ICD-10-CM | POA: Diagnosis not present

## 2017-04-30 DIAGNOSIS — K5730 Diverticulosis of large intestine without perforation or abscess without bleeding: Secondary | ICD-10-CM | POA: Diagnosis not present

## 2017-04-30 DIAGNOSIS — Z841 Family history of disorders of kidney and ureter: Secondary | ICD-10-CM | POA: Diagnosis not present

## 2017-04-30 DIAGNOSIS — I129 Hypertensive chronic kidney disease with stage 1 through stage 4 chronic kidney disease, or unspecified chronic kidney disease: Secondary | ICD-10-CM | POA: Diagnosis not present

## 2017-04-30 DIAGNOSIS — Z823 Family history of stroke: Secondary | ICD-10-CM | POA: Diagnosis not present

## 2017-04-30 DIAGNOSIS — A419 Sepsis, unspecified organism: Secondary | ICD-10-CM | POA: Diagnosis present

## 2017-04-30 DIAGNOSIS — Z87440 Personal history of urinary (tract) infections: Secondary | ICD-10-CM

## 2017-04-30 DIAGNOSIS — B9620 Unspecified Escherichia coli [E. coli] as the cause of diseases classified elsewhere: Secondary | ICD-10-CM | POA: Diagnosis not present

## 2017-04-30 DIAGNOSIS — E860 Dehydration: Secondary | ICD-10-CM | POA: Diagnosis not present

## 2017-04-30 DIAGNOSIS — D72829 Elevated white blood cell count, unspecified: Secondary | ICD-10-CM | POA: Diagnosis not present

## 2017-04-30 DIAGNOSIS — R053 Chronic cough: Secondary | ICD-10-CM

## 2017-04-30 DIAGNOSIS — Z8249 Family history of ischemic heart disease and other diseases of the circulatory system: Secondary | ICD-10-CM | POA: Diagnosis not present

## 2017-04-30 DIAGNOSIS — Z833 Family history of diabetes mellitus: Secondary | ICD-10-CM | POA: Diagnosis not present

## 2017-04-30 DIAGNOSIS — W19XXXA Unspecified fall, initial encounter: Secondary | ICD-10-CM | POA: Diagnosis not present

## 2017-04-30 DIAGNOSIS — R410 Disorientation, unspecified: Secondary | ICD-10-CM | POA: Diagnosis not present

## 2017-04-30 DIAGNOSIS — N400 Enlarged prostate without lower urinary tract symptoms: Secondary | ICD-10-CM | POA: Diagnosis not present

## 2017-04-30 DIAGNOSIS — E785 Hyperlipidemia, unspecified: Secondary | ICD-10-CM | POA: Diagnosis present

## 2017-04-30 DIAGNOSIS — N183 Chronic kidney disease, stage 3 (moderate): Secondary | ICD-10-CM | POA: Diagnosis not present

## 2017-04-30 DIAGNOSIS — R062 Wheezing: Secondary | ICD-10-CM

## 2017-04-30 DIAGNOSIS — E876 Hypokalemia: Secondary | ICD-10-CM | POA: Diagnosis not present

## 2017-04-30 DIAGNOSIS — N390 Urinary tract infection, site not specified: Secondary | ICD-10-CM | POA: Diagnosis present

## 2017-04-30 DIAGNOSIS — F329 Major depressive disorder, single episode, unspecified: Secondary | ICD-10-CM | POA: Diagnosis present

## 2017-04-30 DIAGNOSIS — R05 Cough: Secondary | ICD-10-CM | POA: Diagnosis not present

## 2017-04-30 DIAGNOSIS — S299XXA Unspecified injury of thorax, initial encounter: Secondary | ICD-10-CM | POA: Diagnosis not present

## 2017-04-30 DIAGNOSIS — R55 Syncope and collapse: Secondary | ICD-10-CM | POA: Diagnosis not present

## 2017-04-30 DIAGNOSIS — S0990XA Unspecified injury of head, initial encounter: Secondary | ICD-10-CM | POA: Diagnosis not present

## 2017-04-30 LAB — HEPATIC FUNCTION PANEL
ALBUMIN: 4.1 g/dL (ref 3.5–5.0)
ALK PHOS: 38 U/L (ref 38–126)
ALT: 13 U/L — ABNORMAL LOW (ref 17–63)
AST: 21 U/L (ref 15–41)
Bilirubin, Direct: 0.2 mg/dL (ref 0.1–0.5)
Indirect Bilirubin: 0.7 mg/dL (ref 0.3–0.9)
TOTAL PROTEIN: 7.3 g/dL (ref 6.5–8.1)
Total Bilirubin: 0.9 mg/dL (ref 0.3–1.2)

## 2017-04-30 LAB — URINALYSIS, ROUTINE W REFLEX MICROSCOPIC
BILIRUBIN URINE: NEGATIVE
GLUCOSE, UA: 50 mg/dL — AB
KETONES UR: NEGATIVE mg/dL
Nitrite: POSITIVE — AB
PH: 5 (ref 5.0–8.0)
Protein, ur: 100 mg/dL — AB
Specific Gravity, Urine: 1.019 (ref 1.005–1.030)

## 2017-04-30 LAB — DIFFERENTIAL
BASOS ABS: 0 10*3/uL (ref 0–0.1)
Basophils Relative: 0 %
EOS ABS: 0 10*3/uL (ref 0–0.7)
EOS PCT: 0 %
LYMPHS ABS: 1.1 10*3/uL (ref 1.0–3.6)
Lymphocytes Relative: 5 %
Monocytes Absolute: 1.9 10*3/uL — ABNORMAL HIGH (ref 0.2–1.0)
Monocytes Relative: 10 %
NEUTROS PCT: 85 %
Neutro Abs: 16.9 10*3/uL — ABNORMAL HIGH (ref 1.4–6.5)

## 2017-04-30 LAB — BASIC METABOLIC PANEL
Anion gap: 9 (ref 5–15)
BUN: 19 mg/dL (ref 6–20)
CO2: 27 mmol/L (ref 22–32)
CREATININE: 1.1 mg/dL (ref 0.61–1.24)
Calcium: 9 mg/dL (ref 8.9–10.3)
Chloride: 106 mmol/L (ref 101–111)
GFR calc Af Amer: >60 mL/min (ref >60)
GFR calc non Af Amer: 58 mL/min — ABNORMAL LOW (ref >60)
Glucose, Bld: 143 mg/dL — ABNORMAL HIGH (ref 65–99)
POTASSIUM: 3.7 mmol/L (ref 3.5–5.1)
SODIUM: 142 mmol/L (ref 135–145)

## 2017-04-30 LAB — CBC
HCT: 46.5 % (ref 40.0–52.0)
Hemoglobin: 14.9 g/dL (ref 13.0–18.0)
MCH: 28.7 pg (ref 26.0–34.0)
MCHC: 32.1 g/dL (ref 32.0–36.0)
MCV: 89.4 fL (ref 80.0–100.0)
PLATELETS: 210 10*3/uL (ref 150–440)
RBC: 5.2 MIL/uL (ref 4.40–5.90)
RDW: 13.6 % (ref 11.5–14.5)
WBC: 20 10*3/uL — AB (ref 3.8–10.6)

## 2017-04-30 LAB — TROPONIN I

## 2017-04-30 LAB — MAGNESIUM: MAGNESIUM: 1.7 mg/dL (ref 1.7–2.4)

## 2017-04-30 LAB — LACTIC ACID, PLASMA: Lactic Acid, Venous: 1.3 mmol/L (ref 0.5–1.9)

## 2017-04-30 LAB — LIPASE, BLOOD: LIPASE: 26 U/L (ref 11–51)

## 2017-04-30 MED ORDER — LABETALOL HCL 5 MG/ML IV SOLN
10.0000 mg | INTRAVENOUS | Status: DC | PRN
  Administered 2017-04-30: 10 mg via INTRAVENOUS
  Filled 2017-04-30 (×3): qty 4

## 2017-04-30 MED ORDER — ENOXAPARIN SODIUM 40 MG/0.4ML ~~LOC~~ SOLN
40.0000 mg | SUBCUTANEOUS | Status: DC
  Administered 2017-04-30 – 2017-05-02 (×3): 40 mg via SUBCUTANEOUS
  Filled 2017-04-30 (×3): qty 0.4

## 2017-04-30 MED ORDER — ONDANSETRON HCL 4 MG PO TABS: 4.0000 mg | ORAL_TABLET | Freq: Four times a day (QID) | ORAL | Status: DC | PRN

## 2017-04-30 MED ORDER — SODIUM CHLORIDE 0.9 % IV SOLN
INTRAVENOUS | Status: DC
  Administered 2017-04-30 – 2017-05-01 (×2): via INTRAVENOUS

## 2017-04-30 MED ORDER — ACETAMINOPHEN 325 MG PO TABS
650.0000 mg | ORAL_TABLET | Freq: Four times a day (QID) | ORAL | Status: DC | PRN
  Administered 2017-05-01 – 2017-05-03 (×2): 650 mg via ORAL
  Filled 2017-04-30: qty 2

## 2017-04-30 MED ORDER — CEFTRIAXONE SODIUM IN DEXTROSE 20 MG/ML IV SOLN
1.0000 g | INTRAVENOUS | Status: DC
  Filled 2017-04-30: qty 50

## 2017-04-30 MED ORDER — ACETAMINOPHEN 650 MG RE SUPP: 650.0000 mg | Freq: Four times a day (QID) | RECTAL | Status: DC | PRN

## 2017-04-30 MED ORDER — SODIUM CHLORIDE 0.9 % IV BOLUS (SEPSIS)
500.0000 mL | INTRAVENOUS | Status: AC
  Administered 2017-04-30: 500 mL via INTRAVENOUS

## 2017-04-30 MED ORDER — ONDANSETRON HCL 4 MG/2ML IJ SOLN: 4.0000 mg | Freq: Four times a day (QID) | INTRAMUSCULAR | Status: DC | PRN

## 2017-04-30 MED ORDER — ONDANSETRON HCL 4 MG/2ML IJ SOLN
4.0000 mg | INTRAMUSCULAR | Status: AC
  Administered 2017-04-30: 4 mg via INTRAVENOUS
  Filled 2017-04-30: qty 2

## 2017-04-30 MED ORDER — CEFTRIAXONE SODIUM IN DEXTROSE 20 MG/ML IV SOLN
1.0000 g | INTRAVENOUS | Status: AC
  Administered 2017-04-30: 1 g via INTRAVENOUS
  Filled 2017-04-30: qty 50

## 2017-04-30 MED ORDER — SIMVASTATIN 10 MG PO TABS
40.0000 mg | ORAL_TABLET | Freq: Every day | ORAL | Status: DC
  Administered 2017-04-30 – 2017-05-03 (×4): 40 mg via ORAL
  Filled 2017-04-30 (×5): qty 4

## 2017-04-30 MED ORDER — BISOPROLOL-HYDROCHLOROTHIAZIDE 5-6.25 MG PO TABS
1.0000 | ORAL_TABLET | Freq: Every day | ORAL | Status: DC
  Administered 2017-04-30 – 2017-05-03 (×4): 1 via ORAL
  Filled 2017-04-30 (×4): qty 1

## 2017-04-30 NOTE — ED Provider Notes (Signed)
[facility] Emergency Department Provider Note  ____________________________________________   First MD Initiated Contact with Patient 04/30/17 1536     (approximate)  I have reviewed the triage vital signs and the nursing notes.   HISTORY  Chief Complaint Fall and Emesis    HPI Jose HuhWilliam E Lynk Jr. is a 83 y.o. male with extensive chronic medical history commensurate with his age who presents by private vehicle for evaluation of persistent vomiting after a fall earlier today.  He went to visit his daughter at [facility] who is recovering from cardiac surgery.  He reportedly passed out acutely and struck the right side of his face.  He does not have any noticeable injury on his face and has no pain, but he has been vomiting multiple times since the fall.  The patient reports that he has not felt well for one to 2 days in general but he is not able to articulate any specific symptoms.  He does report that he has had some nausea since yesterday and has had a cough but he is not sure for how long the cough has been present.  He denies fever/chills, chest pain, shortness of breath, abdominal pain.  He states that he has had some burning during urination.  Overall his symptoms are severe because of the nausea and nothing in particular makes them better or worse.  He currently denies any headache, neck pain or pain in any of his extremities although he does have an abrasion on his right elbow.  He states that he had a tetanus vaccination within the last 5 years.   Past Medical History:  Diagnosis Date  . Allergy   . BPH (benign prostatic hyperplasia)   . Chronic kidney disease   . DDD (degenerative disc disease), lumbar   . Depression   . Diverticulosis   . Elevated PSA   . Gross hematuria   . Gynecomastia   . Headache   . History of kidney stones   . HTN (hypertension)   . Incomplete bladder emptying   . Nodular prostate with urinary obstruction   .  Organic impotence   . OSA (obstructive sleep apnea)   . Renal colic   . Rosacea   . Sciatica   . Urinary frequency     Patient Active Problem List   Diagnosis Date Noted  . Sepsis (HCC) 04/30/2017  . Hydronephrosis with urinary obstruction due to ureteral calculus 05/16/2016  . Kidney stones 04/14/2016  . Flank pain 04/14/2016  . BPH with obstruction/lower urinary tract symptoms 04/14/2016  . Erectile dysfunction of organic origin 04/14/2016  . Arthritis, degenerative 02/20/2016  . LV dysfunction 02/20/2016  . Allergic rhinitis 02/13/2016  . COPD (chronic obstructive pulmonary disease) (HCC) 02/13/2016  . Nephrolithiasis 02/13/2016  . GERD (gastroesophageal reflux disease) 02/13/2016  . Diverticulosis of colon 02/13/2016  . Unspecified visual disturbance 02/13/2016  . Constipation 02/13/2016  . Chronic kidney disease (CKD), stage III (moderate) 02/13/2016  . Memory loss 02/13/2016  . Chronic headache disorder 02/13/2016  . Pre-diabetes 02/13/2016  . BPH (benign prostatic hyperplasia) 02/13/2016  . Urinary incontinence without sensory awareness 11/22/2012  . Nodular prostate 11/22/2012  . Incomplete bladder emptying 11/22/2012  . Elevated prostate specific antigen (PSA) 11/22/2012  . Calculus of kidney 11/22/2012  . Obstructive sleep apnea 10/06/2011  . Lumbar disc disease 01/28/2010  . Spondylosis 01/28/2010  . HLD (hyperlipidemia) 05/25/2006  . Essential (primary) hypertension 05/25/2006  . CAD (coronary artery disease) 05/21/2000  Past Surgical History:  Procedure Laterality Date  . ANGIOPLASTY  2001   PTCA and stenting of LAD by Dr. Juliann Pares  . CATARACT EXTRACTION Bilateral 07/2006  . CT Scan of head  12/01/2004   Normal  . DOPPLER ECHOCARDIOGRAPHY  03/19/2013   Normal; Moderate global LV dysfunction. EF=45%. Mild Aortic insufficiency  . Double Ureter stent placement  08/1999   Double J Ureter stent placement  . EXTRACORPOREAL SHOCK WAVE LITHOTRIPSY Right  04/29/2016   Procedure: EXTRACORPOREAL SHOCK WAVE LITHOTRIPSY (ESWL);  Surgeon: Vanna Scotland, MD;  Location: [facility] ORS;  Service: Urology;  Laterality: Right;  . MRI CERVICAL SPINE WO CONTRAST ([facility] HX)  01/28/2010   Abnormal Results; Arhritis and bulging discs. Referral to Neurosurgery  . MRI CERVICAL SPINE WO CONTRAST ([facility] HX)  10/19/2005   Abnormal, Bone spurs  . Sleep study  10/06/2011   Severe sleep apnea. AHI= 35.4 per hr. Done at [facility]  . SPIROMETRY  09/11/2007   Moderately Severe obstruction  . TRANSURETHRAL RESECTION OF PROSTATE      Prior to Admission medications   Medication Sig Start Date End Date Taking? Authorizing Provider  acetaminophen (TYLENOL) 500 MG tablet Take 500 mg by mouth every 8 (eight) hours as needed.   Yes [provider]  bisoprolol-hydrochlorothiazide [facility]) 5-6.25 MG tablet take 1 tablet by mouth once daily 09/17/16  Yes Fisher, Demetrios Isaacs, MD  simvastatin (ZOCOR) 40 MG tablet Take 1 tablet by mouth daily. 01/21/16  Yes [provider]  naproxen (NAPROSYN) 500 MG tablet Take 1 tablet (500 mg total) by mouth 2 (two) times daily with a meal. Patient not taking: Reported on 04/30/2017 02/25/17   Malva Limes, MD  tamsulosin (FLOMAX) 0.4 MG CAPS capsule Take 1 capsule (0.4 mg total) by mouth daily. Patient not taking: Reported on 03/17/2017 04/29/16   Vanna Scotland, MD    Allergies Finasteride  Family History  Problem Relation Age of Onset  . Heart disease Father   . Diabetes Father     type 2  . Hyperlipidemia Father   . Stroke Father   . Kidney disease Father   . Hypertension Other   . Breast cancer Other   . Lung cancer Other   . Alcohol abuse Paternal Uncle   . Cancer Sister   . Cancer Son   . Prostate cancer Neg Hx     Social History Social History  Substance Use Topics  . Smoking status: Former Smoker    Packs/day: 3.00    Years: 25.00    Types: Cigarettes    Quit date: 12/27/1978  . Smokeless tobacco: Never  Used  . Alcohol use No    Review of Systems Constitutional: No fever/chills.  General malaise for 1-2 days. Eyes: No visual changes. ENT: No sore throat. Cardiovascular: Denies chest pain. Respiratory: Denies shortness of breath.  +Cough Gastrointestinal: No abdominal pain.  Persistent vomiting for several hours.  No diarrhea.  No constipation. Genitourinary: +dysuria. Musculoskeletal: Negative for back pain. Integumentary: Negative for rash. Neurological: Negative for headaches, focal weakness or numbness.   ____________________________________________   PHYSICAL EXAM:  VITAL SIGNS: ED Triage Vitals [04/30/17 1511]  Enc Vitals Group     BP (!) 185/58     Pulse      Resp 20     Temp 99.3 F (37.4 C)     Temp Source Oral     SpO2 93 %     Weight      Height  Head Circumference      Peak Flow      Pain Score      Pain Loc      Pain Edu?      Excl. in GC?     Constitutional: No acute distress but appears ill though non-toxic.  Alert but somnolent, fell asleep during my interview but awakened quickly to touch and loud voice.  Pale. Eyes: Conjunctivae are normal. PERRL. EOMI. Head: Atraumatic.  No tenderness or contusion to right cheek which is the location of impact of his prior fall Nose: No congestion/rhinnorhea. Mouth/Throat: Mucous membranes are dry. Neck: No stridor.  No meningeal signs.  No cervical spine tenderness to palpation. Cardiovascular: Normal rate, regular rhythm. Good peripheral circulation. Grossly normal heart sounds. Respiratory: Normal respiratory effort.  No retractions. Lungs CTAB. Gastrointestinal: Soft with diffuse mild tenderness to palpation throughout, no distention Musculoskeletal: No lower extremity tenderness nor edema. No gross deformities of extremities. Neurologic:  Normal speech and language. No gross focal neurologic deficits are appreciated.  Skin:  Skin is pale, warm, diaphoretic. No rash noted.  Small abrasion to right  elbow.  ____________________________________________   LABS (all labs ordered are listed, but only abnormal results are displayed)  Labs Reviewed  BASIC METABOLIC PANEL - Abnormal; Notable for the following:       Result Value   Glucose, Bld 143 (*)    GFR calc non Af Amer 58 (*)    All other components within normal limits  CBC - Abnormal; Notable for the following:    WBC 20.0 (*)    All other components within normal limits  HEPATIC FUNCTION PANEL - Abnormal; Notable for the following:    ALT 13 (*)    All other components within normal limits  DIFFERENTIAL - Abnormal; Notable for the following:    Neutro Abs 16.9 (*)    Monocytes Absolute 1.9 (*)    All other components within normal limits  URINALYSIS, ROUTINE W REFLEX MICROSCOPIC - Abnormal; Notable for the following:    Color, Urine YELLOW (*)    APPearance HAZY (*)    Glucose, UA 50 (*)    Hgb urine dipstick LARGE (*)    Protein, ur 100 (*)    Nitrite POSITIVE (*)    Leukocytes, UA TRACE (*)    Bacteria, UA RARE (*)    Squamous Epithelial / LPF 0-5 (*)    All other components within normal limits  URINE CULTURE  LIPASE, BLOOD  MAGNESIUM  TROPONIN I  LACTIC ACID, PLASMA  LACTIC ACID, PLASMA   ____________________________________________  EKG  ED ECG REPORT I, Neftaly Swiss, the attending physician, personally viewed and interpreted this ECG.  Date: 04/30/2017 EKG Time: 15:06 Rate: 84 Rhythm: normal sinus rhythm QRS Axis: normal Intervals: normal, LVH with non-specific repolarization abnormalities ST/T Wave abnormalities: 1-2 mm of ST depression in leads V4, V5, and V6.  No reciprocal changes in the septal leads. Conduction Disturbances: none Narrative Interpretation: Non-specific ST segment / T-wave changes, questionable acute ischemia.  A prior EKG from about a year ago shows somewhat similar to a 5 and V6 leads, but the ST segment and T-wave changes are significantly more pronounced  today.  ____________________________________________  RADIOLOGY   Dg Chest 2 View  Result Date: 04/30/2017 CLINICAL DATA:  Status post fall, with syncope. Concern for chest injury. Dry heaves and vomiting. Initial encounter. EXAM: CHEST  2 VIEW COMPARISON:  Chest radiograph performed 06/09/2012 FINDINGS: The lungs are well-aerated. Minimal left basilar scarring  is noted. There is no evidence of focal opacification, pleural effusion or pneumothorax. The heart is normal in size; the mediastinal contour is within normal limits. No acute osseous abnormalities are seen. IMPRESSION: No acute cardiopulmonary process seen. No displaced rib fractures identified. Electronically Signed   By: Roanna Raider M.D.   On: 04/30/2017 17:11   Ct Head Wo Contrast  Result Date: 04/30/2017 CLINICAL DATA:  83 year old male with fall and head injury today. Initial encounter. EXAM: CT HEAD WITHOUT CONTRAST TECHNIQUE: Contiguous axial images were obtained from the base of the skull through the vertex without intravenous contrast. COMPARISON:  07/08/2010 head CT FINDINGS: Brain: No evidence of acute infarction, hemorrhage, hydrocephalus, extra-axial collection or mass lesion/mass effect. Chronic small-vessel white matter ischemic changes again noted. Vascular: Intracranial atherosclerotic calcifications again noted. Skull: Normal. Negative for fracture or focal lesion. Sinuses/Orbits: No acute finding. Other: None. IMPRESSION: No evidence of acute intracranial abnormality. Chronic small-vessel white matter ischemic changes. Electronically Signed   By: Harmon Pier M.D.   On: 04/30/2017 17:03   Ct Renal Stone Study  Result Date: 04/30/2017 CLINICAL DATA:  Status post fall, with dry heaves and vomiting. Syncope. Generalized weakness. Initial encounter. EXAM: CT ABDOMEN AND PELVIS WITHOUT CONTRAST TECHNIQUE: Multidetector CT imaging of the abdomen and pelvis was performed following the standard protocol without IV contrast.  COMPARISON:  CT of the abdomen and pelvis from 04/15/2016 FINDINGS: Lower chest: Diffuse coronary artery calcifications are seen. Calcification is noted at the mitral valve. Minimal bibasilar atelectasis is noted. Hepatobiliary: The liver is unremarkable in appearance. The gallbladder is unremarkable in appearance. The common bile duct remains normal in caliber. Pancreas: The pancreas is within normal limits. Spleen: The spleen is unremarkable in appearance. Adrenals/Urinary Tract: The adrenal glands are grossly unremarkable in appearance. Scattered bilateral renal cysts are seen, measuring up to 5.8 cm in size. Nonspecific perinephric stranding is noted bilaterally. Scattered nonobstructing bilateral renal stones are identified, measuring up to 4 mm in size. No obstructing ureteral stones are identified. There is no evidence of hydronephrosis. Stomach/Bowel: The stomach is unremarkable in appearance. The small bowel is within normal limits. The appendix is normal in caliber, without evidence of appendicitis. Scattered diverticulosis is noted along the entirety of the colon, most prominent along the proximal to mid sigmoid colon, without evidence of diverticulitis. Vascular/Lymphatic: Diffuse calcification is seen along the abdominal aorta and its branches. No retroperitoneal or pelvic sidewall lymphadenopathy is seen. Reproductive: The bladder is mildly distended and grossly unremarkable. The prostate is enlarged, measuring 5.9 cm in transverse dimension, with scattered calcification. Other: No additional soft tissue abnormalities are seen. Musculoskeletal: No acute osseous abnormalities are identified. Facet disease is noted along the lumbar spine. Anterior and lateral bridging osteophytes are noted along the lower thoracic and lumbar spine. The visualized musculature is unremarkable in appearance. IMPRESSION: 1. No evidence of traumatic injury to the abdomen or pelvis. 2. Scattered nonobstructing bilateral renal  stones measure up to 4 mm in size. 3. Diffuse aortic atherosclerosis. 4. Scattered bilateral renal cysts. 5. Diverticulosis along the entirety of the colon, most prominent along the proximal to mid sigmoid colon, without evidence of diverticulitis. 6. Diffuse coronary artery calcifications seen. Calcification at the mitral valve. 7. Enlarged prostate noted. 8. Mild degenerative change along the lower thoracic and lumbar spine. Electronically Signed   By: Roanna Raider M.D.   On: 04/30/2017 18:23    ____________________________________________   PROCEDURES  Critical Care performed: No   Procedure(s) performed:   Procedures  ____________________________________________   INITIAL IMPRESSION / ASSESSMENT AND PLAN / ED COURSE  Pertinent labs & imaging results that were available during my care of the patient were reviewed by me and considered in my medical decision making (see chart for details).  The patient is not well appearing at this time although he does not appear toxic.  He has no significant traumatic injury.  I am concerned about his vomiting, leukocytosis, apparent T-wave changes on the EKG, syncopal episodes, etc.  I will further evaluate broadly and provide [place] a dose of Zofran for his persistent vomiting and a small fluid bolus because he does look dry at this time.   Clinical Course as of Apr 30 1948  Sat Apr 30, 2017  1725 WBC 20 w/ left shift WBC: (!) 20.0 [CF]  1754 Given the patient's vague complaints of abdominal pain and the fact he has a history of kidney stones that required lithotripsy according to his family, I will evaluate with a CT renal stone protocol to make sure he does not have an infected stone.  I discussed the plan with the patient's family but given that he is ill appearing, had syncope and collapse, complicated UTI, and a leukocytosis of 20, and some increased confusion/delirium, I feel he would be best served by coming into the hospital for inpatient  treatment at least until he begins to show clinical improvement.  The family strongly agrees with this and says he is quite far from his baseline.  [CF]  1948 Lactic acid 1.3.  Discussed previously with Dr. Quentin Cornwall who will admit.  [CF]    Clinical Course User Index [CF] Loleta Rose, MD    ____________________________________________  FINAL CLINICAL IMPRESSION(S) / ED DIAGNOSES  Final diagnoses:  Complicated UTI (urinary tract infection)  Delirium  Syncope and collapse     MEDICATIONS GIVEN DURING THIS VISIT:  Medications  bisoprolol-hydrochlorothiazide (ZIAC) 5-6.25 MG per tablet 1 tablet (not administered)  ondansetron (ZOFRAN) injection 4 mg (4 mg Intravenous Given 04/30/17 1612)  sodium chloride 0.9 % bolus 500 mL (0 mLs Intravenous Stopped 04/30/17 1715)  cefTRIAXone (ROCEPHIN) 1 g in dextrose 5 % 50 mL IVPB - Premix (0 g Intravenous Stopped 04/30/17 1821)     NEW OUTPATIENT MEDICATIONS STARTED DURING THIS VISIT:  New Prescriptions   No medications on file    Modified Medications   No medications on file    Discontinued Medications   No medications on file     Note:  This document was prepared using Dragon voice recognition software and may include unintentional dictation errors.   Loleta Rose, MD 04/30/17 [id number]

## 2017-04-30 NOTE — H&P (Signed)
[facility] [place] [facility]   PATIENT NAME: Jose [name]    MR#:  [id number]  DATE OF BIRTH:  19-Dec-1928  DATE OF ADMISSION:  04/30/2017  PRIMARY CARE PHYSICIAN: Malva Limes, MD   REQUESTING/REFERRING PHYSICIAN: Dr. Loleta Rose  CHIEF COMPLAINT:   Chief Complaint  Patient presents with  . Fall  . Emesis    HISTORY OF PRESENT ILLNESS:  Jose [name]  is a 83 y.o. male with a known history of Chronic kidney disease, depression, history of previous UTIs, history of nephrolithiasis, hypertension, BPH, who presented to the hospital due to a presyncopal episode and feeling dizzy.  Patient went to visit his daughter at [facility] [employer] and had a presyncopal episode and felt dizzy but did not completely lose consciousness and therefore was brought to the ER for further evaluation. Emergency room patient underwent workup which was consistent with a urinary tract infection, and hospitalist services were contacted further treatment and evaluation. Patient complains of intermittent dizziness ongoing for the past few months, denies any true syncope, denies any shortness of breath. Patient also says that he's had some urinary frequency and also that his urine has been somewhat follow the past couple weeks but did not seek any help.  PAST MEDICAL HISTORY:   Past Medical History:  Diagnosis Date  . Allergy   . BPH (benign prostatic hyperplasia)   . Chronic kidney disease   . DDD (degenerative disc disease), lumbar   . Depression   . Diverticulosis   . Elevated PSA   . Gross hematuria   . Gynecomastia   . Headache   . History of kidney stones   . HTN (hypertension)   . Incomplete bladder emptying   . Nodular prostate with urinary obstruction   . Organic impotence   . OSA (obstructive sleep apnea)   . Renal colic   . Rosacea   . Sciatica   . Urinary frequency     PAST SURGICAL HISTORY:   Past Surgical History:  Procedure Laterality Date  . ANGIOPLASTY   2001   PTCA and stenting of LAD by Dr. Juliann Pares  . CATARACT EXTRACTION Bilateral 07/2006  . CT Scan of head  12/01/2004   Normal  . DOPPLER ECHOCARDIOGRAPHY  03/19/2013   Normal; Moderate global LV dysfunction. EF=45%. Mild Aortic insufficiency  . Double Ureter stent placement  08/1999   Double J Ureter stent placement  . EXTRACORPOREAL SHOCK WAVE LITHOTRIPSY Right 04/29/2016   Procedure: EXTRACORPOREAL SHOCK WAVE LITHOTRIPSY (ESWL);  Surgeon: Vanna Scotland, MD;  Location: [facility] ORS;  Service: Urology;  Laterality: Right;  . MRI CERVICAL SPINE WO CONTRAST ([facility] HX)  01/28/2010   Abnormal Results; Arhritis and bulging discs. Referral to Neurosurgery  . MRI CERVICAL SPINE WO CONTRAST ([facility] HX)  10/19/2005   Abnormal, Bone spurs  . Sleep study  10/06/2011   Severe sleep apnea. AHI= 35.4 per hr. Done at [facility]  . SPIROMETRY  09/11/2007   Moderately Severe obstruction  . TRANSURETHRAL RESECTION OF PROSTATE      SOCIAL HISTORY:   Social History  Substance Use Topics  . Smoking status: Former Smoker    Packs/day: 3.00    Years: 25.00    Types: Cigarettes    Quit date: 12/27/1978  . Smokeless tobacco: Never Used  . Alcohol use No    FAMILY HISTORY:   Family History  Problem Relation Age of Onset  . Heart disease Father   . Diabetes Father  type 2  . Hyperlipidemia Father   . Stroke Father   . Kidney disease Father   . Hypertension Other   . Breast cancer Other   . Lung cancer Other   . Alcohol abuse Paternal Uncle   . Cancer Sister   . Cancer Son   . Prostate cancer Neg Hx     DRUG ALLERGIES:   Allergies  Allergen Reactions  . Finasteride     Other reaction(s): Other (See Comments) PAIN IN BREAST    REVIEW OF SYSTEMS:   Review of Systems  Constitutional: Negative for fever and weight loss.  HENT: Negative for congestion, nosebleeds and tinnitus.   Eyes: Negative for blurred vision, double vision and redness.  Respiratory: Negative for cough,  hemoptysis and shortness of breath.   Cardiovascular: Negative for chest pain, orthopnea, leg swelling and PND.  Gastrointestinal: Negative for abdominal pain, diarrhea, melena, nausea and vomiting.  Genitourinary: Positive for frequency. Negative for dysuria, hematuria and urgency.  Musculoskeletal: Negative for falls and joint pain.  Neurological: Positive for dizziness and weakness. Negative for tingling, sensory change, focal weakness, seizures and headaches.  Endo/Heme/Allergies: Negative for polydipsia. Does not bruise/bleed easily.  Psychiatric/Behavioral: Negative for depression and memory loss. The patient is not nervous/anxious.     MEDICATIONS AT HOME:   Prior to Admission medications   Medication Sig Start Date End Date Taking? Authorizing Provider  acetaminophen (TYLENOL) 500 MG tablet Take 500 mg by mouth every 8 (eight) hours as needed.   Yes [provider]  bisoprolol-hydrochlorothiazide [facility]) 5-6.25 MG tablet take 1 tablet by mouth once daily 09/17/16  Yes Fisher, Demetrios Isaacs, MD  simvastatin (ZOCOR) 40 MG tablet Take 1 tablet by mouth daily. 01/21/16  Yes [provider]  naproxen (NAPROSYN) 500 MG tablet Take 1 tablet (500 mg total) by mouth 2 (two) times daily with a meal. Patient not taking: Reported on 04/30/2017 02/25/17   Malva Limes, MD  tamsulosin (FLOMAX) 0.4 MG CAPS capsule Take 1 capsule (0.4 mg total) by mouth daily. Patient not taking: Reported on 03/17/2017 04/29/16   Vanna Scotland, MD      VITAL SIGNS:  Blood pressure (!) 151/61, pulse 78, temperature 99.3 F (37.4 C), temperature source Oral, resp. rate (!) 25, SpO2 95 %.  PHYSICAL EXAMINATION:  Physical Exam  GENERAL:  83 y.o.-year-old patient lying in bed in no acute distress.  EYES: Pupils equal, round, reactive to light and accommodation. No scleral icterus. Extraocular muscles intact.  HEENT: Head atraumatic, normocephalic. Oropharynx and nasopharynx clear. No oropharyngeal  erythema, moist oral mucosa  NECK:  Supple, no jugular venous distention. No thyroid enlargement, no tenderness.  LUNGS: Normal breath sounds bilaterally, no wheezing, rales, rhonchi. No use of accessory muscles of respiration.  CARDIOVASCULAR: S1, S2 RRR. No murmurs, rubs, gallops, clicks.  ABDOMEN: Soft, nontender, nondistended. Bowel sounds present. No organomegaly or mass.  EXTREMITIES: No pedal edema, cyanosis, or clubbing. + 2 pedal & radial pulses b/l.   NEUROLOGIC: Cranial nerves II through XII are intact. No focal Motor or sensory deficits appreciated b/l. Globally weak. PSYCHIATRIC: The patient is alert and oriented x 3.  SKIN: No obvious rash, lesion, or ulcer.   LABORATORY PANEL:   CBC  Recent Labs Lab 04/30/17 1514  WBC 20.0*  HGB 14.9  HCT 46.5  PLT 210   ------------------------------------------------------------------------------------------------------------------  Chemistries   Recent Labs Lab 04/30/17 1514  NA 142  K 3.7  CL 106  CO2 27  GLUCOSE 143*  BUN  19  CREATININE 1.10  CALCIUM 9.0  MG 1.7  AST 21  ALT 13*  ALKPHOS 38  BILITOT 0.9   ------------------------------------------------------------------------------------------------------------------  Cardiac Enzymes  Recent Labs Lab 04/30/17 1514  TROPONINI <0.03   ------------------------------------------------------------------------------------------------------------------  RADIOLOGY:  Dg Chest 2 View  Result Date: 04/30/2017 CLINICAL DATA:  Status post fall, with syncope. Concern for chest injury. Dry heaves and vomiting. Initial encounter. EXAM: CHEST  2 VIEW COMPARISON:  Chest radiograph performed 06/09/2012 FINDINGS: The lungs are well-aerated. Minimal left basilar scarring is noted. There is no evidence of focal opacification, pleural effusion or pneumothorax. The heart is normal in size; the mediastinal contour is within normal limits. No acute osseous abnormalities are seen.  IMPRESSION: No acute cardiopulmonary process seen. No displaced rib fractures identified. Electronically Signed   By: Roanna RaiderJeffery  Chang M.D.   On: 04/30/2017 17:11   Ct Head Wo Contrast  Result Date: 04/30/2017 CLINICAL DATA:  83 year old male with fall and head injury today. Initial encounter. EXAM: CT HEAD WITHOUT CONTRAST TECHNIQUE: Contiguous axial images were obtained from the base of the skull through the vertex without intravenous contrast. COMPARISON:  07/08/2010 head CT FINDINGS: Brain: No evidence of acute infarction, hemorrhage, hydrocephalus, extra-axial collection or mass lesion/mass effect. Chronic small-vessel white matter ischemic changes again noted. Vascular: Intracranial atherosclerotic calcifications again noted. Skull: Normal. Negative for fracture or focal lesion. Sinuses/Orbits: No acute finding. Other: None. IMPRESSION: No evidence of acute intracranial abnormality. Chronic small-vessel white matter ischemic changes. Electronically Signed   By: Harmon PierJeffrey  Hu M.D.   On: 04/30/2017 17:03   Ct Renal Stone Study  Result Date: 04/30/2017 CLINICAL DATA:  Status post fall, with dry heaves and vomiting. Syncope. Generalized weakness. Initial encounter. EXAM: CT ABDOMEN AND PELVIS WITHOUT CONTRAST TECHNIQUE: Multidetector CT imaging of the abdomen and pelvis was performed following the standard protocol without IV contrast. COMPARISON:  CT of the abdomen and pelvis from 04/15/2016 FINDINGS: Lower chest: Diffuse coronary artery calcifications are seen. Calcification is noted at the mitral valve. Minimal bibasilar atelectasis is noted. Hepatobiliary: The liver is unremarkable in appearance. The gallbladder is unremarkable in appearance. The common bile duct remains normal in caliber. Pancreas: The pancreas is within normal limits. Spleen: The spleen is unremarkable in appearance. Adrenals/Urinary Tract: The adrenal glands are grossly unremarkable in appearance. Scattered bilateral renal cysts are seen,  measuring up to 5.8 cm in size. Nonspecific perinephric stranding is noted bilaterally. Scattered nonobstructing bilateral renal stones are identified, measuring up to 4 mm in size. No obstructing ureteral stones are identified. There is no evidence of hydronephrosis. Stomach/Bowel: The stomach is unremarkable in appearance. The small bowel is within normal limits. The appendix is normal in caliber, without evidence of appendicitis. Scattered diverticulosis is noted along the entirety of the colon, most prominent along the proximal to mid sigmoid colon, without evidence of diverticulitis. Vascular/Lymphatic: Diffuse calcification is seen along the abdominal aorta and its branches. No retroperitoneal or pelvic sidewall lymphadenopathy is seen. Reproductive: The bladder is mildly distended and grossly unremarkable. The prostate is enlarged, measuring 5.9 cm in transverse dimension, with scattered calcification. Other: No additional soft tissue abnormalities are seen. Musculoskeletal: No acute osseous abnormalities are identified. Facet disease is noted along the lumbar spine. Anterior and lateral bridging osteophytes are noted along the lower thoracic and lumbar spine. The visualized musculature is unremarkable in appearance. IMPRESSION: 1. No evidence of traumatic injury to the abdomen or pelvis. 2. Scattered nonobstructing bilateral renal stones measure up to 4 mm in size. 3.  Diffuse aortic atherosclerosis. 4. Scattered bilateral renal cysts. 5. Diverticulosis along the entirety of the colon, most prominent along the proximal to mid sigmoid colon, without evidence of diverticulitis. 6. Diffuse coronary artery calcifications seen. Calcification at the mitral valve. 7. Enlarged prostate noted. 8. Mild degenerative change along the lower thoracic and lumbar spine. Electronically Signed   By: Roanna Raider M.D.   On: 04/30/2017 18:23     IMPRESSION AND PLAN:   83 year old male with past medical history of a BPH,  nephrolithiasis, hypertension, previous history of urinary tract infection, degenerative disc disease, who presented to the hospital due to a presyncopal event and dizziness and noted to have a urinary tract infection.  1. Sepsis-patient meets criteria given leukocytosis, abnormal urinalysis and also tachypnea. -We'll treat the patient with IV fluids, IV ceftriaxone, follow urine, blood cultures.  2. Urinary tract infection-source of patient's sepsis. Continue IV ceftriaxone, follow urine cultures.  3. Leukocytosis-secondary to the urinary tract infection. Follow with IV antibiotic therapy  4. Presyncope-secondary to dehydration and underlying urinary tract infection. No evidence of neurogenic/cardiogenic source. CT head negative for acute pathology. We'll check orthostatic vital signs.  5. Essential hypertension-continue bisoprolol/HCTZ  6. Hyperlipidemia-continue simvastatin.    All the records are reviewed and case discussed with ED provider. Management plans discussed with the patient, family and they are in agreement.  CODE STATUS: Full code  TOTAL TIME TAKING CARE OF THIS PATIENT: 45 minutes.    Houston Siren M.D on 04/30/2017 at 7:04 PM  Between 7am to 6pm - Pager - [phone]  After 6pm go to [email or web address] - password [email or web address] [facility]  [place] [facility]  Office  [phone]  CC: Primary care physician; Malva Limes, MD

## 2017-04-30 NOTE — ED Triage Notes (Signed)
Pt arrived via POV with church friend after falling at [employer] [employer] while visiting daughter. Reported that patient had syncopal episode and fell on the right side of his face. Since the fall patient has been dry heaving and vomiting. Pt dry heaving in triage. Pt is alert and oriented c/o feeling weak. Hx of MI with stents.

## 2017-05-01 ENCOUNTER — Inpatient Hospital Stay: Payer: Medicare HMO

## 2017-05-01 LAB — BASIC METABOLIC PANEL
Anion gap: 5 (ref 5–15)
BUN: 18 mg/dL (ref 6–20)
CALCIUM: 8.2 mg/dL — AB (ref 8.9–10.3)
CO2: 28 mmol/L (ref 22–32)
Chloride: 106 mmol/L (ref 101–111)
Creatinine, Ser: 1.17 mg/dL (ref 0.61–1.24)
GFR calc Af Amer: >60 mL/min (ref >60)
GFR, EST NON AFRICAN AMERICAN: 54 mL/min — AB (ref >60)
GLUCOSE: 163 mg/dL — AB (ref 65–99)
Potassium: 3.4 mmol/L — ABNORMAL LOW (ref 3.5–5.1)
SODIUM: 139 mmol/L (ref 135–145)

## 2017-05-01 LAB — CBC
HEMATOCRIT: 40.4 % (ref 40.0–52.0)
Hemoglobin: 13.5 g/dL (ref 13.0–18.0)
MCH: 29.7 pg (ref 26.0–34.0)
MCHC: 33.5 g/dL (ref 32.0–36.0)
MCV: 88.8 fL (ref 80.0–100.0)
PLATELETS: 171 10*3/uL (ref 150–440)
RBC: 4.55 MIL/uL (ref 4.40–5.90)
RDW: 14 % (ref 11.5–14.5)
WBC: 18.2 10*3/uL — AB (ref 3.8–10.6)

## 2017-05-01 MED ORDER — DOCUSATE SODIUM 100 MG PO CAPS
100.0000 mg | ORAL_CAPSULE | Freq: Two times a day (BID) | ORAL | Status: DC
  Administered 2017-05-01 – 2017-05-02 (×3): 100 mg via ORAL
  Filled 2017-05-01 (×4): qty 1

## 2017-05-01 MED ORDER — ALBUTEROL SULFATE (2.5 MG/3ML) 0.083% IN NEBU
2.5000 mg | INHALATION_SOLUTION | RESPIRATORY_TRACT | Status: DC | PRN
  Administered 2017-05-01: 2.5 mg via RESPIRATORY_TRACT
  Filled 2017-05-01: qty 3

## 2017-05-01 MED ORDER — POTASSIUM CHLORIDE CRYS ER 20 MEQ PO TBCR
40.0000 meq | EXTENDED_RELEASE_TABLET | Freq: Once | ORAL | Status: AC
  Administered 2017-05-01: 40 meq via ORAL
  Filled 2017-05-01: qty 2

## 2017-05-01 MED ORDER — BISACODYL 10 MG RE SUPP: 10.0000 mg | Freq: Every day | RECTAL | Status: DC | PRN

## 2017-05-01 MED ORDER — DEXTROSE 5 % IV SOLN
1.0000 g | INTRAVENOUS | Status: DC
  Administered 2017-05-01 – 2017-05-02 (×2): 1 g via INTRAVENOUS
  Filled 2017-05-01 (×3): qty 10

## 2017-05-01 MED ORDER — POTASSIUM CHLORIDE CRYS ER 20 MEQ PO TBCR: 40.0000 meq | EXTENDED_RELEASE_TABLET | Freq: Once | ORAL | Status: DC

## 2017-05-01 NOTE — Progress Notes (Signed)
Physical Therapy Evaluation Patient Details Name: Jose HuhWilliam E Markwood Jr. MRN: [id number] DOB: 08/06/1928 Today's Date: 05/01/2017   History of Present Illness  Jose [name]  is a 83 y.o. male with a known history of Chronic kidney disease, depression, history of previous UTIs, history of nephrolithiasis, hypertension, BPH, who presented to the hospital due to a presyncopal episode and feeling dizzy.  Patient went to visit his daughter at [facility] [employer] and had a presyncopal episode and felt dizzy but did not completely lose consciousness and therefore was brought to the ER for further evaluation. Emergency room patient underwent workup which was consistent with a urinary tract infection, and hospitalist services were contacted further treatment and evaluation. Patient complains of intermittent dizziness ongoing for the past few months, denies any true syncope, denies any shortness of breath. Patient also says that he's had some urinary frequency and also that his urine has been somewhat follow the past couple weeks but did not seek any help.  Clinical Impression  Patient performs bed mobility with modified Independence and transfers sit to stand with modified independence using RW.  He ambulates with RW 300 feet with SBA and no loss of balance. He reports constant back pain that is minimal today 2/10.  His strength of BLE is 3+/5.  He used a spc at home and was recommended to use a RW and be followed by HHPT after DC. He needs to perform steps as he has 5 steps to get into his home. He will benefit from skilled PT to improve mobility, gait and safety.     Follow Up Recommendations Home health PT    Equipment Recommendations  Rolling walker with 5" wheels    Recommendations for Other Services       Precautions / Restrictions Restrictions Weight Bearing Restrictions: Yes LUE Weight Bearing: Weight bearing as tolerated RLE Weight Bearing: Weight bearing as tolerated LLE Weight Bearing: Weight bearing  as tolerated      Mobility  Bed Mobility Overal bed mobility: Independent                Transfers Overall transfer level: Modified independent Equipment used: Rolling walker (2 wheeled)             General transfer comment:  (cues for hand placement)  Ambulation/Gait Ambulation/Gait assistance: Modified independent (Device/Increase time) Ambulation Distance (Feet):  (300) Assistive device: Rolling walker (2 wheeled) Gait Pattern/deviations: Step-to pattern Gait velocity:  (slow gait speed)      Stairs            Wheelchair Mobility    Modified Rankin (Stroke Patients Only)       Balance Overall balance assessment: Modified Independent                               Standardized Balance Assessment Standardized Balance Assessment :  (unable to tandem stand, unable to single leg stand)           Pertinent Vitals/Pain Pain Assessment: 0-10 Pain Score: 2  Pain Location:  (aching) Pain Descriptors / Indicators: Aching Pain Intervention(s): Monitored during session    Home Living Family/patient expects to be discharged to:: Private residence Living Arrangements: Alone   Type of Home: House Home Access: Stairs to enter Entrance Stairs-Rails: Right Entrance Stairs-Number of Steps:  (5) Home Layout: One level        Prior Function Level of Independence: Independent  Hand Dominance        Extremity/Trunk Assessment   Upper Extremity Assessment Upper Extremity Assessment: Overall WFL for tasks assessed    Lower Extremity Assessment Lower Extremity Assessment: Overall WFL for tasks assessed    Cervical / Trunk Assessment Cervical / Trunk Assessment: Normal  Communication      Cognition Arousal/Alertness: Awake/alert Behavior During Therapy: WFL for tasks assessed/performed                                          General Comments      Exercises General Exercises - Lower  Extremity Long Arc Quad: Strengthening;AROM;10 reps Straight Leg Raises: AROM;Strengthening;10 reps   Assessment/Plan    PT Assessment Patient needs continued PT services  PT Problem List Decreased strength;Decreased activity tolerance;Decreased mobility;Pain       PT Treatment Interventions Gait training;Stair training;Therapeutic exercise    PT Goals (Current goals can be found in the Care Plan section)  Acute Rehab PT Goals Patient Stated Goal: to go home PT Goal Formulation: With patient Time For Goal Achievement: 05/15/17 Potential to Achieve Goals: Good    Frequency Min 2X/week   Barriers to discharge        Co-evaluation               AM-PAC PT "6 Clicks" Daily Activity  Outcome Measure Difficulty turning over in bed (including adjusting bedclothes, sheets and blankets)?: None Difficulty moving from lying on back to sitting on the side of the bed? : None Difficulty sitting down on and standing up from a chair with arms (e.g., wheelchair, bedside commode, etc,.)?: A Little Help needed moving to and from a bed to chair (including a wheelchair)?: A Little Help needed walking in hospital room?: A Little Help needed climbing 3-5 steps with a railing? :  (not performed) 6 Click Score: 17    End of Session Equipment Utilized During Treatment: Gait belt Activity Tolerance: Patient tolerated treatment well Patient left: in bed   PT Visit Diagnosis: Unsteadiness on feet (R26.81);Muscle weakness (generalized) (M62.81)    Time: 1610-9604 PT Time Calculation (min) (ACUTE ONLY): 40 min   Charges:   PT Evaluation $PT Eval Low Complexity: 1 Procedure PT Treatments $Gait Training: 8-22 mins $Neuromuscular Re-education: 8-22 mins   PT G Codes:   PT G-Codes **NOT FOR INPATIENT CLASS** Functional Assessment Tool Used: Clinical judgement Functional Limitation: Mobility: Walking and moving around Mobility: Walking and Moving Around Current Status (V4098): At least 20  percent but less than 40 percent impaired, limited or restricted Mobility: Walking and Moving Around Discharge Status [phone]): At least 1 percent but less than 20 percent impaired, limited or restricted   Ezekiel Ina, PT, DPT  Jones Broom S 05/01/2017, 12:17 PM

## 2017-05-01 NOTE — Progress Notes (Signed)
[facility] - [place] at [facility]   PATIENT NAME: Jose [name]    MR#:  [id number]  DATE OF BIRTH:  16-Oct-1928  SUBJECTIVE:  CHIEF COMPLAINT:  Patient is resting comfortably. Denies any abdominal pain or back pain. Denies any nausea vomiting. Denies dysuria  REVIEW OF SYSTEMS:  CONSTITUTIONAL: No fever, fatigue or weakness.  EYES: No blurred or double vision.  EARS, NOSE, AND THROAT: No tinnitus or ear pain.  RESPIRATORY: No cough, shortness of breath, wheezing or hemoptysis.  CARDIOVASCULAR: No chest pain, orthopnea, edema.  GASTROINTESTINAL: No nausea, vomiting, diarrhea or abdominal pain.  GENITOURINARY: No dysuria, hematuria.  ENDOCRINE: No polyuria, nocturia,  HEMATOLOGY: No anemia, easy bruising or bleeding SKIN: No rash or lesion. MUSCULOSKELETAL: No joint pain or arthritis.   NEUROLOGIC: No tingling, numbness, weakness.  PSYCHIATRY: No anxiety or depression.   DRUG ALLERGIES:   Allergies  Allergen Reactions  . Finasteride     Other reaction(s): Other (See Comments) PAIN IN BREAST    VITALS:  Blood pressure (!) 131/51, pulse (!) 106, temperature 98.6 F (37 C), temperature source Oral, resp. rate 18, weight 88 kg (194 lb 1.6 oz), SpO2 95 %.  PHYSICAL EXAMINATION:  GENERAL:  83 y.o.-year-old patient lying in the bed with no acute distress.  EYES: Pupils equal, round, reactive to light and accommodation. No scleral icterus. Extraocular muscles intact.  HEENT: Head atraumatic, normocephalic. Oropharynx and nasopharynx clear.  NECK:  Supple, no jugular venous distention. No thyroid enlargement, no tenderness.  LUNGS: Normal breath sounds bilaterally, no wheezing, rales,rhonchi or crepitation. No use of accessory muscles of respiration.  CARDIOVASCULAR: S1, S2 normal. No murmurs, rubs, or gallops.  ABDOMEN: Soft, nontender, nondistended. Bowel sounds present. No organomegaly or mass.  EXTREMITIES: No pedal edema, cyanosis, or clubbing.   NEUROLOGIC: Cranial nerves II through XII are intact. Muscle strength 5/5 in all extremities. Sensation intact. Gait not checked.  PSYCHIATRIC: The patient is alert and oriented x 3.  SKIN: No obvious rash, lesion, or ulcer.    LABORATORY PANEL:   CBC  Recent Labs Lab 05/01/17 0417  WBC 18.2*  HGB 13.5  HCT 40.4  PLT 171   ------------------------------------------------------------------------------------------------------------------  Chemistries   Recent Labs Lab 04/30/17 1514 05/01/17 0417  NA 142 139  K 3.7 3.4*  CL 106 106  CO2 27 28  GLUCOSE 143* 163*  BUN 19 18  CREATININE 1.10 1.17  CALCIUM 9.0 8.2*  MG 1.7  --   AST 21  --   ALT 13*  --   ALKPHOS 38  --   BILITOT 0.9  --    ------------------------------------------------------------------------------------------------------------------  Cardiac Enzymes  Recent Labs Lab 04/30/17 1514  TROPONINI <0.03   ------------------------------------------------------------------------------------------------------------------  RADIOLOGY:  Dg Chest 2 View  Result Date: 04/30/2017 CLINICAL DATA:  Status post fall, with syncope. Concern for chest injury. Dry heaves and vomiting. Initial encounter. EXAM: CHEST  2 VIEW COMPARISON:  Chest radiograph performed 06/09/2012 FINDINGS: The lungs are well-aerated. Minimal left basilar scarring is noted. There is no evidence of focal opacification, pleural effusion or pneumothorax. The heart is normal in size; the mediastinal contour is within normal limits. No acute osseous abnormalities are seen. IMPRESSION: No acute cardiopulmonary process seen. No displaced rib fractures identified. Electronically Signed   By: Roanna Raider M.D.   On: 04/30/2017 17:11   Ct Head Wo Contrast  Result Date: 04/30/2017 CLINICAL DATA:  83 year old male with fall and head injury today. Initial encounter. EXAM: CT HEAD WITHOUT CONTRAST  TECHNIQUE: Contiguous axial images were obtained from the  base of the skull through the vertex without intravenous contrast. COMPARISON:  07/08/2010 head CT FINDINGS: Brain: No evidence of acute infarction, hemorrhage, hydrocephalus, extra-axial collection or mass lesion/mass effect. Chronic small-vessel white matter ischemic changes again noted. Vascular: Intracranial atherosclerotic calcifications again noted. Skull: Normal. Negative for fracture or focal lesion. Sinuses/Orbits: No acute finding. Other: None. IMPRESSION: No evidence of acute intracranial abnormality. Chronic small-vessel white matter ischemic changes. Electronically Signed   By: Harmon PierJeffrey  Hu M.D.   On: 04/30/2017 17:03   Ct Renal Stone Study  Result Date: 04/30/2017 CLINICAL DATA:  Status post fall, with dry heaves and vomiting. Syncope. Generalized weakness. Initial encounter. EXAM: CT ABDOMEN AND PELVIS WITHOUT CONTRAST TECHNIQUE: Multidetector CT imaging of the abdomen and pelvis was performed following the standard protocol without IV contrast. COMPARISON:  CT of the abdomen and pelvis from 04/15/2016 FINDINGS: Lower chest: Diffuse coronary artery calcifications are seen. Calcification is noted at the mitral valve. Minimal bibasilar atelectasis is noted. Hepatobiliary: The liver is unremarkable in appearance. The gallbladder is unremarkable in appearance. The common bile duct remains normal in caliber. Pancreas: The pancreas is within normal limits. Spleen: The spleen is unremarkable in appearance. Adrenals/Urinary Tract: The adrenal glands are grossly unremarkable in appearance. Scattered bilateral renal cysts are seen, measuring up to 5.8 cm in size. Nonspecific perinephric stranding is noted bilaterally. Scattered nonobstructing bilateral renal stones are identified, measuring up to 4 mm in size. No obstructing ureteral stones are identified. There is no evidence of hydronephrosis. Stomach/Bowel: The stomach is unremarkable in appearance. The small bowel is within normal limits. The appendix is  normal in caliber, without evidence of appendicitis. Scattered diverticulosis is noted along the entirety of the colon, most prominent along the proximal to mid sigmoid colon, without evidence of diverticulitis. Vascular/Lymphatic: Diffuse calcification is seen along the abdominal aorta and its branches. No retroperitoneal or pelvic sidewall lymphadenopathy is seen. Reproductive: The bladder is mildly distended and grossly unremarkable. The prostate is enlarged, measuring 5.9 cm in transverse dimension, with scattered calcification. Other: No additional soft tissue abnormalities are seen. Musculoskeletal: No acute osseous abnormalities are identified. Facet disease is noted along the lumbar spine. Anterior and lateral bridging osteophytes are noted along the lower thoracic and lumbar spine. The visualized musculature is unremarkable in appearance. IMPRESSION: 1. No evidence of traumatic injury to the abdomen or pelvis. 2. Scattered nonobstructing bilateral renal stones measure up to 4 mm in size. 3. Diffuse aortic atherosclerosis. 4. Scattered bilateral renal cysts. 5. Diverticulosis along the entirety of the colon, most prominent along the proximal to mid sigmoid colon, without evidence of diverticulitis. 6. Diffuse coronary artery calcifications seen. Calcification at the mitral valve. 7. Enlarged prostate noted. 8. Mild degenerative change along the lower thoracic and lumbar spine. Electronically Signed   By: Roanna RaiderJeffery  Chang M.D.   On: 04/30/2017 18:23    EKG:   Orders placed or performed during the hospital encounter of 04/30/17  . EKG 12-Lead  . EKG 12-Lead  . ED EKG  . ED EKG    ASSESSMENT AND PLAN:   83 year old male with past medical history of a BPH, nephrolithiasis, hypertension, previous history of urinary tract infection, degenerative disc disease, who presented to the hospital due to a presyncopal event and dizziness and noted to have a urinary tract infection.  1. Sepsis-patient meets  criteria given leukocytosis, abnormal urinalysis and also tachypnea. -We'll Continue to treat the patient with IV fluids, IV ceftriaxone, follow  urine, blood cultures.  2. Urinary tract infection-source of patient's sepsis. Continue IV ceftriaxone, follow urine cultures.  3. Leukocytosis-secondary to the urinary tract infection. Follow with IV antibiotic therapy. Repeat a.m. labs  4. Presyncope-secondary to dehydration and underlying urinary tract infection. No evidence of neurogenic/cardiogenic source. CT head negative for acute pathology. We'll check orthostatic vital signs.  5. Essential hypertension-continue bisoprolol/HCTZ  6. Hyperlipidemia-continue simvastatin.  7. Constipation positive flatus. Will provide Colace 100 mg by mouth twice a day and Dulcolax suppository. Enema as needed  Physical therapy is recommending home health PT with 5 inch rolling walker     All the records are reviewed and case discussed with Care Management/Social Workerr. Management plans discussed with the patient, family and they are in agreement.  CODE STATUS: fc   TOTAL TIME TAKING CARE OF THIS PATIENT: 36  minutes.   POSSIBLE D/C IN 1-2  DAYS, DEPENDING ON CLINICAL CONDITION.  Note: This dictation was prepared with Dragon dictation along with smaller phrase technology. Any transcriptional errors that result from this process are unintentional.   Ramonita Lab M.D on 05/01/2017 at 1:21 PM  Between 7am to 6pm - Pager - [phone] After 6pm go to [email or web address] - password [email or web address] [facility]  [place]  Office  [phone]  CC: Primary care physician; Malva Limes, MD

## 2017-05-01 NOTE — Care Management Note (Signed)
Case Management Note  Patient Details  Name: Simona HuhWilliam E Dara Jr. MRN: [id number] Date of Birth: 03/08/1928  Subjective/Objective:       Mr Samuella Cotarice resides alone but has 5 adult children who visit frequently and provide him with any assistance needed. He drives himself to appointments. PCP=Dr Mila Merryonald Fisher. Pharmacy=[facility] [address] in [place]. Mr Samuella Cotarice has several canes at home but states that he would benefit from having a front wheel rolling walker. He was fitted with a back brace by an orthopedist for his DDD (degenerative disc disease) in March of 2018, but admits that he does not wear it. He has no home oxygen and no home health services. Mr Horgan verbalized no preference of Home Health providers. [facility]-PT recommended HH-PT. Case management will follow for discharge planning.    Needs MD order for a RW.          Action/Plan:   Expected Discharge Date:                  Expected Discharge Plan:     In-House Referral:     Discharge planning Services     Post Acute Care Choice:    Choice offered to:     DME Arranged:    DME Agency:     HH Arranged:    HH Agency:     Status of Service:     If discussed at [employer] Length of Stay Meetings, dates discussed:    Additional Comments:  Tereasa Yilmaz A, RN 05/01/2017, 3:38 PM

## 2017-05-01 NOTE — Progress Notes (Signed)
Dr Anne HahnWillis notified of pt wheezing, orders given

## 2017-05-02 LAB — BASIC METABOLIC PANEL
Anion gap: 7 (ref 5–15)
BUN: 21 mg/dL — AB (ref 6–20)
CO2: 28 mmol/L (ref 22–32)
CREATININE: 1.24 mg/dL (ref 0.61–1.24)
Calcium: 8 mg/dL — ABNORMAL LOW (ref 8.9–10.3)
Chloride: 104 mmol/L (ref 101–111)
GFR calc Af Amer: 58 mL/min — ABNORMAL LOW (ref >60)
GFR, EST NON AFRICAN AMERICAN: 50 mL/min — AB (ref >60)
GLUCOSE: 148 mg/dL — AB (ref 65–99)
Potassium: 3.8 mmol/L (ref 3.5–5.1)
SODIUM: 139 mmol/L (ref 135–145)

## 2017-05-02 LAB — CBC
HCT: 40.9 % (ref 40.0–52.0)
Hemoglobin: 13.6 g/dL (ref 13.0–18.0)
MCH: 30 pg (ref 26.0–34.0)
MCHC: 33.4 g/dL (ref 32.0–36.0)
MCV: 90.1 fL (ref 80.0–100.0)
PLATELETS: 162 10*3/uL (ref 150–440)
RBC: 4.54 MIL/uL (ref 4.40–5.90)
RDW: 13.9 % (ref 11.5–14.5)
WBC: 15.7 10*3/uL — AB (ref 3.8–10.6)

## 2017-05-02 MED ORDER — SODIUM CHLORIDE 0.9 % IV SOLN
INTRAVENOUS | Status: AC
  Administered 2017-05-02: 16:00:00 via INTRAVENOUS

## 2017-05-02 NOTE — Care Management Note (Signed)
Case Management Note  Patient Details  Name: Jose HuhWilliam E Filkins Jr. MRN: [id number] Date of Birth: 07/15/1928  Subjective/Objective:  Spoke with patient regarding home health PT. He states he already is getting outpatient rehab but cant think of the name of the place.He states he drives there 1 times a week on Wednesdays. Or[facility] rolling [facility]er from [facility]PCP is Mila Merryonald fisher. No other needs identified. Case Closed.                    Action/Plan:   Expected Discharge Date:                  Expected Discharge Plan:  Home/Self Care  In-House Referral:     Discharge planning Services     Post Acute Care Choice:  Durable Medical Equipment Choice offered to:  Patient  DME Arranged:  Walker rolling DME Agency:  [facility]  HH Arranged:    HH Agency:     Status of Service:  Completed, signed off  If discussed at Long Length of Stay Meetings, dates discussed:    Additional Comments:  Jose MemosLisa M Blakeley Margraf, RN 05/02/2017, 3:49 PM

## 2017-05-02 NOTE — Progress Notes (Signed)
[facility] - [place] at [facility]   PATIENT NAME: Jose [name]    MR#:  [id number]  DATE OF BIRTH:  [birth year]  SUBJECTIVE:  CHIEF COMPLAINT:  Patient is resting comfortably. Denies any abdominal pain or back pain. Denies any nausea vomiting. Denies dysuria  REVIEW OF SYSTEMS:  CONSTITUTIONAL: No fever, fatigue or weakness.  EYES: No blurred or double vision.  EARS, NOSE, AND THROAT: No tinnitus or ear pain.  RESPIRATORY: No cough, shortness of breath, wheezing or hemoptysis.  CARDIOVASCULAR: No chest pain, orthopnea, edema.  GASTROINTESTINAL: No nausea, vomiting, diarrhea or abdominal pain.  GENITOURINARY: No dysuria, hematuria.  ENDOCRINE: No polyuria, nocturia,  HEMATOLOGY: No anemia, easy bruising or bleeding SKIN: No rash or lesion. MUSCULOSKELETAL: No joint pain or arthritis.   NEUROLOGIC: No tingling, numbness, weakness.  PSYCHIATRY: No anxiety or depression.   DRUG ALLERGIES:   Allergies  Allergen Reactions  . Finasteride     Other reaction(s): Other (See Comments) PAIN IN BREAST    VITALS:  Blood pressure (!) 156/69, pulse (!) 53, temperature 98.2 F (36.8 C), temperature source Oral, resp. rate 18, height 5\' 9"  (1.753 m), weight 88 kg (194 lb 1.6 oz), SpO2 97 %.  PHYSICAL EXAMINATION:  GENERAL:  83 y.o.-year-old patient lying in the bed with no acute distress.  EYES: Pupils equal, round, reactive to light and accommodation. No scleral icterus. Extraocular muscles intact.  HEENT: Head atraumatic, normocephalic. Oropharynx and nasopharynx clear.  NECK:  Supple, no jugular venous distention. No thyroid enlargement, no tenderness.  LUNGS: Normal breath sounds bilaterally, no wheezing, rales,rhonchi or crepitation. No use of accessory muscles of respiration.  CARDIOVASCULAR: S1, S2 normal. No murmurs, rubs, or gallops.  ABDOMEN: Soft, nontender, nondistended. Bowel sounds present. No organomegaly or mass.  EXTREMITIES: No pedal edema,  cyanosis, or clubbing.  NEUROLOGIC: Cranial nerves II through XII are intact. Muscle strength 5/5 in all extremities. Sensation intact. Gait not checked.  PSYCHIATRIC: The patient is alert and oriented x 3.  SKIN: No obvious rash, lesion, or ulcer.    LABORATORY PANEL:   CBC  Recent Labs Lab 05/02/17 0022  WBC 15.7*  HGB 13.6  HCT 40.9  PLT 162   ------------------------------------------------------------------------------------------------------------------  Chemistries   Recent Labs Lab 04/30/17 1514  05/02/17 0022  NA 142  < > 139  K 3.7  < > 3.8  CL 106  < > 104  CO2 27  < > 28  GLUCOSE 143*  < > 148*  BUN 19  < > 21*  CREATININE 1.10  < > 1.24  CALCIUM 9.0  < > 8.0*  MG 1.7  --   --   AST 21  --   --   ALT 13*  --   --   ALKPHOS 38  --   --   BILITOT 0.9  --   --   < > = values in this interval not displayed. ------------------------------------------------------------------------------------------------------------------  Cardiac Enzymes  Recent Labs Lab 04/30/17 1514  TROPONINI <0.03   ------------------------------------------------------------------------------------------------------------------  RADIOLOGY:  Dg Chest 1 View  Result Date: 05/01/2017 CLINICAL DATA:  Pt with c/o wheezing and persistent cough; admitted yesterday after a fall; hx/o CKD and HTN; former smoker. EXAM: CHEST 1 VIEW COMPARISON:  One day prior FINDINGS: Numerous leads and wires project over the chest. Midline trachea. Mild cardiomegaly with transverse aortic atherosclerosis. No pleural effusion or pneumothorax. Clear lungs. No congestive failure. IMPRESSION: No acute findings. Cardiomegaly without congestive failure. Aortic atherosclerosis. Electronically Signed  By: Jeronimo GreavesKyle  Talbot M.D.   On: 05/01/2017 15:31   Dg Chest 2 View  Result Date: 04/30/2017 CLINICAL DATA:  Status post fall, with syncope. Concern for chest injury. Dry heaves and vomiting. Initial encounter. EXAM: CHEST   2 VIEW COMPARISON:  Chest radiograph performed 06/09/2012 FINDINGS: The lungs are well-aerated. Minimal left basilar scarring is noted. There is no evidence of focal opacification, pleural effusion or pneumothorax. The heart is normal in size; the mediastinal contour is within normal limits. No acute osseous abnormalities are seen. IMPRESSION: No acute cardiopulmonary process seen. No displaced rib fractures identified. Electronically Signed   By: Roanna RaiderJeffery  Chang M.D.   On: 04/30/2017 17:11   Ct Head Wo Contrast  Result Date: 04/30/2017 CLINICAL DATA:  83 year old male with fall and head injury today. Initial encounter. EXAM: CT HEAD WITHOUT CONTRAST TECHNIQUE: Contiguous axial images were obtained from the base of the skull through the vertex without intravenous contrast. COMPARISON:  07/08/2010 head CT FINDINGS: Brain: No evidence of acute infarction, hemorrhage, hydrocephalus, extra-axial collection or mass lesion/mass effect. Chronic small-vessel white matter ischemic changes again noted. Vascular: Intracranial atherosclerotic calcifications again noted. Skull: Normal. Negative for fracture or focal lesion. Sinuses/Orbits: No acute finding. Other: None. IMPRESSION: No evidence of acute intracranial abnormality. Chronic small-vessel white matter ischemic changes. Electronically Signed   By: Harmon PierJeffrey  Hu M.D.   On: 04/30/2017 17:03   Ct Renal Stone Study  Result Date: 04/30/2017 CLINICAL DATA:  Status post fall, with dry heaves and vomiting. Syncope. Generalized weakness. Initial encounter. EXAM: CT ABDOMEN AND PELVIS WITHOUT CONTRAST TECHNIQUE: Multidetector CT imaging of the abdomen and pelvis was performed following the standard protocol without IV contrast. COMPARISON:  CT of the abdomen and pelvis from 04/15/2016 FINDINGS: Lower chest: Diffuse coronary artery calcifications are seen. Calcification is noted at the mitral valve. Minimal bibasilar atelectasis is noted. Hepatobiliary: The liver is  unremarkable in appearance. The gallbladder is unremarkable in appearance. The common bile duct remains normal in caliber. Pancreas: The pancreas is within normal limits. Spleen: The spleen is unremarkable in appearance. Adrenals/Urinary Tract: The adrenal glands are grossly unremarkable in appearance. Scattered bilateral renal cysts are seen, measuring up to 5.8 cm in size. Nonspecific perinephric stranding is noted bilaterally. Scattered nonobstructing bilateral renal stones are identified, measuring up to 4 mm in size. No obstructing ureteral stones are identified. There is no evidence of hydronephrosis. Stomach/Bowel: The stomach is unremarkable in appearance. The small bowel is within normal limits. The appendix is normal in caliber, without evidence of appendicitis. Scattered diverticulosis is noted along the entirety of the colon, most prominent along the proximal to mid sigmoid colon, without evidence of diverticulitis. Vascular/Lymphatic: Diffuse calcification is seen along the abdominal aorta and its branches. No retroperitoneal or pelvic sidewall lymphadenopathy is seen. Reproductive: The bladder is mildly distended and grossly unremarkable. The prostate is enlarged, measuring 5.9 cm in transverse dimension, with scattered calcification. Other: No additional soft tissue abnormalities are seen. Musculoskeletal: No acute osseous abnormalities are identified. Facet disease is noted along the lumbar spine. Anterior and lateral bridging osteophytes are noted along the lower thoracic and lumbar spine. The visualized musculature is unremarkable in appearance. IMPRESSION: 1. No evidence of traumatic injury to the abdomen or pelvis. 2. Scattered nonobstructing bilateral renal stones measure up to 4 mm in size. 3. Diffuse aortic atherosclerosis. 4. Scattered bilateral renal cysts. 5. Diverticulosis along the entirety of the colon, most prominent along the proximal to mid sigmoid colon, without evidence of  diverticulitis. 6.  Diffuse coronary artery calcifications seen. Calcification at the mitral valve. 7. Enlarged prostate noted. 8. Mild degenerative change along the lower thoracic and lumbar spine. Electronically Signed   By: Roanna Raider M.D.   On: 04/30/2017 18:23    EKG:   Orders placed or performed during the hospital encounter of 04/30/17  . EKG 12-Lead  . EKG 12-Lead  . ED EKG  . ED EKG    ASSESSMENT AND PLAN:   83 year old male with past medical history of a BPH, nephrolithiasis, hypertension, previous history of urinary tract infection, degenerative disc disease, who presented to the hospital due to a presyncopal event and dizziness and noted to have a urinary tract infection.  1. Sepsis-patient meets criteria given leukocytosis, abnormal urinalysis and also tachypnea. -Continue to treat the patient with IV fluids, IV ceftriaxone, Urine culture with greater than 100,000 colonies. Sensitivity pending  2. Urinary tract infection-source of patient's sepsis. Continue IV ceftriaxone, follow urine cultures, sensitivity pending.  3. Leukocytosis-secondary to the urinary tract infection. Follow with IV antibiotic therapy. Repeat a.m. labs  4. Presyncope-secondary to dehydration and underlying urinary tract infection. No evidence of neurogenic/cardiogenic source. CT head negative for acute pathology.   5. Essential hypertension-continue bisoprolol/HCTZ  6. Hyperlipidemia-continue simvastatin.  7. Constipation positive flatus. Will provide Colace 100 mg by mouth twice a day and Dulcolax suppository. Enema as needed  Physical therapy is recommending home health PT with 5 inch rolling walker     All the records are reviewed and case discussed with Care Management/Social Workerr. Management plans discussed with the patient, family and they are in agreement.  CODE STATUS: fc   TOTAL TIME TAKING CARE OF THIS PATIENT: 36  minutes.   POSSIBLE D/C IN 1-2  DAYS, DEPENDING ON  CLINICAL CONDITION.  Note: This dictation was prepared with Dragon dictation along with smaller phrase technology. Any transcriptional errors that result from this process are unintentional.   Ramonita Lab M.D on 05/02/2017 at 3:24 PM  Between 7am to 6pm - Pager - [phone] After 6pm go to [email or web address] - password [email or web address] [facility]  [facility]  Office  [phone]  CC: Primary care physician; Malva Limes, MD

## 2017-05-02 NOTE — Progress Notes (Signed)
Physical Therapy Treatment Patient Details Name: Jose HuhWilliam E Penman Jr. MRN: [id number] DOB: 12/10/1928 Today's Date: 05/02/2017    History of Present Illness Jose [name]  is a 83 y.o. male with a known history of Chronic kidney disease, depression, history of previous UTIs, history of nephrolithiasis, hypertension, BPH, who presented to the hospital due to a presyncopal episode and feeling dizzy.  Patient went to visit his daughter at [facility] and had a presyncopal episode and felt dizzy but did not completely lose consciousness and therefore was brought to the ER for further evaluation. Emergency room patient underwent workup which was consistent with a urinary tract infection, and hospitalist services were contacted further treatment and evaluation. Patient complains of intermittent dizziness ongoing for the past few months, denies any true syncope, denies any shortness of breath. Patient also says that he's had some urinary frequency and also that his urine has been somewhat follow the past couple weeks but did not seek any help.    PT Comments    Pt agreeable to PT; reports feeling sleepy, no other complaints. Pt progressing ambulation distance with supervision. No loss of balance. Ambulates at slow speed safely without apparent or subjective distress. No shortness of breath,cough or wheezing noted (was reported in previous documents). Pt has 5-6 steps in condo and would benefit from performing before discharge. Continue PT to progress endurance and strength for an optimal, safe return home.    Follow Up Recommendations  Home health PT     Equipment Recommendations  Rolling walker with 5" wheels    Recommendations for Other Services       Precautions / Restrictions Restrictions LUE Weight Bearing: Weight bearing as tolerated RLE Weight Bearing: Weight bearing as tolerated LLE Weight Bearing: Weight bearing as tolerated    Mobility  Bed Mobility               General bed  mobility comments: Not tested; up in chair  Transfers Overall transfer level: Needs assistance Equipment used: Rolling walker (2 wheeled) Transfers: Sit to/from Stand Sit to Stand: Min guard;Supervision         General transfer comment: Requires cues for safe hand placement to/from recliner and commode  Ambulation/Gait Ambulation/Gait assistance: Min guard;Supervision Ambulation Distance (Feet): 240 Feet (to/from bathroom as well) Assistive device: Rolling walker (2 wheeled) Gait Pattern/deviations: Step-through pattern;Decreased stride length (partial step through) Gait velocity: decreased Gait velocity interpretation: <1.8 ft/sec, indicative of risk for recurrent falls General Gait Details: Moderate lean on rw. No LOB   Stairs            Wheelchair Mobility    Modified Rankin (Stroke Patients Only)       Balance Overall balance assessment: Needs assistance Sitting-balance support: Feet supported;Bilateral upper extremity supported Sitting balance-Leahy Scale: Good     Standing balance support: Bilateral upper extremity supported Standing balance-Leahy Scale: Fair (Fair +, able to stand and wash hands without support)                              Cognition Arousal/Alertness: Awake/alert (Reports feeling "sleepy") Behavior During Therapy: WFL for tasks assessed/performed Overall Cognitive Status: Within Functional Limits for tasks assessed                                        Exercises      General Comments  Pertinent Vitals/Pain Pain Assessment: No/denies pain    Home Living                      Prior Function            PT Goals (current goals can now be found in the care plan section)      Frequency    Min 2X/week      PT Plan Current plan remains appropriate    Co-evaluation              AM-PAC PT "6 Clicks" Daily Activity  Outcome Measure  Difficulty turning over in bed  (including adjusting bedclothes, sheets and blankets)?: None Difficulty moving from lying on back to sitting on the side of the bed? : None Difficulty sitting down on and standing up from a chair with arms (e.g., wheelchair, bedside commode, etc,.)?: A Little Help needed moving to and from a bed to chair (including a wheelchair)?: A Little Help needed walking in hospital room?: A Little Help needed climbing 3-5 steps with a railing? : A Little 6 Click Score: 20    End of Session Equipment Utilized During Treatment: Gait belt Activity Tolerance: Patient tolerated treatment well Patient left: in chair;with call bell/phone within reach;with family/visitor present;Other (comment) (MD in room)   PT Visit Diagnosis: Unsteadiness on feet (R26.81);Muscle weakness (generalized) (M62.81)     Time: 1610-9604 PT Time Calculation (min) (ACUTE ONLY): 33 min  Charges:  $Gait Training: 8-22 mins $Therapeutic Activity: 8-22 mins                    G Codes:        Scot Dock, PTA 05/02/2017, 1:55 PM

## 2017-05-03 LAB — CBC
HCT: 42.4 % (ref 40.0–52.0)
Hemoglobin: 14 g/dL (ref 13.0–18.0)
MCH: 29.9 pg (ref 26.0–34.0)
MCHC: 33.1 g/dL (ref 32.0–36.0)
MCV: 90.3 fL (ref 80.0–100.0)
PLATELETS: 191 10*3/uL (ref 150–440)
RBC: 4.7 MIL/uL (ref 4.40–5.90)
RDW: 13.9 % (ref 11.5–14.5)
WBC: 13 10*3/uL — ABNORMAL HIGH (ref 3.8–10.6)

## 2017-05-03 LAB — URINE CULTURE: Special Requests: NORMAL

## 2017-05-03 MED ORDER — HALOPERIDOL LACTATE 5 MG/ML IJ SOLN
2.0000 mg | Freq: Once | INTRAMUSCULAR | Status: AC
  Administered 2017-05-03: 2 mg via INTRAVENOUS
  Filled 2017-05-03: qty 1

## 2017-05-03 MED ORDER — CIPROFLOXACIN HCL 500 MG PO TABS
500.0000 mg | ORAL_TABLET | Freq: Two times a day (BID) | ORAL | Status: DC
  Administered 2017-05-03: 500 mg via ORAL
  Filled 2017-05-03: qty 1

## 2017-05-03 MED ORDER — CIPROFLOXACIN HCL 500 MG PO TABS: 500.0000 mg | ORAL_TABLET | Freq: Two times a day (BID) | ORAL | 0 refills | Status: DC

## 2017-05-03 MED ORDER — DOCUSATE SODIUM 100 MG PO CAPS: 100.0000 mg | ORAL_CAPSULE | Freq: Every day | ORAL | 0 refills | Status: DC

## 2017-05-03 MED ORDER — BISACODYL 10 MG RE SUPP: 10.0000 mg | Freq: Every day | RECTAL | 0 refills | Status: DC | PRN

## 2017-05-03 NOTE — Progress Notes (Addendum)
No change in pt from AM assessment. Pt denies pain. Pt discharged to home via wheelchair without incident per MD order accompanied by son. All discharge teachings done both written and verbal. All questions answered. Son and pt agree to comply. Stressed to son the importance of someone being with pt until his intermittent confusion gets better.

## 2017-05-03 NOTE — Discharge Instructions (Signed)
Follow-up with primary care physician in a week Continue outpatient physical therapy and ambulate with rolling walker as recommended by PT

## 2017-05-03 NOTE — Discharge Summary (Signed)
[facility] - [place] at [facility]   PATIENT NAME: Jose [name]    MR#:  [id number]  DATE OF BIRTH:  [birth year]  DATE OF ADMISSION:  04/30/2017 ADMITTING PHYSICIAN: Houston Siren, MD  DATE OF DISCHARGE: 05/03/17 PRIMARY CARE PHYSICIAN: Malva Limes, MD    ADMISSION DIAGNOSIS:  Syncope and collapse [R55] Delirium [R41.0] Complicated UTI (urinary tract infection) [N39.0]  DISCHARGE DIAGNOSIS:  Active Problems:   Sepsis (HCC) Escherichia coli UTI-ESBL negative  SECONDARY DIAGNOSIS:   Past Medical History:  Diagnosis Date  . Allergy   . BPH (benign prostatic hyperplasia)   . Chronic kidney disease   . DDD (degenerative disc disease), lumbar   . Depression   . Diverticulosis   . Elevated PSA   . Gross hematuria   . Gynecomastia   . Headache   . History of kidney stones   . HTN (hypertension)   . Incomplete bladder emptying   . Nodular prostate with urinary obstruction   . Organic impotence   . OSA (obstructive sleep apnea)   . Renal colic   . Rosacea   . Sciatica   . Urinary frequency     HOSPITAL COURSE:  HPI  Jose [name]  is a 83 y.o. male with a known history of Chronic kidney disease, depression, history of previous UTIs, history of nephrolithiasis, hypertension, BPH, who presented to the hospital due to a presyncopal episode and feeling dizzy.  Patient went to visit his daughter at [facility] and had a presyncopal episode and felt dizzy but did not completely lose consciousness and therefore was brought to the ER for further evaluation. Emergency room patient underwent workup which was consistent with a urinary tract infection, and hospitalist services were contacted further treatment and evaluation. Patient complains of intermittent dizziness ongoing for the past few months, denies any true syncope, denies any shortness of breath. Patient also says that he's had some urinary frequency and also that his urine has been somewhat follow  the past couple weeks but did not seek any help.  1. Sepsis-patient meets criteria given leukocytosis, abnormal urinalysis and also tachypnea. -treated  the patient with IV fluids, IV ceftriaxone, Urine culture with greater than 100,000 colonies. Sensitivity-Escherichia coli not ESBL sensitive to ciprofloxacin. Will discharge home with by mouth ciprofloxacin  2. Urinarytract infection-source of patient's sepsis. Improved with IV ceftriaxone, urine culture with Escherichia coli sensitive to Cipro. Discharged with ciprofloxacin  3. Leukocytosis-secondary to the urinary tract infection. Trending down with antibiotics  4. Presyncope-secondary to dehydration and underlying urinary tract infection. No evidence of neurogenic/cardiogenic source. CT head negative for acute pathology.   5. Essential hypertension-continue bisoprolol/HCTZ  6. Hyperlipidemia-continue simvastatin.  7. Constipation - Relieved with  Colace 100 mg by mouth twice a day and Dulcolax suppository.   Physical therapy is recommending home health PT with 5 inch rolling walker. Patient has outpatient physical therapy and he prefers continuing outpatient physical therapy. Prescription given for rolling walker  DISCHARGE CONDITIONS:   fair  CONSULTS OBTAINED:     PROCEDURES None  DRUG ALLERGIES:   Allergies  Allergen Reactions  . Finasteride     Other reaction(s): Other (See Comments) PAIN IN BREAST    DISCHARGE MEDICATIONS:   Current Discharge Medication List    START taking these medications   Details  bisacodyl (DULCOLAX) 10 MG suppository Place 1 suppository (10 mg total) rectally daily as needed for moderate constipation. Qty: 12 suppository, Refills: 0    ciprofloxacin (CIPRO) 500 MG  tablet Take 1 tablet (500 mg total) by mouth 2 (two) times daily. Qty: 14 tablet, Refills: 0    docusate sodium (COLACE) 100 MG capsule Take 1 capsule (100 mg total) by mouth daily. Qty: 10 capsule, Refills: 0       CONTINUE these medications which have NOT CHANGED   Details  acetaminophen (TYLENOL) 500 MG tablet Take 500 mg by mouth every 8 (eight) hours as needed.    bisoprolol-hydrochlorothiazide (ZIAC) 5-6.25 MG tablet take 1 tablet by mouth once daily Qty: 30 tablet, Refills: 8    simvastatin (ZOCOR) 40 MG tablet Take 1 tablet by mouth daily. Refills: 0    tamsulosin (FLOMAX) 0.4 MG CAPS capsule Take 1 capsule (0.4 mg total) by mouth daily. Qty: 30 capsule, Refills: 0      STOP taking these medications     naproxen (NAPROSYN) 500 MG tablet          DISCHARGE INSTRUCTIONS:  Follow-up with primary care physician in a week Continue outpatient physical therapy and ambulate with rolling walker as recommended by PT    DIET:  Cardiac diet  DISCHARGE CONDITION:  Stable  ACTIVITY:  Activity as tolerated  OXYGEN:  Home Oxygen: No.   Oxygen Delivery: room air  DISCHARGE LOCATION:  home   If you experience worsening of your admission symptoms, develop shortness of breath, life threatening emergency, suicidal or homicidal thoughts you must seek medical attention immediately by calling 911 or calling your MD immediately  if symptoms less severe.  You Must read complete instructions/literature along with all the possible adverse reactions/side effects for all the Medicines you take and that have been prescribed to you. Take any new Medicines after you have completely understood and accpet all the possible adverse reactions/side effects.   Please note  You were cared for by a hospitalist during your hospital stay. If you have any questions about your discharge medications or the care you received while you were in the hospital after you are discharged, you can call the unit and asked to speak with the hospitalist on call if the hospitalist that took care of you is not available. Once you are discharged, your primary care physician will handle any further medical issues. Please note  that NO REFILLS for any discharge medications will be authorized once you are discharged, as it is imperative that you return to your primary care physician (or establish a relationship with a primary care physician if you do not have one) for your aftercare needs so that they can reassess your need for medications and monitor your lab values.     Today  Chief Complaint  Patient presents with  . Fall  . Emesis   Patient is doing fine. No new complaints  ROS:  CONSTITUTIONAL: Denies fevers, chills. Denies any fatigue, weakness.  EYES: Denies blurry vision, double vision, eye pain. EARS, NOSE, THROAT: Denies tinnitus, ear pain, hearing loss. RESPIRATORY: Denies cough, wheeze, shortness of breath.  CARDIOVASCULAR: Denies chest pain, palpitations, edema.  GASTROINTESTINAL: Denies nausea, vomiting, diarrhea, abdominal pain. Denies bright red blood per rectum. GENITOURINARY: Denies dysuria, hematuria. ENDOCRINE: Denies nocturia or thyroid problems. HEMATOLOGIC AND LYMPHATIC: Denies easy bruising or bleeding. SKIN: Denies rash or lesion. MUSCULOSKELETAL: Denies pain in neck, back, shoulder, knees, hips or arthritic symptoms.  NEUROLOGIC: Denies paralysis, paresthesias.  PSYCHIATRIC: Denies anxiety or depressive symptoms.   VITAL SIGNS:  Blood pressure (!) 158/76, pulse 67, temperature 97.7 F (36.5 C), temperature source Oral, resp. rate 16, height 5\' 9"  (  1.753 m), weight 88 kg (194 lb 1.6 oz), SpO2 95 %.  I/O:    Intake/Output Summary (Last 24 hours) at 05/03/17 1135 Last data filed at 05/03/17 1013  Gross per 24 hour  Intake             1360 ml  Output                0 ml  Net             1360 ml    PHYSICAL EXAMINATION:  GENERAL:  83 y.o.-year-old patient lying in the bed with no acute distress.  EYES: Pupils equal, round, reactive to light and accommodation. No scleral icterus. Extraocular muscles intact.  HEENT: Head atraumatic, normocephalic. Oropharynx and nasopharynx  clear.  NECK:  Supple, no jugular venous distention. No thyroid enlargement, no tenderness.  LUNGS: Normal breath sounds bilaterally, no wheezing, rales,rhonchi or crepitation. No use of accessory muscles of respiration.  CARDIOVASCULAR: S1, S2 normal. No murmurs, rubs, or gallops.  ABDOMEN: Soft, non-tender, non-distended. Bowel sounds present. No organomegaly or mass.  EXTREMITIES: No pedal edema, cyanosis, or clubbing.  NEUROLOGIC: Cranial nerves II through XII are intact. Muscle strength 5/5 in all extremities. Sensation intact. Gait not checked.  PSYCHIATRIC: The patient is alert and oriented x 3.  SKIN: No obvious rash, lesion, or ulcer.   DATA REVIEW:   CBC  Recent Labs Lab 05/03/17 0607  WBC 13.0*  HGB 14.0  HCT 42.4  PLT 191    Chemistries   Recent Labs Lab 04/30/17 1514  05/02/17 0022  NA 142  < > 139  K 3.7  < > 3.8  CL 106  < > 104  CO2 27  < > 28  GLUCOSE 143*  < > 148*  BUN 19  < > 21*  CREATININE 1.10  < > 1.24  CALCIUM 9.0  < > 8.0*  MG 1.7  --   --   AST 21  --   --   ALT 13*  --   --   ALKPHOS 38  --   --   BILITOT 0.9  --   --   < > = values in this interval not displayed.  Cardiac Enzymes  Recent Labs Lab 04/30/17 1514  TROPONINI <0.03    Microbiology Results  Results for orders placed or performed during the hospital encounter of 04/30/17  Urine culture     Status: Abnormal   Collection Time: 04/30/17  3:56 PM  Result Value Ref Range Status   Specimen Description URINE, CATHETERIZED  Final   Special Requests Normal  Final   Culture >=100,000 COLONIES/mL ESCHERICHIA COLI (A)  Final   Report Status 05/03/2017 FINAL  Final   Organism ID, Bacteria ESCHERICHIA COLI (A)  Final      Susceptibility   Escherichia coli - MIC*    AMPICILLIN >=32 RESISTANT Resistant     CEFAZOLIN >=64 RESISTANT Resistant     CEFTRIAXONE <=1 SENSITIVE Sensitive     CIPROFLOXACIN <=0.25 SENSITIVE Sensitive     GENTAMICIN <=1 SENSITIVE Sensitive     IMIPENEM  <=0.25 SENSITIVE Sensitive     NITROFURANTOIN <=16 SENSITIVE Sensitive     TRIMETH/SULFA <=20 SENSITIVE Sensitive     AMPICILLIN/SULBACTAM 16 INTERMEDIATE Intermediate     PIP/TAZO 8 SENSITIVE Sensitive     Extended ESBL NEGATIVE Sensitive     * >=100,000 COLONIES/mL ESCHERICHIA COLI    RADIOLOGY:  Dg Chest 1 View  Result Date: 05/01/2017 CLINICAL  DATA:  Pt with c/o wheezing and persistent cough; admitted yesterday after a fall; hx/o CKD and HTN; former smoker. EXAM: CHEST 1 VIEW COMPARISON:  One day prior FINDINGS: Numerous leads and wires project over the chest. Midline trachea. Mild cardiomegaly with transverse aortic atherosclerosis. No pleural effusion or pneumothorax. Clear lungs. No congestive failure. IMPRESSION: No acute findings. Cardiomegaly without congestive failure. Aortic atherosclerosis. Electronically Signed   By: Jeronimo Greaves M.D.   On: 05/01/2017 15:31   Dg Chest 2 View  Result Date: 04/30/2017 CLINICAL DATA:  Status post fall, with syncope. Concern for chest injury. Dry heaves and vomiting. Initial encounter. EXAM: CHEST  2 VIEW COMPARISON:  Chest radiograph performed 06/09/2012 FINDINGS: The lungs are well-aerated. Minimal left basilar scarring is noted. There is no evidence of focal opacification, pleural effusion or pneumothorax. The heart is normal in size; the mediastinal contour is within normal limits. No acute osseous abnormalities are seen. IMPRESSION: No acute cardiopulmonary process seen. No displaced rib fractures identified. Electronically Signed   By: Roanna Raider M.D.   On: 04/30/2017 17:11   Ct Head Wo Contrast  Result Date: 04/30/2017 CLINICAL DATA:  83 year old male with fall and head injury today. Initial encounter. EXAM: CT HEAD WITHOUT CONTRAST TECHNIQUE: Contiguous axial images were obtained from the base of the skull through the vertex without intravenous contrast. COMPARISON:  07/08/2010 head CT FINDINGS: Brain: No evidence of acute infarction,  hemorrhage, hydrocephalus, extra-axial collection or mass lesion/mass effect. Chronic small-vessel white matter ischemic changes again noted. Vascular: Intracranial atherosclerotic calcifications again noted. Skull: Normal. Negative for fracture or focal lesion. Sinuses/Orbits: No acute finding. Other: None. IMPRESSION: No evidence of acute intracranial abnormality. Chronic small-vessel white matter ischemic changes. Electronically Signed   By: Harmon Pier M.D.   On: 04/30/2017 17:03   Ct Renal Stone Study  Result Date: 04/30/2017 CLINICAL DATA:  Status post fall, with dry heaves and vomiting. Syncope. Generalized weakness. Initial encounter. EXAM: CT ABDOMEN AND PELVIS WITHOUT CONTRAST TECHNIQUE: Multidetector CT imaging of the abdomen and pelvis was performed following the standard protocol without IV contrast. COMPARISON:  CT of the abdomen and pelvis from 04/15/2016 FINDINGS: Lower chest: Diffuse coronary artery calcifications are seen. Calcification is noted at the mitral valve. Minimal bibasilar atelectasis is noted. Hepatobiliary: The liver is unremarkable in appearance. The gallbladder is unremarkable in appearance. The common bile duct remains normal in caliber. Pancreas: The pancreas is within normal limits. Spleen: The spleen is unremarkable in appearance. Adrenals/Urinary Tract: The adrenal glands are grossly unremarkable in appearance. Scattered bilateral renal cysts are seen, measuring up to 5.8 cm in size. Nonspecific perinephric stranding is noted bilaterally. Scattered nonobstructing bilateral renal stones are identified, measuring up to 4 mm in size. No obstructing ureteral stones are identified. There is no evidence of hydronephrosis. Stomach/Bowel: The stomach is unremarkable in appearance. The small bowel is within normal limits. The appendix is normal in caliber, without evidence of appendicitis. Scattered diverticulosis is noted along the entirety of the colon, most prominent along the  proximal to mid sigmoid colon, without evidence of diverticulitis. Vascular/Lymphatic: Diffuse calcification is seen along the abdominal aorta and its branches. No retroperitoneal or pelvic sidewall lymphadenopathy is seen. Reproductive: The bladder is mildly distended and grossly unremarkable. The prostate is enlarged, measuring 5.9 cm in transverse dimension, with scattered calcification. Other: No additional soft tissue abnormalities are seen. Musculoskeletal: No acute osseous abnormalities are identified. Facet disease is noted along the lumbar spine. Anterior and lateral bridging osteophytes are noted along  the lower thoracic and lumbar spine. The visualized musculature is unremarkable in appearance. IMPRESSION: 1. No evidence of traumatic injury to the abdomen or pelvis. 2. Scattered nonobstructing bilateral renal stones measure up to 4 mm in size. 3. Diffuse aortic atherosclerosis. 4. Scattered bilateral renal cysts. 5. Diverticulosis along the entirety of the colon, most prominent along the proximal to mid sigmoid colon, without evidence of diverticulitis. 6. Diffuse coronary artery calcifications seen. Calcification at the mitral valve. 7. Enlarged prostate noted. 8. Mild degenerative change along the lower thoracic and lumbar spine. Electronically Signed   By: Roanna Raider M.D.   On: 04/30/2017 18:23    EKG:   Orders placed or performed during the hospital encounter of 04/30/17  . EKG 12-Lead  . EKG 12-Lead  . ED EKG  . ED EKG      Management plans discussed with the patient, family and they are in agreement.  CODE STATUS:     Code Status Orders        Start     Ordered   04/30/17 2006  Full code  Continuous     04/30/17 2005    Code Status History    Date Active Date Inactive Code Status Order ID Comments User Context   This patient has a current code status but no historical code status.    Advance Directive Documentation     Most Recent Value  Type of Advance Directive   Healthcare Power of Attorney, Living will  Pre-existing out of facility DNR order (yellow form or pink MOST form)  -  "MOST" Form in Place?  -      TOTAL TIME TAKING CARE OF THIS PATIENT: 45  minutes.   Note: This dictation was prepared with Dragon dictation along with smaller phrase technology. Any transcriptional errors that result from this process are unintentional.   @[facility] @  on 05/03/2017 at 11:35 AM  Between 7am to 6pm - Pager - [phone]  After 6pm go to [email or web address] - password [email or web address] [facility]  [facility]  Office  [phone]  CC: Primary care physician; Malva Limes, MD

## 2017-05-04 ENCOUNTER — Telehealth: Payer: Self-pay

## 2017-05-04 NOTE — Telephone Encounter (Signed)
Transition Care Management Follow-up Telephone Call    Date discharged? 05/03/17  How have you been since you were released from the hospital? Recovering, still tired but denies weakness, dizziness, or urinary symptoms.  Any patient concerns? None   Items Reviewed:  Medications reviewed: Yes  Allergies reviewed: Yes  Dietary changes reviewed: N/A  Referrals reviewed: N/A   Functional Questionnaire:  Independent - I Dependent - D    Activities of Daily Living (ADLs):    Personal hygiene - I Dressing - I Eating - I Maintaining continence - Some trouble with urinary leakage Transferring - I   Independent Activities of Daily Living (iADLs): Basic communication skills - I Transportation - I Meal preparation - I Shopping - I Housework - I Managing medications - I  Managing personal finances - I   Confirmed importance and date/time of follow-up visits scheduled YES  Provider Appointment booked with PCP 05/11/17 @ 9:30 AM.  Confirmed with patient if condition begins to worsen call PCP or go to the ER.  Patient was given the office number and encouraged to call back with question or concerns: YES

## 2017-05-06 DIAGNOSIS — H34832 Tributary (branch) retinal vein occlusion, left eye, with macular edema: Secondary | ICD-10-CM | POA: Diagnosis not present

## 2017-05-11 ENCOUNTER — Encounter: Payer: Self-pay | Admitting: Family Medicine

## 2017-05-11 ENCOUNTER — Ambulatory Visit (INDEPENDENT_AMBULATORY_CARE_PROVIDER_SITE_OTHER): Payer: Medicare HMO | Admitting: Family Medicine

## 2017-05-11 VITALS — BP 120/58 | HR 75 | Temp 97.5°F | Resp 16 | Ht 69.0 in | Wt 194.0 lb

## 2017-05-11 DIAGNOSIS — R531 Weakness: Secondary | ICD-10-CM

## 2017-05-11 DIAGNOSIS — N390 Urinary tract infection, site not specified: Secondary | ICD-10-CM | POA: Diagnosis not present

## 2017-05-11 LAB — POCT URINALYSIS DIPSTICK
BILIRUBIN UA: NEGATIVE
Blood, UA: NEGATIVE
Glucose, UA: NEGATIVE
Leukocytes, UA: NEGATIVE
Nitrite, UA: NEGATIVE
PH UA: 5 (ref 5.0–8.0)
Urobilinogen, UA: 0.2 E.U./dL

## 2017-05-11 NOTE — Progress Notes (Signed)
Patient: Jose [name]. Male    DOB: 06-21-1928   83 y.o.   MRN: [id number] Visit Date: 05/11/2017  Today's Provider: Mila Merry, MD   Chief Complaint  Patient presents with  . Hospitalization Follow-up   Subjective:    HPI   Follow up Hospitalization  Patient was admitted to [facility] on 04/30/2017 and discharged on 05/03/2017. He was treated for Complicated UTI and Sepsis  . Treatment for this included; patient was treated with IV ceftriaxone. Patient was discharged home on ciprofloxacin.   Physical therapy is recommending home health  PT with 5 inch rolling walker. Patient has  outpatient physical therapy and he prefers  continuing outpatient physical therapy.  Prescription given for rolling walker.  Follow-up with primary care physician in a week  Continue outpatient physical therapy and  ambulate with rolling walker as recommended by  PT. Telephone follow up was done on yes/05/04/2017. He reports good compliance with treatment. He reports this condition is Improved.  He is still using walker to ambulate, but feels is nearly back to baseline.  ----------------------------------------------------------------      Allergies  Allergen Reactions  . Finasteride     Other reaction(s): Other (See Comments) PAIN IN BREAST     Current Outpatient Prescriptions:  .  acetaminophen (TYLENOL) 500 MG tablet, Take 500 mg by mouth every 8 (eight) hours as needed., Disp: , Rfl:  .  bisacodyl (DULCOLAX) 10 MG suppository, Place 1 suppository (10 mg total) rectally daily as needed for moderate constipation., Disp: 12 suppository, Rfl: 0 .  bisoprolol-hydrochlorothiazide (ZIAC) 5-6.25 MG tablet, take 1 tablet by mouth once daily, Disp: 30 tablet, Rfl: 8 .  ciprofloxacin (CIPRO) 500 MG tablet, Take 1 tablet (500 mg total) by mouth 2 (two) times daily., Disp: 14 tablet, Rfl: 0 .  docusate sodium (COLACE) 100 MG capsule, Take 1 capsule (100 mg total) by mouth daily., Disp: 10 capsule,  Rfl: 0 .  simvastatin (ZOCOR) 40 MG tablet, Take 1 tablet by mouth daily., Disp: , Rfl: 0 .  tamsulosin (FLOMAX) 0.4 MG CAPS capsule, Take 1 capsule (0.4 mg total) by mouth daily., Disp: 30 capsule, Rfl: 0  Review of Systems  Constitutional: Negative for appetite change, chills and fever.  Respiratory: Negative for chest tightness, shortness of breath and wheezing.   Cardiovascular: Negative for chest pain and palpitations.  Gastrointestinal: Negative for abdominal pain, nausea and vomiting.    Social History  Substance Use Topics  . Smoking status: Former Smoker    Packs/day: 3.00    Years: 25.00    Types: Cigarettes    Quit date: 12/27/1978  . Smokeless tobacco: Never Used  . Alcohol use No   Objective:   BP (!) 120/58 (BP Location: Right Arm, Patient Position: Sitting, Cuff Size: Large)   Pulse 75   Temp 97.5 F (36.4 C) (Oral)   Resp 16   Ht 5\' 9"  (1.753 m)   Wt 194 lb (88 kg)   SpO2 91%   BMI 28.65 kg/m  Vitals:   05/11/17 0932  BP: (!) 120/58  Pulse: 75  Resp: 16  Temp: 97.5 F (36.4 C)  TempSrc: Oral  SpO2: 91%  Weight: 194 lb (88 kg)  Height: 5\' 9"  (1.753 m)     Physical Exam   General Appearance:    Alert, cooperative, no distress  Eyes:    PERRL, conjunctiva/corneas clear, EOM's intact       Lungs:     Clear  to auscultation bilaterally, respirations unlabored  Heart:    Regular rate and rhythm  Neurologic:   Awake, alert, oriented x 3. No apparent focal neurological           defect.       Results for orders placed or performed in visit on 05/11/17  POCT urinalysis dipstick  Result Value Ref Range   Color, UA Yellow    Clarity, UA Cloudy    Glucose, UA Neg    Bilirubin, UA Neg    Ketones, UA Trace    Spec Grav, UA >=1.030 (A) 1.010 - 1.025   Blood, UA Neg    pH, UA 5.0 5.0 - 8.0   Protein, UA 30+    Urobilinogen, UA 0.2 0.2 or 1.0 E.U./dL   Nitrite, UA Neg    Leukocytes, UA Negative Negative        Assessment & Plan:     1. Urinary  tract infection without hematuria, site unspecified Resolved. Finish antibiotic as prescribed.  - POCT urinalysis dipstick  2. Weakness Secondary to urosepsis which has resolved. Continue PT until back to baseline.        Mila Merryonald Fisher, MD  [facility]

## 2017-06-21 DIAGNOSIS — H34832 Tributary (branch) retinal vein occlusion, left eye, with macular edema: Secondary | ICD-10-CM | POA: Diagnosis not present

## 2017-07-25 ENCOUNTER — Emergency Department: Payer: MEDICARE

## 2017-07-25 ENCOUNTER — Inpatient Hospital Stay
Admission: EM | Admit: 2017-07-25 | Discharge: 2017-07-26 | DRG: 696 | Disposition: A | Discharge status: Home / Self Care | Payer: MEDICARE | Attending: Internal Medicine | Admitting: Internal Medicine

## 2017-07-25 DIAGNOSIS — Z841 Family history of disorders of kidney and ureter: Secondary | ICD-10-CM | POA: Diagnosis not present

## 2017-07-25 DIAGNOSIS — Z9889 Other specified postprocedural states: Secondary | ICD-10-CM | POA: Diagnosis not present

## 2017-07-25 DIAGNOSIS — Z888 Allergy status to other drugs, medicaments and biological substances status: Secondary | ICD-10-CM

## 2017-07-25 DIAGNOSIS — N400 Enlarged prostate without lower urinary tract symptoms: Secondary | ICD-10-CM | POA: Diagnosis not present

## 2017-07-25 DIAGNOSIS — R310 Gross hematuria: Principal | ICD-10-CM | POA: Diagnosis present

## 2017-07-25 DIAGNOSIS — Z8349 Family history of other endocrine, nutritional and metabolic diseases: Secondary | ICD-10-CM

## 2017-07-25 DIAGNOSIS — N281 Cyst of kidney, acquired: Secondary | ICD-10-CM | POA: Diagnosis present

## 2017-07-25 DIAGNOSIS — Z955 Presence of coronary angioplasty implant and graft: Secondary | ICD-10-CM | POA: Diagnosis not present

## 2017-07-25 DIAGNOSIS — Z801 Family history of malignant neoplasm of trachea, bronchus and lung: Secondary | ICD-10-CM | POA: Diagnosis not present

## 2017-07-25 DIAGNOSIS — Z9842 Cataract extraction status, left eye: Secondary | ICD-10-CM | POA: Diagnosis not present

## 2017-07-25 DIAGNOSIS — Z87442 Personal history of urinary calculi: Secondary | ICD-10-CM

## 2017-07-25 DIAGNOSIS — G4730 Sleep apnea, unspecified: Secondary | ICD-10-CM | POA: Diagnosis present

## 2017-07-25 DIAGNOSIS — I10 Essential (primary) hypertension: Secondary | ICD-10-CM | POA: Diagnosis not present

## 2017-07-25 DIAGNOSIS — E785 Hyperlipidemia, unspecified: Secondary | ICD-10-CM | POA: Diagnosis present

## 2017-07-25 DIAGNOSIS — Z87891 Personal history of nicotine dependence: Secondary | ICD-10-CM

## 2017-07-25 DIAGNOSIS — Z9841 Cataract extraction status, right eye: Secondary | ICD-10-CM

## 2017-07-25 DIAGNOSIS — Z833 Family history of diabetes mellitus: Secondary | ICD-10-CM

## 2017-07-25 DIAGNOSIS — Z803 Family history of malignant neoplasm of breast: Secondary | ICD-10-CM

## 2017-07-25 DIAGNOSIS — I129 Hypertensive chronic kidney disease with stage 1 through stage 4 chronic kidney disease, or unspecified chronic kidney disease: Secondary | ICD-10-CM | POA: Diagnosis present

## 2017-07-25 DIAGNOSIS — Z8249 Family history of ischemic heart disease and other diseases of the circulatory system: Secondary | ICD-10-CM | POA: Diagnosis not present

## 2017-07-25 DIAGNOSIS — Z823 Family history of stroke: Secondary | ICD-10-CM

## 2017-07-25 DIAGNOSIS — R319 Hematuria, unspecified: Secondary | ICD-10-CM | POA: Diagnosis present

## 2017-07-25 DIAGNOSIS — Z79899 Other long term (current) drug therapy: Secondary | ICD-10-CM

## 2017-07-25 DIAGNOSIS — G8929 Other chronic pain: Secondary | ICD-10-CM | POA: Diagnosis present

## 2017-07-25 DIAGNOSIS — N189 Chronic kidney disease, unspecified: Secondary | ICD-10-CM | POA: Diagnosis not present

## 2017-07-25 DIAGNOSIS — S3121XA Laceration without foreign body of penis, initial encounter: Secondary | ICD-10-CM | POA: Diagnosis not present

## 2017-07-25 DIAGNOSIS — N200 Calculus of kidney: Secondary | ICD-10-CM | POA: Diagnosis present

## 2017-07-25 LAB — BASIC METABOLIC PANEL
ANION GAP: 6 (ref 5–15)
BUN: 25 mg/dL — ABNORMAL HIGH (ref 6–20)
CHLORIDE: 105 mmol/L (ref 101–111)
CO2: 31 mmol/L (ref 22–32)
Calcium: 9.1 mg/dL (ref 8.9–10.3)
Creatinine, Ser: 1.26 mg/dL — ABNORMAL HIGH (ref 0.61–1.24)
GFR calc non Af Amer: 49 mL/min — ABNORMAL LOW (ref >60)
GFR, EST AFRICAN AMERICAN: 57 mL/min — AB (ref >60)
Glucose, Bld: 127 mg/dL — ABNORMAL HIGH (ref 65–99)
Potassium: 3.9 mmol/L (ref 3.5–5.1)
SODIUM: 142 mmol/L (ref 135–145)

## 2017-07-25 LAB — CBC
HCT: 44 % (ref 40.0–52.0)
HEMOGLOBIN: 14.7 g/dL (ref 13.0–18.0)
MCH: 29.8 pg (ref 26.0–34.0)
MCHC: 33.4 g/dL (ref 32.0–36.0)
MCV: 89 fL (ref 80.0–100.0)
PLATELETS: 227 10*3/uL (ref 150–440)
RBC: 4.94 MIL/uL (ref 4.40–5.90)
RDW: 14.7 % — AB (ref 11.5–14.5)
WBC: 9.8 10*3/uL (ref 3.8–10.6)

## 2017-07-25 LAB — URINALYSIS, COMPLETE (UACMP) WITH MICROSCOPIC
Bacteria, UA: NONE SEEN
SPECIFIC GRAVITY, URINE: 1.02 (ref 1.005–1.030)
SQUAMOUS EPITHELIAL / LPF: NONE SEEN

## 2017-07-25 LAB — HEMOGLOBIN
Hemoglobin: 13.9 g/dL (ref 13.0–18.0)
Hemoglobin: 14 g/dL (ref 13.0–18.0)

## 2017-07-25 MED ORDER — ONDANSETRON HCL 4 MG PO TABS: 4.0000 mg | ORAL_TABLET | Freq: Four times a day (QID) | ORAL | Status: DC | PRN

## 2017-07-25 MED ORDER — ACETAMINOPHEN 650 MG RE SUPP: 650.0000 mg | Freq: Four times a day (QID) | RECTAL | Status: DC | PRN

## 2017-07-25 MED ORDER — ONDANSETRON HCL 4 MG/2ML IJ SOLN: 4.0000 mg | Freq: Four times a day (QID) | INTRAMUSCULAR | Status: DC | PRN

## 2017-07-25 MED ORDER — HYDRALAZINE HCL 20 MG/ML IJ SOLN
10.0000 mg | Freq: Four times a day (QID) | INTRAMUSCULAR | Status: DC | PRN
  Administered 2017-07-25: 10 mg via INTRAVENOUS
  Filled 2017-07-25: qty 1

## 2017-07-25 MED ORDER — SIMVASTATIN 40 MG PO TABS
40.0000 mg | ORAL_TABLET | Freq: Every day | ORAL | Status: DC
  Administered 2017-07-25 – 2017-07-26 (×2): 40 mg via ORAL
  Filled 2017-07-25 (×2): qty 1

## 2017-07-25 MED ORDER — FINASTERIDE 5 MG PO TABS
5.0000 mg | ORAL_TABLET | Freq: Every day | ORAL | Status: DC
  Administered 2017-07-25 – 2017-07-26 (×2): 5 mg via ORAL
  Filled 2017-07-25 (×2): qty 1

## 2017-07-25 MED ORDER — SODIUM CHLORIDE 0.9 % IV SOLN
INTRAVENOUS | Status: DC
Start: 2017-07-25 — End: 2017-07-26
  Administered 2017-07-25 – 2017-07-26 (×2): via INTRAVENOUS

## 2017-07-25 MED ORDER — ACETAMINOPHEN 325 MG PO TABS: 650.0000 mg | ORAL_TABLET | Freq: Four times a day (QID) | ORAL | Status: DC | PRN

## 2017-07-25 MED ORDER — BISOPROLOL-HYDROCHLOROTHIAZIDE 5-6.25 MG PO TABS
1.0000 | ORAL_TABLET | Freq: Every day | ORAL | Status: DC
  Administered 2017-07-25 – 2017-07-26 (×2): 1 via ORAL
  Filled 2017-07-25 (×2): qty 1

## 2017-07-25 MED ORDER — SODIUM CHLORIDE 0.9 % IV BOLUS (SEPSIS)
1000.0000 mL | Freq: Once | INTRAVENOUS | Status: AC
  Administered 2017-07-25: 1000 mL via INTRAVENOUS

## 2017-07-25 NOTE — ED Provider Notes (Addendum)
[facility] Emergency Department Provider Note   ____________________________________________   First MD Initiated Contact with Patient 07/25/17 [phone]     (approximate)  I have reviewed the triage vital signs and the nursing notes.   HISTORY  Chief Complaint Hematuria    HPI Jose HuhWilliam E Tomczak Jr. is a 83 y.o. male who comes into the hospital today with hematuria. He reports that he saw a urologist 8 months ago for kidney stones and he has had some back pain. The patient states that the bleeding started Sunday morning. EMS states that they noticed some dried blood in patient's underwear. He states that he always has pain in his back and it's about a 3 out of 10 in intensity. He reports that he is able to urinate but he's having chunks of blood in his urine that he has never had before. He has some mild pain with urination. The patient is not on blood thinners. He decided to come into the hospital today for evaluation. He has some mild lower abdominal discomfort no fevers no other complaints.   Past Medical History:  Diagnosis Date  . Allergy   . BPH (benign prostatic hyperplasia)   . Chronic kidney disease   . DDD (degenerative disc disease), lumbar   . Depression   . Diverticulosis   . Elevated PSA   . Gross hematuria   . Gynecomastia   . Headache   . History of kidney stones   . HTN (hypertension)   . Incomplete bladder emptying   . Nodular prostate with urinary obstruction   . Organic impotence   . OSA (obstructive sleep apnea)   . Renal colic   . Rosacea   . Sciatica   . Urinary frequency     Patient Active Problem List   Diagnosis Date Noted  . Sepsis (HCC) 04/30/2017  . Hydronephrosis with urinary obstruction due to ureteral calculus 05/16/2016  . Kidney stones 04/14/2016  . Flank pain 04/14/2016  . BPH with obstruction/lower urinary tract symptoms 04/14/2016  . Erectile dysfunction of organic origin 04/14/2016  . Arthritis,  degenerative 02/20/2016  . LV dysfunction 02/20/2016  . Allergic rhinitis 02/13/2016  . COPD (chronic obstructive pulmonary disease) (HCC) 02/13/2016  . Nephrolithiasis 02/13/2016  . GERD (gastroesophageal reflux disease) 02/13/2016  . Diverticulosis of colon 02/13/2016  . Unspecified visual disturbance 02/13/2016  . Constipation 02/13/2016  . Chronic kidney disease (CKD), stage III (moderate) 02/13/2016  . Memory loss 02/13/2016  . Chronic headache disorder 02/13/2016  . Pre-diabetes 02/13/2016  . BPH (benign prostatic hyperplasia) 02/13/2016  . Urinary incontinence without sensory awareness 11/22/2012  . Nodular prostate 11/22/2012  . Incomplete bladder emptying 11/22/2012  . Elevated prostate specific antigen (PSA) 11/22/2012  . Calculus of kidney 11/22/2012  . Obstructive sleep apnea 10/06/2011  . Lumbar disc disease 01/28/2010  . Spondylosis 01/28/2010  . HLD (hyperlipidemia) 05/25/2006  . Essential (primary) hypertension 05/25/2006  . CAD (coronary artery disease) 05/21/2000    Past Surgical History:  Procedure Laterality Date  . ANGIOPLASTY  2001   PTCA and stenting of LAD by Dr. Juliann Paresallwood  . CATARACT EXTRACTION Bilateral 07/2006  . CT Scan of head  12/01/2004   Normal  . DOPPLER ECHOCARDIOGRAPHY  03/19/2013   Normal; Moderate global LV dysfunction. EF=45%. Mild Aortic insufficiency  . Double Ureter stent placement  08/1999   Double J Ureter stent placement  . EXTRACORPOREAL SHOCK WAVE LITHOTRIPSY Right 04/29/2016   Procedure: EXTRACORPOREAL SHOCK WAVE LITHOTRIPSY (ESWL);  Surgeon: Morrie SheldonAshley  Apolinar Junes, MD;  Location: [facility] ORS;  Service: Urology;  Laterality: Right;  . MRI CERVICAL SPINE WO CONTRAST ([facility] HX)  01/28/2010   Abnormal Results; Arhritis and bulging discs. Referral to Neurosurgery  . MRI CERVICAL SPINE WO CONTRAST ([facility] HX)  10/19/2005   Abnormal, Bone spurs  . Sleep study  10/06/2011   Severe sleep apnea. AHI= 35.4 per hr. Done at [facility]  .  SPIROMETRY  09/11/2007   Moderately Severe obstruction  . TRANSURETHRAL RESECTION OF PROSTATE      Prior to Admission medications   Medication Sig Start Date End Date Taking? Authorizing Provider  acetaminophen (TYLENOL) 500 MG tablet Take 500 mg by mouth every 8 (eight) hours as needed.   Yes [provider]  bisoprolol-hydrochlorothiazide [facility]) 5-6.25 MG tablet take 1 tablet by mouth once daily 09/17/16  Yes Fisher, Demetrios Isaacs, MD  simvastatin (ZOCOR) 40 MG tablet Take 1 tablet by mouth daily. 01/21/16  Yes [provider]  bisacodyl (DULCOLAX) 10 MG suppository Place 1 suppository (10 mg total) rectally daily as needed for moderate constipation. Patient not taking: Reported on 07/25/2017 05/03/17   Ramonita Lab, MD  docusate sodium (COLACE) 100 MG capsule Take 1 capsule (100 mg total) by mouth daily. Patient not taking: Reported on 07/25/2017 05/03/17   Ramonita Lab, MD  tamsulosin (FLOMAX) 0.4 MG CAPS capsule Take 1 capsule (0.4 mg total) by mouth daily. Patient not taking: Reported on 07/25/2017 04/29/16   Vanna Scotland, MD    Allergies Finasteride  Family History  Problem Relation Age of Onset  . Heart disease Father   . Diabetes Father        type 2  . Hyperlipidemia Father   . Stroke Father   . Kidney disease Father   . Hypertension Other   . Breast cancer Other   . Lung cancer Other   . Alcohol abuse Paternal Uncle   . Cancer Sister   . Cancer Son   . Prostate cancer Neg Hx     Social History Social History  Substance Use Topics  . Smoking status: Former Smoker    Packs/day: 3.00    Years: 25.00    Types: Cigarettes    Quit date: 12/27/1978  . Smokeless tobacco: Never Used  . Alcohol use No    Review of Systems  Constitutional: No fever/chills Eyes: No visual changes. ENT: No sore throat. Cardiovascular: Denies chest pain. Respiratory: Denies shortness of breath. Gastrointestinal: No abdominal pain.  No nausea, no vomiting.  No diarrhea.  No  constipation. Genitourinary: Hematuria and dysuria Musculoskeletal: back pain. Skin: Negative for rash. Neurological: Negative for headaches, focal weakness or numbness.   ____________________________________________   PHYSICAL EXAM:  VITAL SIGNS: ED Triage Vitals  Enc Vitals Group     BP 07/25/17 0420 (!) 203/68     Pulse Rate 07/25/17 0420 61     Resp 07/25/17 0708 18     Temp 07/25/17 0418 97.6 F (36.4 C)     Temp Source 07/25/17 0418 Oral     SpO2 07/25/17 0420 96 %     Weight 07/25/17 0421 190 lb (86.2 kg)     Height 07/25/17 0421 5\' 9"  (1.753 m)     Head Circumference --      Peak Flow --      Pain Score 07/25/17 0708 0     Pain Loc --      Pain Edu? --      Excl. in GC? --  Constitutional: Alert and oriented. Well appearing and in Mild distress. Eyes: Conjunctivae are normal. PERRL. EOMI. Head: Atraumatic. Nose: No congestion/rhinnorhea. Mouth/Throat: Mucous membranes are moist.  Oropharynx non-erythematous. Cardiovascular: Normal rate, regular rhythm. Grossly normal heart sounds.  Good peripheral circulation. Respiratory: Normal respiratory effort.  No retractions. Lungs CTAB. Gastrointestinal: Soft and nontender. No distention. Positive bowel sounds Genitourinary: Blood on the patient's foreskin but no injury to the meatus or blood on the meatus. Musculoskeletal: No lower extremity tenderness nor edema.   Neurologic:  Normal speech and language.  Skin:  Skin is warm, dry and intact.  Psychiatric: Mood and affect are normal.   ____________________________________________   LABS (all labs ordered are listed, but only abnormal results are displayed)  Labs Reviewed  URINALYSIS, COMPLETE (UACMP) WITH MICROSCOPIC - Abnormal; Notable for the following:       Result Value   Color, Urine RED (*)    APPearance CLOUDY (*)    Glucose, UA   (*)    Value: TEST NOT REPORTED DUE TO COLOR INTERFERENCE OF URINE PIGMENT   Hgb urine dipstick   (*)    Value: TEST  NOT REPORTED DUE TO COLOR INTERFERENCE OF URINE PIGMENT   Bilirubin Urine   (*)    Value: TEST NOT REPORTED DUE TO COLOR INTERFERENCE OF URINE PIGMENT   Ketones, ur   (*)    Value: TEST NOT REPORTED DUE TO COLOR INTERFERENCE OF URINE PIGMENT   Protein, ur   (*)    Value: TEST NOT REPORTED DUE TO COLOR INTERFERENCE OF URINE PIGMENT   Nitrite   (*)    Value: TEST NOT REPORTED DUE TO COLOR INTERFERENCE OF URINE PIGMENT   Leukocytes, UA   (*)    Value: TEST NOT REPORTED DUE TO COLOR INTERFERENCE OF URINE PIGMENT   All other components within normal limits  BASIC METABOLIC PANEL - Abnormal; Notable for the following:    Glucose, Bld 127 (*)    BUN 25 (*)    Creatinine, Ser 1.26 (*)    GFR calc non Af Amer 49 (*)    GFR calc Af Amer 57 (*)    All other components within normal limits  CBC - Abnormal; Notable for the following:    RDW 14.7 (*)    All other components within normal limits   ____________________________________________  EKG  none ____________________________________________  RADIOLOGY  Ct Renal Stone Study  Result Date: 07/25/2017 CLINICAL DATA:  Hematuria and left flank region pain.  Hypertension. EXAM: CT ABDOMEN AND PELVIS WITHOUT CONTRAST TECHNIQUE: Multidetector CT imaging of the abdomen and pelvis was performed following the standard protocol without oral or intravenous contrast material administration. COMPARISON:  Apr 30, 2017 FINDINGS: Lower chest: No lung base edema or consolidation. There are multiple foci of coronary artery calcification as well as calcification in the mitral annulus. Hepatobiliary: No focal liver lesions are appreciable on this noncontrast enhanced study. Gallbladder wall is not appreciably thickened. There is no biliary duct dilatation. Pancreas: There is no pancreatic mass or inflammatory focus. Spleen: No splenic lesions are evident. Adrenals/Urinary Tract: Adrenals appear unremarkable bilaterally. 3.5 x 3.0 cm. There is a cyst arising from  the posterior upper left kidney measuring 1.4 x 1.4 cm. There is a cyst arising from the lower pole of the left kidney measuring 6.0 x 5.7 cm. There is no evident hydronephrosis on either side. There is a calculus in the mid left kidney measuring 4 x 3 mm. More inferiorly on the left, there is  a calculus measuring 7 x 6 mm. There is a 1 mm calculus more inferiorly in the lower pole left kidney. There is no appreciable ureteral calculus on either side. Urinary bladder wall is not appreciably thickened. L lung There is increased attenuation material in the urinary bladder, not present on recent prior study, likely recent hemorrhage. Focal area of hematoma in the inferior urinary bladder measures 6 6.4 x 4.4 cm. Several smaller areas of irregular increased attenuation also felt to be indicative of recent hemorrhage. Stomach/Bowel: There are scattered colonic diverticula throughout the colon without evidence of diverticulitis. No bowel wall or mesenteric thickening. No bowel obstruction. No free air or portal venous air. Vascular/Lymphatic: There is atherosclerotic calcification in the aorta and major mesenteric arterial vessels. No appreciable abdominal aortic aneurysm. There is also atherosclerotic calcification throughout the iliac and visualized femoral arteries. No adenopathy is appreciable in the abdomen or pelvis. Reproductive: Prostate is mildly enlarged but stable. There are multiple prostatic calculi. Seminal vesicles appear normal. No well-defined pelvic mass evident. Other: Appendix appears normal. No abscess or ascites evident in the abdomen or pelvis. There is fat in each inguinal ring. Musculoskeletal: There is degenerative change in the lower thoracic and lumbar spine regions. There is no blastic or lytic bone lesion. IMPRESSION: 1. Increased attenuation material in the urinary bladder with a somewhat irregular contour. Suspect recent hemorrhage. A degree of active hemorrhage within the urinary bladder  cannot be excluded. Urologic assessment advised. Etiology for hemorrhage within the urinary bladder uncertain. No underlying mass seen. An underlying urinary bladder mass cannot be entirely excluded, although no lesion of this nature with seen on recent prior CT. 2. Nonobstructing calculi left kidney. No hydronephrosis or ureteral calculus on either side. 3. Scattered colonic diverticula without diverticulitis. No bowel obstruction. No abscess. Appendix appears normal. 4. Extensive aortic and bilateral iliac and femoral atherosclerotic calcification. Multiple foci of calcification in proximal mesenteric arteries without major vessel obstruction. There are foci of coronary artery calcification. No aneurysm evident. 5.  Prominent prostate with prostatic calculi present, stable. Aortic Atherosclerosis (ICD10-I70.0). Electronically Signed   By: Bretta BangWilliam  Woodruff III M.D.   On: 07/25/2017 08:28    ____________________________________________   PROCEDURES  Procedure(s) performed: None  Procedures  Critical Care performed: No  ____________________________________________   INITIAL IMPRESSION / ASSESSMENT AND PLAN / ED COURSE  Pertinent labs & imaging results that were available during my care of the patient were reviewed by me and considered in my medical decision making (see chart for details).  This is an 83 year old male who comes into the hospital today with some hematuria. The patient has been having it for the past day but came into the hospital to get evaluated. The patient is in no significant discomfort at this time. We did check some blood work and urine but he does have too numerous to count white blood cells and red blood cells in his urine. I will give the patient a liter of normal saline to help clear his urine. I will also send the patient for a CT renal stone study to evaluate for stones or other causes of his hematuria.     We contacted the urologist about the patient. He states  that there is a large clot inside of the patient's bladder with some mild distention. He recommends placing a catheter and irrigating the patient's bladder. He also recommends admission and an inpatient consult for hematuria. The patient will be admitted to the hospitalist service for hematuria. ____________________________________________  FINAL CLINICAL IMPRESSION(S) / ED DIAGNOSES  Final diagnoses:  Gross hematuria      NEW MEDICATIONS STARTED DURING THIS VISIT:  New Prescriptions   No medications on file     Note:  This document was prepared using Dragon voice recognition software and may include unintentional dictation errors.    Rebecka Apley, MD 07/25/17 0831    Rebecka Apley, MD 07/25/17 [phone]

## 2017-07-25 NOTE — ED Notes (Signed)
Report to floor, to room 221

## 2017-07-25 NOTE — Consult Note (Signed)
Urology Consult  I have been asked to see the patient by Dr. Nemiah Commander, for evaluation and management of gross hematuria.  Chief Complaint: Bloody urine  History of Present Illness: Jose [name]. is a 83 y.o. year old with a personal history of BPH and kidney stones who presented to the emergency room this morning with difficulty voiding, passing bloody urine with clots. He reports that this started yesterday after having a bowel movement. The clots and blood lasted for to void and cleared up. It started again overnight and early this morning requiring an ambulance ride to the emergency room.  Further workup in the ER included CT renal stone protocol due to presence of hematuria and complaints of pain in the left flank which is chronic.  Stable nonobstructing calculi were patient within the kidneys but no obstructing stones. Within the bladder, there was an area of hemorrhage/clot occupying 6.4 x 4.4 cm. He was also noted to have an enlarged prostate.  UA showed too numerous to count white blood cells or red blood cells but no bacteria. No leukocytosis, fevers or chills.  Upon arrival to the urology floor, nursing staff was able to place a 22 [place] two-way Foley catheter and hand irrigate a large amount of clot until the patient's bladder was cleared.  He does have a known history of BPH. He was previously on Flomax and finasteride. He is not taking you. These medications at this time. He reports breast tenderness secondary to finasteride.  The sling urinary symptoms include skin stream and prolonged voiding. He gets up once at night to void. He does have some mild frequency and urgency but overall minimal bother.  Most recent PSA on 1.9 on 09/14/2016.  Most recent rectal exam 3/18 unremarkable, markedly large 50+ cc without nodules.  Past Medical History:  Diagnosis Date  . Allergy   . BPH (benign prostatic hyperplasia)   . Chronic kidney disease   . DDD (degenerative disc  disease), lumbar   . Depression   . Diverticulosis   . Elevated PSA   . Gross hematuria   . Gynecomastia   . Headache   . History of kidney stones   . HTN (hypertension)   . Incomplete bladder emptying   . Nodular prostate with urinary obstruction   . Organic impotence   . OSA (obstructive sleep apnea)   . Renal colic   . Rosacea   . Sciatica   . Urinary frequency     Past Surgical History:  Procedure Laterality Date  . ANGIOPLASTY  2001   PTCA and stenting of LAD by Dr. Juliann Pares  . CATARACT EXTRACTION Bilateral 07/2006  . CT Scan of head  12/01/2004   Normal  . DOPPLER ECHOCARDIOGRAPHY  03/19/2013   Normal; Moderate global LV dysfunction. EF=45%. Mild Aortic insufficiency  . Double Ureter stent placement  08/1999   Double J Ureter stent placement  . EXTRACORPOREAL SHOCK WAVE LITHOTRIPSY Right 04/29/2016   Procedure: EXTRACORPOREAL SHOCK WAVE LITHOTRIPSY (ESWL);  Surgeon: Vanna Scotland, MD;  Location: [facility] ORS;  Service: Urology;  Laterality: Right;  . MRI CERVICAL SPINE WO CONTRAST ([facility] HX)  01/28/2010   Abnormal Results; Arhritis and bulging discs. Referral to Neurosurgery  . MRI CERVICAL SPINE WO CONTRAST ([facility] HX)  10/19/2005   Abnormal, Bone spurs  . Sleep study  10/06/2011   Severe sleep apnea. AHI= 35.4 per hr. Done at [facility]  . SPIROMETRY  09/11/2007   Moderately Severe obstruction  . TRANSURETHRAL  RESECTION OF PROSTATE      Home Medications:  Current Meds  Medication Sig  . acetaminophen (TYLENOL) 500 MG tablet Take 500 mg by mouth every 8 (eight) hours as needed.  . bisoprolol-hydrochlorothiazide (ZIAC) 5-6.25 MG tablet take 1 tablet by mouth once daily  . simvastatin (ZOCOR) 40 MG tablet Take 1 tablet by mouth daily.    Allergies:  Allergies  Allergen Reactions  . Finasteride     Other reaction(s): Other (See Comments) PAIN IN BREAST    Family History  Problem Relation Age of Onset  . Heart disease Father   . Diabetes Father         type 2  . Hyperlipidemia Father   . Stroke Father   . Kidney disease Father   . Hypertension Other   . Breast cancer Other   . Lung cancer Other   . Alcohol abuse Paternal Uncle   . Cancer Sister   . Cancer Son   . Prostate cancer Neg Hx     Social History:  reports that he quit smoking about 38 years ago. His smoking use included Cigarettes. He has a 75.00 pack-year smoking history. He has never used smokeless tobacco. He reports that he does not drink alcohol or use drugs.  ROS: A complete review of systems was performed.  All systems are negative except for pertinent findings as noted.  Physical Exam:  Vital signs in last 24 hours: Temp:  [97.4 F (36.3 C)-97.6 F (36.4 C)] 97.4 F (36.3 C) (07/30 1334) Pulse Rate:  [60-66] 65 (07/30 1334) Resp:  [17-20] 18 (07/30 1334) BP: (144-203)/(29-100) 163/29 (07/30 1334) SpO2:  [95 %-99 %] 99 % (07/30 1334) Weight:  [190 lb (86.2 kg)] 190 lb (86.2 kg) (07/30 0421) Constitutional:  Alert and oriented, No acute distress HEENT: [place] AT, moist mucus membranes.  Trachea midline, no masses Cardiovascular: Regular rate and rhythm, no clubbing, cyanosis, or edema. Respiratory: Normal respiratory effort, lungs clear bilaterally GI: Abdomen is soft, nontender, nondistended, no abdominal masses GU: Foley catheter in place draining pink tinged urine. This was hand irrigated at bedside with return of only 1 very small clot.  Circumcised phallus with orthotopic meatus. Bilateral descended testicles. Skin: No rashes, bruises or suspicious lesions Neurologic: Grossly intact, no focal deficits, moving all 4 extremities Psychiatric: Normal mood and affect  Laboratory Data:   Recent Labs  07/25/17 0420 07/25/17 1121  WBC 9.8  --   HGB 14.7 13.9  HCT 44.0  --     Recent Labs  07/25/17 0420  NA 142  K 3.9  CL 105  CO2 31  GLUCOSE 127*  BUN 25*  CREATININE 1.26*  CALCIUM 9.1   No results for input(s): LABPT, INR in the last 72  hours. No results for input(s): LABURIN in the last 72 hours. Results for orders placed or performed during the hospital encounter of 04/30/17  Urine culture     Status: Abnormal   Collection Time: 04/30/17  3:56 PM  Result Value Ref Range Status   Specimen Description URINE, CATHETERIZED  Final   Special Requests Normal  Final   Culture >=100,000 COLONIES/mL ESCHERICHIA COLI (A)  Final   Report Status 05/03/2017 FINAL  Final   Organism ID, Bacteria ESCHERICHIA COLI (A)  Final      Susceptibility   Escherichia coli - MIC*    AMPICILLIN >=32 RESISTANT Resistant     CEFAZOLIN >=64 RESISTANT Resistant     CEFTRIAXONE <=1 SENSITIVE Sensitive  CIPROFLOXACIN <=0.25 SENSITIVE Sensitive     GENTAMICIN <=1 SENSITIVE Sensitive     IMIPENEM <=0.25 SENSITIVE Sensitive     NITROFURANTOIN <=16 SENSITIVE Sensitive     TRIMETH/SULFA <=20 SENSITIVE Sensitive     AMPICILLIN/SULBACTAM 16 INTERMEDIATE Intermediate     PIP/TAZO 8 SENSITIVE Sensitive     Extended ESBL NEGATIVE Sensitive     * >=100,000 COLONIES/mL ESCHERICHIA COLI     Radiologic Imaging: Ct Renal Stone Study  Result Date: 07/25/2017 CLINICAL DATA:  Hematuria and left flank region pain.  Hypertension. EXAM: CT ABDOMEN AND PELVIS WITHOUT CONTRAST TECHNIQUE: Multidetector CT imaging of the abdomen and pelvis was performed following the standard protocol without oral or intravenous contrast material administration. COMPARISON:  Apr 30, 2017 FINDINGS: Lower chest: No lung base edema or consolidation. There are multiple foci of coronary artery calcification as well as calcification in the mitral annulus. Hepatobiliary: No focal liver lesions are appreciable on this noncontrast enhanced study. Gallbladder wall is not appreciably thickened. There is no biliary duct dilatation. Pancreas: There is no pancreatic mass or inflammatory focus. Spleen: No splenic lesions are evident. Adrenals/Urinary Tract: Adrenals appear unremarkable bilaterally. 3.5 x  3.0 cm. There is a cyst arising from the posterior upper left kidney measuring 1.4 x 1.4 cm. There is a cyst arising from the lower pole of the left kidney measuring 6.0 x 5.7 cm. There is no evident hydronephrosis on either side. There is a calculus in the mid left kidney measuring 4 x 3 mm. More inferiorly on the left, there is a calculus measuring 7 x 6 mm. There is a 1 mm calculus more inferiorly in the lower pole left kidney. There is no appreciable ureteral calculus on either side. Urinary bladder wall is not appreciably thickened. L lung There is increased attenuation material in the urinary bladder, not present on recent prior study, likely recent hemorrhage. Focal area of hematoma in the inferior urinary bladder measures 6 6.4 x 4.4 cm. Several smaller areas of irregular increased attenuation also felt to be indicative of recent hemorrhage. Stomach/Bowel: There are scattered colonic diverticula throughout the colon without evidence of diverticulitis. No bowel wall or mesenteric thickening. No bowel obstruction. No free air or portal venous air. Vascular/Lymphatic: There is atherosclerotic calcification in the aorta and major mesenteric arterial vessels. No appreciable abdominal aortic aneurysm. There is also atherosclerotic calcification throughout the iliac and visualized femoral arteries. No adenopathy is appreciable in the abdomen or pelvis. Reproductive: Prostate is mildly enlarged but stable. There are multiple prostatic calculi. Seminal vesicles appear normal. No well-defined pelvic mass evident. Other: Appendix appears normal. No abscess or ascites evident in the abdomen or pelvis. There is fat in each inguinal ring. Musculoskeletal: There is degenerative change in the lower thoracic and lumbar spine regions. There is no blastic or lytic bone lesion. IMPRESSION: 1. Increased attenuation material in the urinary bladder with a somewhat irregular contour. Suspect recent hemorrhage. A degree of active  hemorrhage within the urinary bladder cannot be excluded. Urologic assessment advised. Etiology for hemorrhage within the urinary bladder uncertain. No underlying mass seen. An underlying urinary bladder mass cannot be entirely excluded, although no lesion of this nature with seen on recent prior CT. 2. Nonobstructing calculi left kidney. No hydronephrosis or ureteral calculus on either side. 3. Scattered colonic diverticula without diverticulitis. No bowel obstruction. No abscess. Appendix appears normal. 4. Extensive aortic and bilateral iliac and femoral atherosclerotic calcification. Multiple foci of calcification in proximal mesenteric arteries without major vessel obstruction. There  are foci of coronary artery calcification. No aneurysm evident. 5.  Prominent prostate with prostatic calculi present, stable. Aortic Atherosclerosis (ICD10-I70.0). Electronically Signed   By: Bretta BangWilliam  Woodruff III M.D.   On: 07/25/2017 08:28   CT scan was personally reviewed today.  Impression/Plan:   1. Gross hematuria- suspect likely prostatic in origin given nature and onset. Bleeding appears to have subsided, old clot cleared and urine now essentially clear. Foley removed and had a successful voiding trial.  Hbg stable.    We have discussed resumption of finasteride. In the past, he did develop gynecomastia in 2015 with breast tenderness from this medication. I do feel that a second trial this medication may benefit him to shrink his prostate and help with any possible prosthetic bleeding.  I recommended outpatient cystoscopy in approximately 2-3 weeks.  2. Nonobstructing nephrolithiasis- relatively stable, nonobstructing, asymptomatic.  3. Bilateral renal cyst- large left-sided) may be source of chronic left flank pain. We'll address his outpatient.  Urology will sign off. Please let us know if he develops recurrent gross hematuria or inability to void. We will arrange for the appropriate outpatient  follow-up.  07/25/2017, 1:59 PM  Vanna ScotlandAshley Serrina Minogue,  MD

## 2017-07-25 NOTE — Progress Notes (Signed)
RN notified urologist on call about the order for CBI and what size catheter should be placed. New orders to discontinue CBI order, insert a 22 Fr catheter and to hand irrigate catheter as needed for blood clots.   Jose [name] [place]

## 2017-07-25 NOTE — H&P (Signed)
[facility]  [place] [facility]   PATIENT NAME: Jose HeathWilliam Faley    MR#:  [id number]  DATE OF BIRTH:  02/23/1928  DATE OF ADMISSION:  07/25/2017  PRIMARY CARE PHYSICIAN: Malva LimesFisher, Donald E, MD   REQUESTING/REFERRING PHYSICIAN: Dr. Willy EddyPatrick Robinson  CHIEF COMPLAINT:   Chief Complaint  Patient presents with  . Hematuria    HISTORY OF PRESENT ILLNESS:  Jose HeathWilliam Sans  is a 83 y.o. male with a known history of  BPH, CK D, hypertension, sleep apnea presents to hospital secondary to gross hematuria. Patient has history of kidney stones in the past and had lithotripsy done in May 2017. He was admitted to the hospital in May 2018 for sepsis secondary to Escherichia coli. He has been doing well, has chronic left flank pain. He woke up yesterday morning and noticed gross hematuria with bright red blood in the toilet. Initially there was no pain, it during the day started having dysuria and presented to the emergency room today. He was passing clots as well. Not on any blood thinners at home. No fevers or chills. No nausea or vomiting. CT of the abdomen here in the emergency room showing a bladder hematoma and maybe active bleeding at the site. He is being admitted for the same. Hemoglobin is stable at this time.  PAST MEDICAL HISTORY:   Past Medical History:  Diagnosis Date  . Allergy   . BPH (benign prostatic hyperplasia)   . Chronic kidney disease   . DDD (degenerative disc disease), lumbar   . Depression   . Diverticulosis   . Elevated PSA   . Gross hematuria   . Gynecomastia   . Headache   . History of kidney stones   . HTN (hypertension)   . Incomplete bladder emptying   . Nodular prostate with urinary obstruction   . Organic impotence   . OSA (obstructive sleep apnea)   . Renal colic   . Rosacea   . Sciatica   . Urinary frequency     PAST SURGICAL HISTORY:   Past Surgical History:  Procedure Laterality Date  . ANGIOPLASTY  2001   PTCA and stenting  of LAD by Dr. Juliann Paresallwood  . CATARACT EXTRACTION Bilateral 07/2006  . CT Scan of head  12/01/2004   Normal  . DOPPLER ECHOCARDIOGRAPHY  03/19/2013   Normal; Moderate global LV dysfunction. EF=45%. Mild Aortic insufficiency  . Double Ureter stent placement  08/1999   Double J Ureter stent placement  . EXTRACORPOREAL SHOCK WAVE LITHOTRIPSY Right 04/29/2016   Procedure: EXTRACORPOREAL SHOCK WAVE LITHOTRIPSY (ESWL);  Surgeon: Vanna ScotlandAshley Brandon, MD;  Location: [facility] ORS;  Service: Urology;  Laterality: Right;  . MRI CERVICAL SPINE WO CONTRAST ([facility] HX)  01/28/2010   Abnormal Results; Arhritis and bulging discs. Referral to Neurosurgery  . MRI CERVICAL SPINE WO CONTRAST ([facility] HX)  10/19/2005   Abnormal, Bone spurs  . Sleep study  10/06/2011   Severe sleep apnea. AHI= 35.4 per hr. Done at [facility]  . SPIROMETRY  09/11/2007   Moderately Severe obstruction  . TRANSURETHRAL RESECTION OF PROSTATE      SOCIAL HISTORY:   Social History  Substance Use Topics  . Smoking status: Former Smoker    Packs/day: 3.00    Years: 25.00    Types: Cigarettes    Quit date: 12/27/1978  . Smokeless tobacco: Never Used  . Alcohol use No    FAMILY HISTORY:   Family History  Problem Relation Age of Onset  .  Heart disease Father   . Diabetes Father        type 2  . Hyperlipidemia Father   . Stroke Father   . Kidney disease Father   . Hypertension Other   . Breast cancer Other   . Lung cancer Other   . Alcohol abuse Paternal Uncle   . Cancer Sister   . Cancer Son   . Prostate cancer Neg Hx     DRUG ALLERGIES:   Allergies  Allergen Reactions  . Finasteride     Other reaction(s): Other (See Comments) PAIN IN BREAST    REVIEW OF SYSTEMS:   Review of Systems  Constitutional: Negative for chills, fever, malaise/fatigue and weight loss.  HENT: Negative for ear discharge, ear pain, hearing loss, nosebleeds and tinnitus.   Eyes: Negative for blurred vision, double vision and photophobia.    Respiratory: Negative for cough, hemoptysis, shortness of breath and wheezing.   Cardiovascular: Negative for chest pain, palpitations, orthopnea and leg swelling.  Gastrointestinal: Negative for abdominal pain, constipation, diarrhea, heartburn, melena, nausea and vomiting.  Genitourinary: Positive for dysuria and hematuria. Negative for frequency and urgency.  Musculoskeletal: Negative for back pain, myalgias and neck pain.  Skin: Negative for rash.  Neurological: Negative for dizziness, tingling, tremors, sensory change, speech change, focal weakness and headaches.  Endo/Heme/Allergies: Does not bruise/bleed easily.  Psychiatric/Behavioral: Negative for depression.    MEDICATIONS AT HOME:   Prior to Admission medications   Medication Sig Start Date End Date Taking? Authorizing Provider  acetaminophen (TYLENOL) 500 MG tablet Take 500 mg by mouth every 8 (eight) hours as needed.   Yes [provider]  bisoprolol-hydrochlorothiazide [facility]) 5-6.25 MG tablet take 1 tablet by mouth once daily 09/17/16  Yes Fisher, Demetrios Isaacs, MD  simvastatin (ZOCOR) 40 MG tablet Take 1 tablet by mouth daily. 01/21/16  Yes [provider]  bisacodyl (DULCOLAX) 10 MG suppository Place 1 suppository (10 mg total) rectally daily as needed for moderate constipation. Patient not taking: Reported on 07/25/2017 05/03/17   Ramonita Lab, MD  docusate sodium (COLACE) 100 MG capsule Take 1 capsule (100 mg total) by mouth daily. Patient not taking: Reported on 07/25/2017 05/03/17   Ramonita Lab, MD  tamsulosin (FLOMAX) 0.4 MG CAPS capsule Take 1 capsule (0.4 mg total) by mouth daily. Patient not taking: Reported on 07/25/2017 04/29/16   Vanna Scotland, MD      VITAL SIGNS:  Blood pressure (!) 165/100, pulse 64, temperature 97.6 F (36.4 C), temperature source Oral, resp. rate 18, height 5\' 9"  (1.753 m), weight 86.2 kg (190 lb), SpO2 98 %.  PHYSICAL EXAMINATION:   Physical Exam  GENERAL:  83 y.o.-year-old  patient lying in the bed with no acute distress.  EYES: Pupils equal, round, reactive to light and accommodation. No scleral icterus. Extraocular muscles intact.  HEENT: Head atraumatic, normocephalic. Oropharynx and nasopharynx clear.  NECK:  Supple, no jugular venous distention. No thyroid enlargement, no tenderness.  LUNGS: Normal breath sounds bilaterally, no wheezing, rales,rhonchi or crepitation. No use of accessory muscles of respiration. Decreased bibasilar breath sounds CARDIOVASCULAR: S1, S2 normal. No rubs, or gallops. 2/6 systolic murmur present ABDOMEN: Soft, nontender, nondistended. Bowel sounds present. No organomegaly or mass.  EXTREMITIES: No pedal edema, cyanosis, or clubbing.  NEUROLOGIC: Cranial nerves II through XII are intact. Muscle strength 5/5 in all extremities. Sensation intact. Gait not checked.  PSYCHIATRIC: The patient is alert and oriented x 3.  SKIN: No obvious rash, lesion, or ulcer.   LABORATORY PANEL:  CBC  Recent Labs Lab 07/25/17 0420  WBC 9.8  HGB 14.7  HCT 44.0  PLT 227   ------------------------------------------------------------------------------------------------------------------  Chemistries   Recent Labs Lab 07/25/17 0420  NA 142  K 3.9  CL 105  CO2 31  GLUCOSE 127*  BUN 25*  CREATININE 1.26*  CALCIUM 9.1   ------------------------------------------------------------------------------------------------------------------  Cardiac Enzymes No results for input(s): TROPONINI in the last 168 hours. ------------------------------------------------------------------------------------------------------------------  RADIOLOGY:  Ct Renal Stone Study  Result Date: 07/25/2017 CLINICAL DATA:  Hematuria and left flank region pain.  Hypertension. EXAM: CT ABDOMEN AND PELVIS WITHOUT CONTRAST TECHNIQUE: Multidetector CT imaging of the abdomen and pelvis was performed following the standard protocol without oral or intravenous contrast  material administration. COMPARISON:  Apr 30, 2017 FINDINGS: Lower chest: No lung base edema or consolidation. There are multiple foci of coronary artery calcification as well as calcification in the mitral annulus. Hepatobiliary: No focal liver lesions are appreciable on this noncontrast enhanced study. Gallbladder wall is not appreciably thickened. There is no biliary duct dilatation. Pancreas: There is no pancreatic mass or inflammatory focus. Spleen: No splenic lesions are evident. Adrenals/Urinary Tract: Adrenals appear unremarkable bilaterally. 3.5 x 3.0 cm. There is a cyst arising from the posterior upper left kidney measuring 1.4 x 1.4 cm. There is a cyst arising from the lower pole of the left kidney measuring 6.0 x 5.7 cm. There is no evident hydronephrosis on either side. There is a calculus in the mid left kidney measuring 4 x 3 mm. More inferiorly on the left, there is a calculus measuring 7 x 6 mm. There is a 1 mm calculus more inferiorly in the lower pole left kidney. There is no appreciable ureteral calculus on either side. Urinary bladder wall is not appreciably thickened. L lung There is increased attenuation material in the urinary bladder, not present on recent prior study, likely recent hemorrhage. Focal area of hematoma in the inferior urinary bladder measures 6 6.4 x 4.4 cm. Several smaller areas of irregular increased attenuation also felt to be indicative of recent hemorrhage. Stomach/Bowel: There are scattered colonic diverticula throughout the colon without evidence of diverticulitis. No bowel wall or mesenteric thickening. No bowel obstruction. No free air or portal venous air. Vascular/Lymphatic: There is atherosclerotic calcification in the aorta and major mesenteric arterial vessels. No appreciable abdominal aortic aneurysm. There is also atherosclerotic calcification throughout the iliac and visualized femoral arteries. No adenopathy is appreciable in the abdomen or pelvis.  Reproductive: Prostate is mildly enlarged but stable. There are multiple prostatic calculi. Seminal vesicles appear normal. No well-defined pelvic mass evident. Other: Appendix appears normal. No abscess or ascites evident in the abdomen or pelvis. There is fat in each inguinal ring. Musculoskeletal: There is degenerative change in the lower thoracic and lumbar spine regions. There is no blastic or lytic bone lesion. IMPRESSION: 1. Increased attenuation material in the urinary bladder with a somewhat irregular contour. Suspect recent hemorrhage. A degree of active hemorrhage within the urinary bladder cannot be excluded. Urologic assessment advised. Etiology for hemorrhage within the urinary bladder uncertain. No underlying mass seen. An underlying urinary bladder mass cannot be entirely excluded, although no lesion of this nature with seen on recent prior CT. 2. Nonobstructing calculi left kidney. No hydronephrosis or ureteral calculus on either side. 3. Scattered colonic diverticula without diverticulitis. No bowel obstruction. No abscess. Appendix appears normal. 4. Extensive aortic and bilateral iliac and femoral atherosclerotic calcification. Multiple foci of calcification in proximal mesenteric arteries without major vessel obstruction. There  are foci of coronary artery calcification. No aneurysm evident. 5.  Prominent prostate with prostatic calculi present, stable. Aortic Atherosclerosis (ICD10-I70.0). Electronically Signed   By: Bretta BangWilliam  Woodruff III M.D.   On: 07/25/2017 08:28    EKG:   Orders placed or performed during the hospital encounter of 04/30/17  . EKG 12-Lead  . EKG 12-Lead  . ED EKG  . ED EKG  . EKG    IMPRESSION AND PLAN:   Jose [name]  is a 83 y.o. male with a known history of  BPH, CK D, hypertension, sleep apnea presents to hospital secondary to gross hematuria.  #1 gross hematuria-history of renal stones, but only nonobstructing left renal calculi noted at this time. -CT  with possible hemorrhage in the urinary bladder, active hemorrhage cannot be excluded. -Continuous bladder irrigation. Urology consulted - Hemoglobin check every q8h -? Cystoscopy either inpatient or outpatient-keep nothing by mouth for now until urology evaluation - IV fluids  #2 HTN- continue bisoprolol/HCTZ IV hydralazine prn  #3 CKD-Baseline creatinine seems to be around 1.1-1.2. - Patient takes ibuprofen for his left flank pain at home and has been doing that for a long time now. Counseled against using a lot of ibuprofen.  #4 DVT prophylaxis-Ted's and SCDs. No heparin products with active bleeding    All the records are reviewed and case discussed with ED provider. Management plans discussed with the patient, family and they are in agreement.  CODE STATUS: Full code  TOTAL TIME TAKING CARE OF THIS PATIENT: 50 minutes.    Shannon Kirkendall M.D on 07/25/2017 at 10:38 AM  Between 7am to 6pm - Pager - [phone]  After 6pm go to [email or web address] - Social research officer, governmentpassword [email or web address]  [facility]  Office  [phone]  CC: Primary care physician; Malva LimesFisher, Donald E, MD

## 2017-07-25 NOTE — ED Triage Notes (Signed)
Patient c/o hematuria beginning Sunday am and left kidney pain. Pt reports hx of kidney stones and hypertension

## 2017-07-26 DIAGNOSIS — R319 Hematuria, unspecified: Secondary | ICD-10-CM | POA: Diagnosis not present

## 2017-07-26 DIAGNOSIS — I10 Essential (primary) hypertension: Secondary | ICD-10-CM | POA: Diagnosis not present

## 2017-07-26 DIAGNOSIS — E785 Hyperlipidemia, unspecified: Secondary | ICD-10-CM | POA: Diagnosis not present

## 2017-07-26 LAB — CBC
HEMATOCRIT: 41.2 % (ref 40.0–52.0)
HEMOGLOBIN: 14 g/dL (ref 13.0–18.0)
MCH: 29.8 pg (ref 26.0–34.0)
MCHC: 33.9 g/dL (ref 32.0–36.0)
MCV: 87.9 fL (ref 80.0–100.0)
Platelets: 212 10*3/uL (ref 150–440)
RBC: 4.68 MIL/uL (ref 4.40–5.90)
RDW: 14.8 % — ABNORMAL HIGH (ref 11.5–14.5)
WBC: 9.6 10*3/uL (ref 3.8–10.6)

## 2017-07-26 LAB — BASIC METABOLIC PANEL
Anion gap: 6 (ref 5–15)
BUN: 27 mg/dL — ABNORMAL HIGH (ref 6–20)
CHLORIDE: 107 mmol/L (ref 101–111)
CO2: 26 mmol/L (ref 22–32)
CREATININE: 1.13 mg/dL (ref 0.61–1.24)
Calcium: 8.6 mg/dL — ABNORMAL LOW (ref 8.9–10.3)
GFR calc non Af Amer: 56 mL/min — ABNORMAL LOW (ref >60)
Glucose, Bld: 102 mg/dL — ABNORMAL HIGH (ref 65–99)
POTASSIUM: 3.9 mmol/L (ref 3.5–5.1)
SODIUM: 139 mmol/L (ref 135–145)

## 2017-07-26 MED ORDER — TAMSULOSIN HCL 0.4 MG PO CAPS: 0.4000 mg | ORAL_CAPSULE | Freq: Every day | ORAL | 0 refills | Status: DC

## 2017-07-26 MED ORDER — FINASTERIDE 5 MG PO TABS: 5.0000 mg | ORAL_TABLET | Freq: Every day | ORAL | 1 refills | Status: AC

## 2017-07-26 MED ORDER — AMLODIPINE BESYLATE 5 MG PO TABS
5.0000 mg | ORAL_TABLET | Freq: Every day | ORAL | Status: DC
  Administered 2017-07-26: 5 mg via ORAL
  Filled 2017-07-26: qty 1

## 2017-07-26 MED ORDER — AMLODIPINE BESYLATE 5 MG PO TABS: 5.0000 mg | ORAL_TABLET | Freq: Every day | ORAL | 2 refills | Status: DC

## 2017-07-26 NOTE — Progress Notes (Signed)
Patient discharged to home as ordered, discharge instructions given as ordered, follow up appointments given as ordered. IV discontinued site clean dry and intact. Patient alert and oriented, patient denies pain at this time. Patient ambulates well without assistance, and patient son at the bedside to take patient home.

## 2017-07-26 NOTE — Discharge Summary (Signed)
[facility] - [place] [facility]   PATIENT NAME: Jose [name]    MR#:  [id number]  DATE OF BIRTH:  Apr 05, 1928  DATE OF ADMISSION:  07/25/2017   ADMITTING PHYSICIAN: Enid Baas, MD  DATE OF DISCHARGE: 07/26/2017 12:10 PM  PRIMARY CARE PHYSICIAN: Malva Limes, MD   ADMISSION DIAGNOSIS:   Gross hematuria [R31.0]  DISCHARGE DIAGNOSIS:   Active Problems:   Hematuria   SECONDARY DIAGNOSIS:   Past Medical History:  Diagnosis Date  . Allergy   . BPH (benign prostatic hyperplasia)   . Chronic kidney disease   . DDD (degenerative disc disease), lumbar   . Depression   . Diverticulosis   . Elevated PSA   . Gross hematuria   . Gynecomastia   . Headache   . History of kidney stones   . HTN (hypertension)   . Incomplete bladder emptying   . Nodular prostate with urinary obstruction   . Organic impotence   . OSA (obstructive sleep apnea)   . Renal colic   . Rosacea   . Sciatica   . Urinary frequency     HOSPITAL COURSE:   Jose [name]  is a 83 y.o. male with a known history of  BPH, CK D, hypertension, sleep apnea presents to hospital secondary to gross hematuria.  #1 Gross hematuria-Appreciate urology consult. Initially admitted for possible continuous bladder irrigation. -However with manual irrigation with the Foley catheter most of the clots were expressed. Foley was removed and patient was able to urinate amber colored urine. -No drop in hemoglobin reported. -Neurology feels that this could be from his prostrate and he is started back on finasteride and Flomax at discharge. -Outpatient cystoscopy in 2-3 weeks. -history of renal stones, but only nonobstructing left renal calculi noted at this time.  #2 HTN- continue bisoprolol/HCTZ Due to elevated blood pressures, Norvasc has been added  #3 hyperlipidemia-continue statin  #4 CKD-Baseline creatinine seems to be around 1.1-1.2. - Patient takes ibuprofen for his left flank pain  at home and has been doing that for a long time now. Counseled against using a lot of ibuprofen.  #5 chronic flank pain on the left side-secondary to possible enlarge renal cysts. Outpatient follow-up with urology  DISCHARGE CONDITIONS:   Guarded  CONSULTS OBTAINED:   Treatment Team:  Bjorn Pippin, MD  DRUG ALLERGIES:   Allergies  Allergen Reactions  . Finasteride     Other reaction(s): Other (See Comments) PAIN IN BREAST   DISCHARGE MEDICATIONS:   Allergies as of 07/26/2017      Reactions   Finasteride    Other reaction(s): Other (See Comments) PAIN IN BREAST      Medication List    STOP taking these medications   bisacodyl 10 MG suppository Commonly known as:  DULCOLAX   docusate sodium 100 MG capsule Commonly known as:  COLACE     TAKE these medications   acetaminophen 500 MG tablet Commonly known as:  TYLENOL Take 500 mg by mouth every 8 (eight) hours as needed.   amLODipine 5 MG tablet Commonly known as:  NORVASC Take 1 tablet (5 mg total) by mouth daily.   bisoprolol-hydrochlorothiazide 5-6.25 MG tablet Commonly known as:  ZIAC take 1 tablet by mouth once daily   finasteride 5 MG tablet Commonly known as:  PROSCAR Take 1 tablet (5 mg total) by mouth daily.   simvastatin 40 MG tablet Commonly known as:  ZOCOR Take 1 tablet by mouth daily.   tamsulosin 0.4  MG Caps capsule Commonly known as:  FLOMAX Take 1 capsule (0.4 mg total) by mouth daily.        DISCHARGE INSTRUCTIONS:   1. PCP follow-up in 1-2 weeks 2. Urology follow-up in 2 weeks  DIET:   Cardiac diet  ACTIVITY:   Activity as tolerated  OXYGEN:   Home Oxygen: No.  Oxygen Delivery: room air  DISCHARGE LOCATION:   home   If you experience worsening of your admission symptoms, develop shortness of breath, life threatening emergency, suicidal or homicidal thoughts you must seek medical attention immediately by calling 911 or calling your MD immediately  if symptoms less  severe.  You Must read complete instructions/literature along with all the possible adverse reactions/side effects for all the Medicines you take and that have been prescribed to you. Take any new Medicines after you have completely understood and accpet all the possible adverse reactions/side effects.   Please note  You were cared for by a hospitalist during your hospital stay. If you have any questions about your discharge medications or the care you received while you were in the hospital after you are discharged, you can call the unit and asked to speak with the hospitalist on call if the hospitalist that took care of you is not available. Once you are discharged, your primary care physician will handle any further medical issues. Please note that NO REFILLS for any discharge medications will be authorized once you are discharged, as it is imperative that you return to your primary care physician (or establish a relationship with a primary care physician if you do not have one) for your aftercare needs so that they can reassess your need for medications and monitor your lab values.    On the day of Discharge:  VITAL SIGNS:   Blood pressure (!) 173/51, pulse (!) 59, temperature 98.1 F (36.7 C), temperature source Oral, resp. rate 18, height 5\' 9"  (1.753 m), weight 86.2 kg (190 lb), SpO2 96 %.  PHYSICAL EXAMINATION:   GENERAL:  83 y.o.-year-old patient lying in the bed with no acute distress.  EYES: Pupils equal, round, reactive to light and accommodation. No scleral icterus. Extraocular muscles intact.  HEENT: Head atraumatic, normocephalic. Oropharynx and nasopharynx clear.  NECK:  Supple, no jugular venous distention. No thyroid enlargement, no tenderness.  LUNGS: Normal breath sounds bilaterally, no wheezing, rales,rhonchi or crepitation. No use of accessory muscles of respiration. Decreased bibasilar breath sounds CARDIOVASCULAR: S1, S2 normal. No rubs, or gallops. 2/6 systolic murmur  present ABDOMEN: Soft, nontender, nondistended. Bowel sounds present. No organomegaly or mass.  EXTREMITIES: No pedal edema, cyanosis, or clubbing.  NEUROLOGIC: Cranial nerves II through XII are intact. Muscle strength 5/5 in all extremities. Sensation intact. Gait not checked.  PSYCHIATRIC: The patient is alert and oriented x 3.  SKIN: No obvious rash, lesion, or ulcer.   DATA REVIEW:   CBC  Recent Labs Lab 07/26/17 0305  WBC 9.6  HGB 14.0  HCT 41.2  PLT 212    Chemistries   Recent Labs Lab 07/26/17 0305  NA 139  K 3.9  CL 107  CO2 26  GLUCOSE 102*  BUN 27*  CREATININE 1.13  CALCIUM 8.6*     Microbiology Results  Results for orders placed or performed during the hospital encounter of 04/30/17  Urine culture     Status: Abnormal   Collection Time: 04/30/17  3:56 PM  Result Value Ref Range Status   Specimen Description URINE, CATHETERIZED  Final  Special Requests Normal  Final   Culture >=100,000 COLONIES/mL ESCHERICHIA COLI (A)  Final   Report Status 05/03/2017 FINAL  Final   Organism ID, Bacteria ESCHERICHIA COLI (A)  Final      Susceptibility   Escherichia coli - MIC*    AMPICILLIN >=32 RESISTANT Resistant     CEFAZOLIN >=64 RESISTANT Resistant     CEFTRIAXONE <=1 SENSITIVE Sensitive     CIPROFLOXACIN <=0.25 SENSITIVE Sensitive     GENTAMICIN <=1 SENSITIVE Sensitive     IMIPENEM <=0.25 SENSITIVE Sensitive     NITROFURANTOIN <=16 SENSITIVE Sensitive     TRIMETH/SULFA <=20 SENSITIVE Sensitive     AMPICILLIN/SULBACTAM 16 INTERMEDIATE Intermediate     PIP/TAZO 8 SENSITIVE Sensitive     Extended ESBL NEGATIVE Sensitive     * >=100,000 COLONIES/mL ESCHERICHIA COLI    RADIOLOGY:  No results found.   Management plans discussed with the patient, family and they are in agreement.  CODE STATUS:     Code Status Orders        Start     Ordered   07/25/17 1112  Full code  Continuous     07/25/17 1112    Code Status History    Date Active Date  Inactive Code Status Order ID Comments User Context   04/30/2017  8:05 PM 05/03/2017  5:13 PM Full Code [id number]  Houston SirenSainani, Vivek J, MD ED    Advance Directive Documentation     Most Recent Value  Type of Advance Directive  Healthcare Power of Attorney, Living will  Pre-existing out of facility DNR order (yellow form or pink MOST form)  -  "MOST" Form in Place?  -      TOTAL TIME TAKING CARE OF THIS PATIENT: 37 minutes.    Enid BaasKALISETTI,Pharaoh Pio M.D on 07/26/2017 at 2:21 PM  Between 7am to 6pm - Pager - [phone]  After 6pm go to [email or web address] - Social research officer, governmentpassword EPAS [facility]  [facility] [place]  Office  [phone]  CC: Primary care physician; Malva LimesFisher, Donald E, MD   Note: This dictation was prepared with Dragon dictation along with smaller phrase technology. Any transcriptional errors that result from this process are unintentional.

## 2017-07-26 NOTE — Evaluation (Signed)
Physical Therapy Evaluation Patient Details Name: Jose HuhWilliam E Kearl Jr. MRN: [id number] DOB: 09/17/1928 Today's Date: 07/26/2017   History of Present Illness  Patient is a 83 y/o male that presents with hematuria, which has largely resolved with Foley and hand irrigation.  Clinical Impression  Patient is a pleasant 83 y/o male that presents with hematuria. He has been driving and ambulating with RW modified independently at home, reporting only 1 fall due to being fatigued and trying to pick something up several months ago. He reports chronic lower back pain, which increases with prolonged ambulation. Reports this occurs today as well, he was noted to have trunk flexed posture in gait with RW. Otherwise he appears to physically be at his baseline and is able to ambulate ~175' with no loss of balance with RW and no adverse reactions aside from low back pain. Would recommend HHPT to reinforce upright posture in gait and there-ex to strengthen LEs to facilitate improved posture.     Follow Up Recommendations Home health PT    Equipment Recommendations  Rolling walker with 5" wheels    Recommendations for Other Services       Precautions / Restrictions Precautions Precautions: None Restrictions Weight Bearing Restrictions: No      Mobility  Bed Mobility               General bed mobility comments: Patient in chair to begin session.   Transfers Overall transfer level: Modified independent Equipment used: Rolling walker (2 wheeled)             General transfer comment: No deficits noted in transfer.   Ambulation/Gait Ambulation/Gait assistance: Modified independent (Device/Increase time) Ambulation Distance (Feet): 175 Feet Assistive device: Rolling walker (2 wheeled) Gait Pattern/deviations: WFL(Within Functional Limits);Trunk flexed   Gait velocity interpretation: Below normal speed for age/gender General Gait Details: Patient reports increasing chronic low back pain  with prolonged ambulation, flexed trunk posture otherwise no gait abnormalities noted.   Stairs            Wheelchair Mobility    Modified Rankin (Stroke Patients Only)       Balance Overall balance assessment: Modified Independent                                           Pertinent Vitals/Pain Pain Assessment:  (Reports no pain at rest, but increasing low back pain (chronic) with ambulation)    Home Living Family/patient expects to be discharged to:: Private residence Living Arrangements: Alone   Type of Home: House Home Access: Stairs to enter Entrance Stairs-Rails: Right Entrance Stairs-Number of Steps: 5 steps Home Layout: One level Home Equipment: Walker - 2 wheels      Prior Function Level of Independence: Independent with assistive device(s)         Comments: Patient reports 1 fall in the past 6 months due to fatigue. Has been using a RW at home, most limited by back pain.      Hand Dominance        Extremity/Trunk Assessment   Upper Extremity Assessment Upper Extremity Assessment: Overall WFL for tasks assessed    Lower Extremity Assessment Lower Extremity Assessment: Overall WFL for tasks assessed       Communication   Communication: No difficulties  Cognition Arousal/Alertness: Awake/alert Behavior During Therapy: WFL for tasks assessed/performed Overall Cognitive Status: Within Functional Limits for tasks assessed  General Comments      Exercises     Assessment/Plan    PT Assessment Patient needs continued PT services  PT Problem List Decreased strength;Decreased mobility;Pain;Decreased knowledge of use of DME;Decreased balance;Decreased activity tolerance       PT Treatment Interventions DME instruction;Gait training;Therapeutic exercise;Therapeutic activities;Balance training;Stair training;Manual techniques;Neuromuscular re-education;Functional  mobility training    PT Goals (Current goals can be found in the Care Plan section)  Acute Rehab PT Goals Patient Stated Goal: To return home PT Goal Formulation: With patient Time For Goal Achievement: 08/09/17 Potential to Achieve Goals: Good    Frequency Min 2X/week   Barriers to discharge Decreased caregiver support      Co-evaluation               AM-PAC PT "6 Clicks" Daily Activity  Outcome Measure Difficulty turning over in bed (including adjusting bedclothes, sheets and blankets)?: None Difficulty moving from lying on back to sitting on the side of the bed? : None Difficulty sitting down on and standing up from a chair with arms (e.g., wheelchair, bedside commode, etc,.)?: A Little Help needed moving to and from a bed to chair (including a wheelchair)?: A Little Help needed walking in hospital room?: None Help needed climbing 3-5 steps with a railing? : A Little 6 Click Score: 21    End of Session Equipment Utilized During Treatment: Gait belt Activity Tolerance: Patient tolerated treatment well Patient left: in chair;with call bell/phone within reach (No chair alarm in place when PT entered the room) Nurse Communication: Mobility status PT Visit Diagnosis: Difficulty in walking, not elsewhere classified (R26.2)    Time: [id number] PT Time Calculation (min) (ACUTE ONLY): 8 min   Charges:   PT Evaluation $PT Eval Moderate Complexity: 1 Mod     PT G Codes:   PT G-Codes **NOT FOR INPATIENT CLASS** Functional Assessment Tool Used: AM-PAC 6 Clicks Basic Mobility Functional Limitation: Mobility: Walking and moving around Mobility: Walking and Moving Around Current Status (V4098(G8978): At least 1 percent but less than 20 percent impaired, limited or restricted Mobility: Walking and Moving Around Goal Status [id number](G8979): At least 1 percent but less than 20 percent impaired, limited or restricted    Jose [name] PT, DPT, CSCS    07/26/2017, 11:14 AM

## 2017-07-26 NOTE — Care Management (Signed)
Patient admitted with hematuia.  Patient seen by urology while in patient.  Patient lives at home alone.  Adult son lives locally for support.  PCP Fisher.  Pharmacy [facility].  PT has assessed patient and recommends home health PT.  Patient agreeable to home health services.  [facility]  Patient has RW in the home for ambulation.  Patient provided home health agency preference.  Patient states that he does not have an agency preference.  Patient also to discharge on new medication for blood pressure control.  Home health orders placed for RN and PT.  [facility].  RNCM signing off

## 2017-07-27 ENCOUNTER — Telehealth: Payer: Self-pay

## 2017-07-27 DIAGNOSIS — R7303 Prediabetes: Secondary | ICD-10-CM | POA: Diagnosis not present

## 2017-07-27 DIAGNOSIS — M519 Unspecified thoracic, thoracolumbar and lumbosacral intervertebral disc disorder: Secondary | ICD-10-CM | POA: Diagnosis not present

## 2017-07-27 DIAGNOSIS — I2510 Atherosclerotic heart disease of native coronary artery without angina pectoris: Secondary | ICD-10-CM | POA: Diagnosis not present

## 2017-07-27 DIAGNOSIS — N183 Chronic kidney disease, stage 3 (moderate): Secondary | ICD-10-CM | POA: Diagnosis not present

## 2017-07-27 DIAGNOSIS — N200 Calculus of kidney: Secondary | ICD-10-CM | POA: Diagnosis not present

## 2017-07-27 DIAGNOSIS — N138 Other obstructive and reflux uropathy: Secondary | ICD-10-CM | POA: Diagnosis not present

## 2017-07-27 DIAGNOSIS — M1991 Primary osteoarthritis, unspecified site: Secondary | ICD-10-CM | POA: Diagnosis not present

## 2017-07-27 DIAGNOSIS — I129 Hypertensive chronic kidney disease with stage 1 through stage 4 chronic kidney disease, or unspecified chronic kidney disease: Secondary | ICD-10-CM | POA: Diagnosis not present

## 2017-07-27 DIAGNOSIS — J449 Chronic obstructive pulmonary disease, unspecified: Secondary | ICD-10-CM | POA: Diagnosis not present

## 2017-07-27 DIAGNOSIS — N401 Enlarged prostate with lower urinary tract symptoms: Secondary | ICD-10-CM | POA: Diagnosis not present

## 2017-07-28 NOTE — Telephone Encounter (Signed)
Transition Care Management Follow-up Telephone Call    Date discharged? 07/26/17  How have you been since you were released from the hospital? Recovering, but tired. Pt did see blood in the urine last night but none today. Pt does not want his bladder flushed again. Denies pain, fever or any urine abnormalities.  Any patient concerns? None.   Items Reviewed:  Medications reviewed: Yes  Allergies reviewed: Yes  Dietary changes reviewed: N/A  Referrals reviewed: Yes, Dr. Apolinar JunesBrandon on 08/09/17   Functional Questionnaire:  Independent - I Dependent - D    Activities of Daily Living (ADLs):    Personal hygiene - I Dressing - I Eating - I Maintaining continence - I Transferring - I   Independent Activities of Daily Living (iADLs): Basic communication skills - I Transportation - I Meal preparation - I Shopping - I Housework - Needs assistance Managing medications - I  Managing personal finances - I    Confirmed importance and date/time /o/f follow-up visits scheduled YES  Provider Appointment booked with PCP 08/01/17 @ 3:00 PM  Confirmed with patient if condition begins to worsen call PCP or go to the ER.  Patient was given the office number and encouraged to call back with question or concerns: YES

## 2017-08-01 ENCOUNTER — Ambulatory Visit (INDEPENDENT_AMBULATORY_CARE_PROVIDER_SITE_OTHER): Payer: Medicare HMO | Admitting: Family Medicine

## 2017-08-01 ENCOUNTER — Encounter: Payer: Self-pay | Admitting: Family Medicine

## 2017-08-01 VITALS — BP 130/70 | HR 70 | Temp 98.7°F | Resp 16 | Ht 69.0 in | Wt 199.0 lb

## 2017-08-01 DIAGNOSIS — I10 Essential (primary) hypertension: Secondary | ICD-10-CM | POA: Diagnosis not present

## 2017-08-01 DIAGNOSIS — Z6829 Body mass index (BMI) 29.0-29.9, adult: Secondary | ICD-10-CM | POA: Diagnosis not present

## 2017-08-01 DIAGNOSIS — R319 Hematuria, unspecified: Secondary | ICD-10-CM | POA: Diagnosis not present

## 2017-08-01 LAB — POCT URINALYSIS DIPSTICK
Bilirubin, UA: NEGATIVE
GLUCOSE UA: NEGATIVE
Ketones, UA: NEGATIVE
LEUKOCYTES UA: NEGATIVE
NITRITE UA: NEGATIVE
Protein, UA: 30
Spec Grav, UA: 1.025 (ref 1.010–1.025)
UROBILINOGEN UA: 0.2 U/dL
pH, UA: 6 (ref 5.0–8.0)

## 2017-08-01 NOTE — Progress Notes (Signed)
Patient: Jose [name]. Male    DOB: Jul 31, 1928   83 y.o.   MRN: [id number] Visit Date: 08/01/2017  Today's Provider: Mila Merry, MD   Chief Complaint  Patient presents with  . Hospitalization Follow-up   Subjective:    HPI     Follow up Hospitalization  Patient was admitted to 07/25/2017 on [facility] and discharged on 07/26/2017. He was treated for gross Hematuria. Treatment for this included; was given liter of normal saline to help clear his urine. CT scan showed possible hemorrhage in the bladder. Has follow-up with Dr. Apolinar Junes (urologist) on 08/09/2017. Was started on amlodipine due to elevated blood pressure and finasteride.. Telephone follow up was done on yes He reports good compliance with treatment. He reports this condition is Unchanged.  ----------------------------------------------------------------  Patient stated that he is not feeling any better then when he did in the hospital. He did notice small amount of blood yesterday, but non today.    Allergies  Allergen Reactions  . Finasteride     Other reaction(s): Other (See Comments) PAIN IN BREAST     Current Outpatient Prescriptions:  .  amLODipine (NORVASC) 5 MG tablet, Take 1 tablet (5 mg total) by mouth daily., Disp: 30 tablet, Rfl: 2 .  bisoprolol-hydrochlorothiazide (ZIAC) 5-6.25 MG tablet, take 1 tablet by mouth once daily, Disp: 30 tablet, Rfl: 8 .  finasteride (PROSCAR) 5 MG tablet, Take 1 tablet (5 mg total) by mouth daily., Disp: 30 tablet, Rfl: 1 .  simvastatin (ZOCOR) 40 MG tablet, Take 1 tablet by mouth daily., Disp: , Rfl: 0 .  tamsulosin (FLOMAX) 0.4 MG CAPS capsule, Take 1 capsule (0.4 mg total) by mouth daily., Disp: 30 capsule, Rfl: 0  Review of Systems  Constitutional: Negative for appetite change, chills and fever.  Respiratory: Negative for chest tightness, shortness of breath and wheezing.   Cardiovascular: Negative for chest pain and palpitations.  Gastrointestinal:  Negative for abdominal pain, nausea and vomiting.  Genitourinary: Positive for hematuria.  Musculoskeletal: Positive for back pain.    Social History  Substance Use Topics  . Smoking status: Former Smoker    Packs/day: 3.00    Years: 25.00    Types: Cigarettes    Quit date: 12/27/1978  . Smokeless tobacco: Never Used  . Alcohol use No   Objective:   BP 130/70 (BP Location: Right Arm, Patient Position: Sitting, Cuff Size: Normal)   Pulse 70   Temp 98.7 F (37.1 C) (Oral)   Resp 16   Ht 5\' 9"  (1.753 m)   Wt 199 lb (90.3 kg)   SpO2 97%   BMI 29.39 kg/m  Vitals:   08/01/17 1510  BP: 130/70  Pulse: 70  Resp: 16  Temp: 98.7 F (37.1 C)  TempSrc: Oral  SpO2: 97%  Weight: 199 lb (90.3 kg)  Height: 5\' 9"  (1.753 m)     Physical Exam   General Appearance:    Alert, cooperative, no distress  Eyes:    PERRL, conjunctiva/corneas clear, EOM's intact       Lungs:     Clear to auscultation bilaterally, respirations unlabored  Heart:    Regular rate and rhythm  Neurologic:   Awake, alert, oriented x 3. No apparent focal neurological           defect.       Results for orders placed or performed in visit on 08/01/17  POCT urinalysis dipstick  Result Value Ref Range   Color, UA  Yellow    Clarity, UA Slightly Cloudy    Glucose, UA Neg    Bilirubin, UA Neg    Ketones, UA Neg    Spec Grav, UA 1.025 1.010 - 1.025   Blood, UA trace    pH, UA 6.0 5.0 - 8.0   Protein, UA 30    Urobilinogen, UA 0.2 0.2 or 1.0 E.U./dL   Nitrite, UA Neg    Leukocytes, UA Negative Negative        Assessment & Plan:     1. Hematuria, unspecified type Mostly resolved. Follow up with Dr. Apolinar JunesBrandon as scheduled.  - POCT urinalysis dipstick  2. Essential (primary) hypertension BP well controlled today. Continue current medications.          Mila Merryonald Fisher, MD  [facility]

## 2017-08-02 ENCOUNTER — Encounter: Payer: Self-pay | Admitting: Urology

## 2017-08-02 ENCOUNTER — Ambulatory Visit: Payer: Medicare HMO | Admitting: Urology

## 2017-08-02 ENCOUNTER — Other Ambulatory Visit: Payer: Self-pay | Admitting: Family Medicine

## 2017-08-02 VITALS — BP 104/66 | HR 160 | Ht 69.0 in | Wt 193.7 lb

## 2017-08-02 DIAGNOSIS — N200 Calculus of kidney: Secondary | ICD-10-CM

## 2017-08-02 DIAGNOSIS — N400 Enlarged prostate without lower urinary tract symptoms: Secondary | ICD-10-CM

## 2017-08-02 DIAGNOSIS — R310 Gross hematuria: Secondary | ICD-10-CM | POA: Diagnosis not present

## 2017-08-02 MED ORDER — LIDOCAINE HCL 2 % EX GEL: 1.0000 "application " | Freq: Once | CUTANEOUS | Status: DC

## 2017-08-02 MED ORDER — CIPROFLOXACIN HCL 500 MG PO TABS
500.0000 mg | ORAL_TABLET | Freq: Once | ORAL | Status: AC
  Administered 2017-08-02: 500 mg via ORAL

## 2017-08-02 MED ORDER — LIDOCAINE HCL 2 % EX GEL
1.0000 "application " | Freq: Once | CUTANEOUS | Status: AC
  Administered 2017-08-02: 1 via URETHRAL

## 2017-08-02 MED ORDER — CIPROFLOXACIN HCL 500 MG PO TABS
500.0000 mg | ORAL_TABLET | Freq: Once | ORAL | Status: DC
Start: 2017-08-02 — End: 2017-08-02

## 2017-08-02 NOTE — Progress Notes (Signed)
   08/02/17  CC:  Chief Complaint  Patient presents with  . Cysto    HPI: Jose HuhWilliam E Zeman Jr. is a 83 y.o. year old with a personal history of BPH and kidney stones who Was recently seen in the emergency department for difficulty voiding and passing blood with clots.Further workup in the ER included CT renal stone protocol due to presence of hematuria and complaints of pain in the left flank which is chronic.  Stable nonobstructing calculi were patient within the kidneys but no obstructing stones. Within the bladder, there was an area of hemorrhage/clot occupying 6.4 x 4.4 cm. He was also noted to have an enlarged prostate. Upon arrival to the urology floor, nursing staff was able to place a 22 [place] two-way Foley catheter and hand irrigate a large amount of clot until the patient's bladder was cleared.  He does have a known history of BPH. He was previously on Flomax and finasteride. These were restarted at the time of his admission to the hospital. He did not start taking this until 2 days ago though.  His Foley was eventually removed a pass a trial of void. He presents today for cystoscopy to complete his hematuria workup.  There were no vitals taken for this visit. NED. A&Ox3.   No respiratory distress   Abd soft, NT, ND Normal phallus with bilateral descended testicles  Cystoscopy Procedure Note  Patient identification was confirmed, informed consent was obtained, and patient was prepped using Betadine solution.  Lidocaine jelly was administered per urethral meatus.    Preoperative abx where received prior to procedure.     Pre-Procedure: - Inspection reveals a normal caliber ureteral meatus.  Procedure: The flexible cystoscope was introduced without difficulty - No urethral strictures/lesions are present. - Enlarged prostate profoundly hypervascular prostate with multiple large blood vessels. No active bleeding currently. - Normal bladder neck - Bilateral ureteral  orifices identified - Bladder mucosa  reveals no ulcers, tumors, or lesions - No bladder stones - No trabeculation  Retroflexion shows no intravesical lobe.  Post-Procedure: - Patient tolerated the procedure well  Assessment/ Plan:  1. Gross hematuria Likely secondary to his markedly hypervascular prostate. Advised patient to continue finasteride which he just restarted which should help with this.  2. Nonobstructing nephrolithiasis -relatively stable, nonobstructing, asymptomatic.  3. BPH -continue flomax and finasteride for now.He does have distant history of gynecomastia with tenderness in the past. We'll need to stop finasteride if this develops. This should help prevent further episodes of gross hematuria as well as this was the likely source of his recent incident.

## 2017-08-03 DIAGNOSIS — I129 Hypertensive chronic kidney disease with stage 1 through stage 4 chronic kidney disease, or unspecified chronic kidney disease: Secondary | ICD-10-CM | POA: Diagnosis not present

## 2017-08-03 DIAGNOSIS — N138 Other obstructive and reflux uropathy: Secondary | ICD-10-CM | POA: Diagnosis not present

## 2017-08-03 DIAGNOSIS — J449 Chronic obstructive pulmonary disease, unspecified: Secondary | ICD-10-CM | POA: Diagnosis not present

## 2017-08-03 DIAGNOSIS — M519 Unspecified thoracic, thoracolumbar and lumbosacral intervertebral disc disorder: Secondary | ICD-10-CM | POA: Diagnosis not present

## 2017-08-03 DIAGNOSIS — N401 Enlarged prostate with lower urinary tract symptoms: Secondary | ICD-10-CM | POA: Diagnosis not present

## 2017-08-03 DIAGNOSIS — N200 Calculus of kidney: Secondary | ICD-10-CM | POA: Diagnosis not present

## 2017-08-03 DIAGNOSIS — R7303 Prediabetes: Secondary | ICD-10-CM | POA: Diagnosis not present

## 2017-08-03 DIAGNOSIS — M1991 Primary osteoarthritis, unspecified site: Secondary | ICD-10-CM | POA: Diagnosis not present

## 2017-08-03 DIAGNOSIS — N183 Chronic kidney disease, stage 3 (moderate): Secondary | ICD-10-CM | POA: Diagnosis not present

## 2017-08-03 DIAGNOSIS — I2510 Atherosclerotic heart disease of native coronary artery without angina pectoris: Secondary | ICD-10-CM | POA: Diagnosis not present

## 2017-08-04 DIAGNOSIS — N200 Calculus of kidney: Secondary | ICD-10-CM | POA: Diagnosis not present

## 2017-08-04 DIAGNOSIS — N183 Chronic kidney disease, stage 3 (moderate): Secondary | ICD-10-CM | POA: Diagnosis not present

## 2017-08-04 DIAGNOSIS — R7303 Prediabetes: Secondary | ICD-10-CM | POA: Diagnosis not present

## 2017-08-04 DIAGNOSIS — N401 Enlarged prostate with lower urinary tract symptoms: Secondary | ICD-10-CM | POA: Diagnosis not present

## 2017-08-04 DIAGNOSIS — M1991 Primary osteoarthritis, unspecified site: Secondary | ICD-10-CM | POA: Diagnosis not present

## 2017-08-04 DIAGNOSIS — I2510 Atherosclerotic heart disease of native coronary artery without angina pectoris: Secondary | ICD-10-CM | POA: Diagnosis not present

## 2017-08-04 DIAGNOSIS — M519 Unspecified thoracic, thoracolumbar and lumbosacral intervertebral disc disorder: Secondary | ICD-10-CM | POA: Diagnosis not present

## 2017-08-04 DIAGNOSIS — N138 Other obstructive and reflux uropathy: Secondary | ICD-10-CM | POA: Diagnosis not present

## 2017-08-04 DIAGNOSIS — J449 Chronic obstructive pulmonary disease, unspecified: Secondary | ICD-10-CM | POA: Diagnosis not present

## 2017-08-04 DIAGNOSIS — I129 Hypertensive chronic kidney disease with stage 1 through stage 4 chronic kidney disease, or unspecified chronic kidney disease: Secondary | ICD-10-CM | POA: Diagnosis not present

## 2017-08-05 DIAGNOSIS — H34832 Tributary (branch) retinal vein occlusion, left eye, with macular edema: Secondary | ICD-10-CM | POA: Diagnosis not present

## 2017-08-07 DIAGNOSIS — N138 Other obstructive and reflux uropathy: Secondary | ICD-10-CM | POA: Diagnosis not present

## 2017-08-07 DIAGNOSIS — M1991 Primary osteoarthritis, unspecified site: Secondary | ICD-10-CM | POA: Diagnosis not present

## 2017-08-07 DIAGNOSIS — J449 Chronic obstructive pulmonary disease, unspecified: Secondary | ICD-10-CM | POA: Diagnosis not present

## 2017-08-07 DIAGNOSIS — M519 Unspecified thoracic, thoracolumbar and lumbosacral intervertebral disc disorder: Secondary | ICD-10-CM | POA: Diagnosis not present

## 2017-08-07 DIAGNOSIS — N401 Enlarged prostate with lower urinary tract symptoms: Secondary | ICD-10-CM | POA: Diagnosis not present

## 2017-08-07 DIAGNOSIS — R7303 Prediabetes: Secondary | ICD-10-CM | POA: Diagnosis not present

## 2017-08-07 DIAGNOSIS — F329 Major depressive disorder, single episode, unspecified: Secondary | ICD-10-CM | POA: Diagnosis not present

## 2017-08-07 DIAGNOSIS — I2510 Atherosclerotic heart disease of native coronary artery without angina pectoris: Secondary | ICD-10-CM | POA: Diagnosis not present

## 2017-08-07 DIAGNOSIS — N200 Calculus of kidney: Secondary | ICD-10-CM | POA: Diagnosis not present

## 2017-08-07 DIAGNOSIS — N183 Chronic kidney disease, stage 3 (moderate): Secondary | ICD-10-CM | POA: Diagnosis not present

## 2017-08-07 DIAGNOSIS — I129 Hypertensive chronic kidney disease with stage 1 through stage 4 chronic kidney disease, or unspecified chronic kidney disease: Secondary | ICD-10-CM | POA: Diagnosis not present

## 2017-08-09 ENCOUNTER — Other Ambulatory Visit: Payer: Medicare HMO | Admitting: Urology

## 2017-08-09 DIAGNOSIS — N138 Other obstructive and reflux uropathy: Secondary | ICD-10-CM | POA: Diagnosis not present

## 2017-08-09 DIAGNOSIS — I2510 Atherosclerotic heart disease of native coronary artery without angina pectoris: Secondary | ICD-10-CM | POA: Diagnosis not present

## 2017-08-09 DIAGNOSIS — N183 Chronic kidney disease, stage 3 (moderate): Secondary | ICD-10-CM | POA: Diagnosis not present

## 2017-08-09 DIAGNOSIS — M519 Unspecified thoracic, thoracolumbar and lumbosacral intervertebral disc disorder: Secondary | ICD-10-CM | POA: Diagnosis not present

## 2017-08-09 DIAGNOSIS — M1991 Primary osteoarthritis, unspecified site: Secondary | ICD-10-CM | POA: Diagnosis not present

## 2017-08-09 DIAGNOSIS — I129 Hypertensive chronic kidney disease with stage 1 through stage 4 chronic kidney disease, or unspecified chronic kidney disease: Secondary | ICD-10-CM | POA: Diagnosis not present

## 2017-08-09 DIAGNOSIS — N200 Calculus of kidney: Secondary | ICD-10-CM | POA: Diagnosis not present

## 2017-08-09 DIAGNOSIS — J449 Chronic obstructive pulmonary disease, unspecified: Secondary | ICD-10-CM | POA: Diagnosis not present

## 2017-08-09 DIAGNOSIS — R7303 Prediabetes: Secondary | ICD-10-CM | POA: Diagnosis not present

## 2017-08-09 DIAGNOSIS — N401 Enlarged prostate with lower urinary tract symptoms: Secondary | ICD-10-CM | POA: Diagnosis not present

## 2017-08-11 DIAGNOSIS — N183 Chronic kidney disease, stage 3 (moderate): Secondary | ICD-10-CM | POA: Diagnosis not present

## 2017-08-11 DIAGNOSIS — M1991 Primary osteoarthritis, unspecified site: Secondary | ICD-10-CM | POA: Diagnosis not present

## 2017-08-11 DIAGNOSIS — J449 Chronic obstructive pulmonary disease, unspecified: Secondary | ICD-10-CM | POA: Diagnosis not present

## 2017-08-11 DIAGNOSIS — N200 Calculus of kidney: Secondary | ICD-10-CM | POA: Diagnosis not present

## 2017-08-11 DIAGNOSIS — I2510 Atherosclerotic heart disease of native coronary artery without angina pectoris: Secondary | ICD-10-CM | POA: Diagnosis not present

## 2017-08-11 DIAGNOSIS — N138 Other obstructive and reflux uropathy: Secondary | ICD-10-CM | POA: Diagnosis not present

## 2017-08-11 DIAGNOSIS — M519 Unspecified thoracic, thoracolumbar and lumbosacral intervertebral disc disorder: Secondary | ICD-10-CM | POA: Diagnosis not present

## 2017-08-11 DIAGNOSIS — N401 Enlarged prostate with lower urinary tract symptoms: Secondary | ICD-10-CM | POA: Diagnosis not present

## 2017-08-11 DIAGNOSIS — R7303 Prediabetes: Secondary | ICD-10-CM | POA: Diagnosis not present

## 2017-08-11 DIAGNOSIS — I129 Hypertensive chronic kidney disease with stage 1 through stage 4 chronic kidney disease, or unspecified chronic kidney disease: Secondary | ICD-10-CM | POA: Diagnosis not present

## 2017-08-12 DIAGNOSIS — I129 Hypertensive chronic kidney disease with stage 1 through stage 4 chronic kidney disease, or unspecified chronic kidney disease: Secondary | ICD-10-CM | POA: Diagnosis not present

## 2017-08-12 DIAGNOSIS — R7303 Prediabetes: Secondary | ICD-10-CM | POA: Diagnosis not present

## 2017-08-12 DIAGNOSIS — N138 Other obstructive and reflux uropathy: Secondary | ICD-10-CM | POA: Diagnosis not present

## 2017-08-12 DIAGNOSIS — N200 Calculus of kidney: Secondary | ICD-10-CM | POA: Diagnosis not present

## 2017-08-12 DIAGNOSIS — N183 Chronic kidney disease, stage 3 (moderate): Secondary | ICD-10-CM | POA: Diagnosis not present

## 2017-08-12 DIAGNOSIS — J449 Chronic obstructive pulmonary disease, unspecified: Secondary | ICD-10-CM | POA: Diagnosis not present

## 2017-08-12 DIAGNOSIS — N401 Enlarged prostate with lower urinary tract symptoms: Secondary | ICD-10-CM | POA: Diagnosis not present

## 2017-08-12 DIAGNOSIS — M519 Unspecified thoracic, thoracolumbar and lumbosacral intervertebral disc disorder: Secondary | ICD-10-CM | POA: Diagnosis not present

## 2017-08-12 DIAGNOSIS — M1991 Primary osteoarthritis, unspecified site: Secondary | ICD-10-CM | POA: Diagnosis not present

## 2017-08-12 DIAGNOSIS — I2510 Atherosclerotic heart disease of native coronary artery without angina pectoris: Secondary | ICD-10-CM | POA: Diagnosis not present

## 2017-08-15 DIAGNOSIS — N200 Calculus of kidney: Secondary | ICD-10-CM | POA: Diagnosis not present

## 2017-08-15 DIAGNOSIS — I129 Hypertensive chronic kidney disease with stage 1 through stage 4 chronic kidney disease, or unspecified chronic kidney disease: Secondary | ICD-10-CM | POA: Diagnosis not present

## 2017-08-15 DIAGNOSIS — N183 Chronic kidney disease, stage 3 (moderate): Secondary | ICD-10-CM | POA: Diagnosis not present

## 2017-08-15 DIAGNOSIS — N401 Enlarged prostate with lower urinary tract symptoms: Secondary | ICD-10-CM | POA: Diagnosis not present

## 2017-08-15 DIAGNOSIS — I2510 Atherosclerotic heart disease of native coronary artery without angina pectoris: Secondary | ICD-10-CM | POA: Diagnosis not present

## 2017-08-15 DIAGNOSIS — M1991 Primary osteoarthritis, unspecified site: Secondary | ICD-10-CM | POA: Diagnosis not present

## 2017-08-15 DIAGNOSIS — J449 Chronic obstructive pulmonary disease, unspecified: Secondary | ICD-10-CM | POA: Diagnosis not present

## 2017-08-15 DIAGNOSIS — R7303 Prediabetes: Secondary | ICD-10-CM | POA: Diagnosis not present

## 2017-08-15 DIAGNOSIS — M519 Unspecified thoracic, thoracolumbar and lumbosacral intervertebral disc disorder: Secondary | ICD-10-CM | POA: Diagnosis not present

## 2017-08-15 DIAGNOSIS — N138 Other obstructive and reflux uropathy: Secondary | ICD-10-CM | POA: Diagnosis not present

## 2017-08-17 DIAGNOSIS — M519 Unspecified thoracic, thoracolumbar and lumbosacral intervertebral disc disorder: Secondary | ICD-10-CM | POA: Diagnosis not present

## 2017-08-17 DIAGNOSIS — R7303 Prediabetes: Secondary | ICD-10-CM | POA: Diagnosis not present

## 2017-08-17 DIAGNOSIS — N138 Other obstructive and reflux uropathy: Secondary | ICD-10-CM | POA: Diagnosis not present

## 2017-08-17 DIAGNOSIS — J449 Chronic obstructive pulmonary disease, unspecified: Secondary | ICD-10-CM | POA: Diagnosis not present

## 2017-08-17 DIAGNOSIS — N401 Enlarged prostate with lower urinary tract symptoms: Secondary | ICD-10-CM | POA: Diagnosis not present

## 2017-08-17 DIAGNOSIS — M1991 Primary osteoarthritis, unspecified site: Secondary | ICD-10-CM | POA: Diagnosis not present

## 2017-08-17 DIAGNOSIS — N200 Calculus of kidney: Secondary | ICD-10-CM | POA: Diagnosis not present

## 2017-08-17 DIAGNOSIS — I2510 Atherosclerotic heart disease of native coronary artery without angina pectoris: Secondary | ICD-10-CM | POA: Diagnosis not present

## 2017-08-17 DIAGNOSIS — I129 Hypertensive chronic kidney disease with stage 1 through stage 4 chronic kidney disease, or unspecified chronic kidney disease: Secondary | ICD-10-CM | POA: Diagnosis not present

## 2017-08-17 DIAGNOSIS — N183 Chronic kidney disease, stage 3 (moderate): Secondary | ICD-10-CM | POA: Diagnosis not present

## 2017-08-19 DIAGNOSIS — M519 Unspecified thoracic, thoracolumbar and lumbosacral intervertebral disc disorder: Secondary | ICD-10-CM | POA: Diagnosis not present

## 2017-08-19 DIAGNOSIS — N183 Chronic kidney disease, stage 3 (moderate): Secondary | ICD-10-CM | POA: Diagnosis not present

## 2017-08-19 DIAGNOSIS — J449 Chronic obstructive pulmonary disease, unspecified: Secondary | ICD-10-CM | POA: Diagnosis not present

## 2017-08-19 DIAGNOSIS — N138 Other obstructive and reflux uropathy: Secondary | ICD-10-CM | POA: Diagnosis not present

## 2017-08-19 DIAGNOSIS — M1991 Primary osteoarthritis, unspecified site: Secondary | ICD-10-CM | POA: Diagnosis not present

## 2017-08-19 DIAGNOSIS — I129 Hypertensive chronic kidney disease with stage 1 through stage 4 chronic kidney disease, or unspecified chronic kidney disease: Secondary | ICD-10-CM | POA: Diagnosis not present

## 2017-08-19 DIAGNOSIS — I2510 Atherosclerotic heart disease of native coronary artery without angina pectoris: Secondary | ICD-10-CM | POA: Diagnosis not present

## 2017-08-19 DIAGNOSIS — N401 Enlarged prostate with lower urinary tract symptoms: Secondary | ICD-10-CM | POA: Diagnosis not present

## 2017-08-19 DIAGNOSIS — R7303 Prediabetes: Secondary | ICD-10-CM | POA: Diagnosis not present

## 2017-08-19 DIAGNOSIS — N200 Calculus of kidney: Secondary | ICD-10-CM | POA: Diagnosis not present

## 2017-08-22 DIAGNOSIS — N401 Enlarged prostate with lower urinary tract symptoms: Secondary | ICD-10-CM | POA: Diagnosis not present

## 2017-08-22 DIAGNOSIS — I129 Hypertensive chronic kidney disease with stage 1 through stage 4 chronic kidney disease, or unspecified chronic kidney disease: Secondary | ICD-10-CM | POA: Diagnosis not present

## 2017-08-22 DIAGNOSIS — M519 Unspecified thoracic, thoracolumbar and lumbosacral intervertebral disc disorder: Secondary | ICD-10-CM | POA: Diagnosis not present

## 2017-08-22 DIAGNOSIS — N200 Calculus of kidney: Secondary | ICD-10-CM | POA: Diagnosis not present

## 2017-08-22 DIAGNOSIS — N138 Other obstructive and reflux uropathy: Secondary | ICD-10-CM | POA: Diagnosis not present

## 2017-08-22 DIAGNOSIS — I2510 Atherosclerotic heart disease of native coronary artery without angina pectoris: Secondary | ICD-10-CM | POA: Diagnosis not present

## 2017-08-22 DIAGNOSIS — R7303 Prediabetes: Secondary | ICD-10-CM | POA: Diagnosis not present

## 2017-08-22 DIAGNOSIS — J449 Chronic obstructive pulmonary disease, unspecified: Secondary | ICD-10-CM | POA: Diagnosis not present

## 2017-08-22 DIAGNOSIS — M1991 Primary osteoarthritis, unspecified site: Secondary | ICD-10-CM | POA: Diagnosis not present

## 2017-08-22 DIAGNOSIS — N183 Chronic kidney disease, stage 3 (moderate): Secondary | ICD-10-CM | POA: Diagnosis not present

## 2017-08-23 DIAGNOSIS — R7303 Prediabetes: Secondary | ICD-10-CM | POA: Diagnosis not present

## 2017-08-23 DIAGNOSIS — M1991 Primary osteoarthritis, unspecified site: Secondary | ICD-10-CM | POA: Diagnosis not present

## 2017-08-23 DIAGNOSIS — N138 Other obstructive and reflux uropathy: Secondary | ICD-10-CM | POA: Diagnosis not present

## 2017-08-23 DIAGNOSIS — J449 Chronic obstructive pulmonary disease, unspecified: Secondary | ICD-10-CM | POA: Diagnosis not present

## 2017-08-23 DIAGNOSIS — M519 Unspecified thoracic, thoracolumbar and lumbosacral intervertebral disc disorder: Secondary | ICD-10-CM | POA: Diagnosis not present

## 2017-08-23 DIAGNOSIS — I2510 Atherosclerotic heart disease of native coronary artery without angina pectoris: Secondary | ICD-10-CM | POA: Diagnosis not present

## 2017-08-23 DIAGNOSIS — I129 Hypertensive chronic kidney disease with stage 1 through stage 4 chronic kidney disease, or unspecified chronic kidney disease: Secondary | ICD-10-CM | POA: Diagnosis not present

## 2017-08-23 DIAGNOSIS — N183 Chronic kidney disease, stage 3 (moderate): Secondary | ICD-10-CM | POA: Diagnosis not present

## 2017-08-23 DIAGNOSIS — N401 Enlarged prostate with lower urinary tract symptoms: Secondary | ICD-10-CM | POA: Diagnosis not present

## 2017-08-23 DIAGNOSIS — N200 Calculus of kidney: Secondary | ICD-10-CM | POA: Diagnosis not present

## 2017-08-24 DIAGNOSIS — M1991 Primary osteoarthritis, unspecified site: Secondary | ICD-10-CM | POA: Diagnosis not present

## 2017-08-24 DIAGNOSIS — N200 Calculus of kidney: Secondary | ICD-10-CM | POA: Diagnosis not present

## 2017-08-24 DIAGNOSIS — J449 Chronic obstructive pulmonary disease, unspecified: Secondary | ICD-10-CM | POA: Diagnosis not present

## 2017-08-24 DIAGNOSIS — R7303 Prediabetes: Secondary | ICD-10-CM | POA: Diagnosis not present

## 2017-08-24 DIAGNOSIS — N401 Enlarged prostate with lower urinary tract symptoms: Secondary | ICD-10-CM | POA: Diagnosis not present

## 2017-08-24 DIAGNOSIS — N138 Other obstructive and reflux uropathy: Secondary | ICD-10-CM | POA: Diagnosis not present

## 2017-08-24 DIAGNOSIS — I129 Hypertensive chronic kidney disease with stage 1 through stage 4 chronic kidney disease, or unspecified chronic kidney disease: Secondary | ICD-10-CM | POA: Diagnosis not present

## 2017-08-24 DIAGNOSIS — N183 Chronic kidney disease, stage 3 (moderate): Secondary | ICD-10-CM | POA: Diagnosis not present

## 2017-08-24 DIAGNOSIS — I2510 Atherosclerotic heart disease of native coronary artery without angina pectoris: Secondary | ICD-10-CM | POA: Diagnosis not present

## 2017-08-24 DIAGNOSIS — M519 Unspecified thoracic, thoracolumbar and lumbosacral intervertebral disc disorder: Secondary | ICD-10-CM | POA: Diagnosis not present

## 2017-08-29 DIAGNOSIS — M519 Unspecified thoracic, thoracolumbar and lumbosacral intervertebral disc disorder: Secondary | ICD-10-CM | POA: Diagnosis not present

## 2017-08-29 DIAGNOSIS — I129 Hypertensive chronic kidney disease with stage 1 through stage 4 chronic kidney disease, or unspecified chronic kidney disease: Secondary | ICD-10-CM | POA: Diagnosis not present

## 2017-08-29 DIAGNOSIS — J449 Chronic obstructive pulmonary disease, unspecified: Secondary | ICD-10-CM | POA: Diagnosis not present

## 2017-08-29 DIAGNOSIS — N200 Calculus of kidney: Secondary | ICD-10-CM | POA: Diagnosis not present

## 2017-08-29 DIAGNOSIS — N183 Chronic kidney disease, stage 3 (moderate): Secondary | ICD-10-CM | POA: Diagnosis not present

## 2017-08-29 DIAGNOSIS — N138 Other obstructive and reflux uropathy: Secondary | ICD-10-CM | POA: Diagnosis not present

## 2017-08-29 DIAGNOSIS — M1991 Primary osteoarthritis, unspecified site: Secondary | ICD-10-CM | POA: Diagnosis not present

## 2017-08-29 DIAGNOSIS — I2510 Atherosclerotic heart disease of native coronary artery without angina pectoris: Secondary | ICD-10-CM | POA: Diagnosis not present

## 2017-08-29 DIAGNOSIS — R7303 Prediabetes: Secondary | ICD-10-CM | POA: Diagnosis not present

## 2017-08-29 DIAGNOSIS — N401 Enlarged prostate with lower urinary tract symptoms: Secondary | ICD-10-CM | POA: Diagnosis not present

## 2017-09-01 ENCOUNTER — Telehealth: Payer: Self-pay | Admitting: Family Medicine

## 2017-09-01 DIAGNOSIS — N183 Chronic kidney disease, stage 3 (moderate): Secondary | ICD-10-CM | POA: Diagnosis not present

## 2017-09-01 DIAGNOSIS — I129 Hypertensive chronic kidney disease with stage 1 through stage 4 chronic kidney disease, or unspecified chronic kidney disease: Secondary | ICD-10-CM | POA: Diagnosis not present

## 2017-09-01 DIAGNOSIS — J449 Chronic obstructive pulmonary disease, unspecified: Secondary | ICD-10-CM | POA: Diagnosis not present

## 2017-09-01 DIAGNOSIS — M519 Unspecified thoracic, thoracolumbar and lumbosacral intervertebral disc disorder: Secondary | ICD-10-CM | POA: Diagnosis not present

## 2017-09-01 DIAGNOSIS — R7303 Prediabetes: Secondary | ICD-10-CM | POA: Diagnosis not present

## 2017-09-01 DIAGNOSIS — N401 Enlarged prostate with lower urinary tract symptoms: Secondary | ICD-10-CM | POA: Diagnosis not present

## 2017-09-01 DIAGNOSIS — N138 Other obstructive and reflux uropathy: Secondary | ICD-10-CM | POA: Diagnosis not present

## 2017-09-01 DIAGNOSIS — M1991 Primary osteoarthritis, unspecified site: Secondary | ICD-10-CM | POA: Diagnosis not present

## 2017-09-01 DIAGNOSIS — N200 Calculus of kidney: Secondary | ICD-10-CM | POA: Diagnosis not present

## 2017-09-01 DIAGNOSIS — I2510 Atherosclerotic heart disease of native coronary artery without angina pectoris: Secondary | ICD-10-CM | POA: Diagnosis not present

## 2017-09-01 NOTE — Telephone Encounter (Signed)
Please advise 

## 2017-09-01 NOTE — Telephone Encounter (Signed)
Jose [name] with physical therapy left message on voicemail asking to get a verbal order to extend PT to twice a week for the next 3 weeks. Please advise. Thanks TNP

## 2017-09-02 NOTE — Telephone Encounter (Signed)
L/M stating below.  

## 2017-09-02 NOTE — Telephone Encounter (Signed)
That's fine

## 2017-09-07 DIAGNOSIS — N401 Enlarged prostate with lower urinary tract symptoms: Secondary | ICD-10-CM | POA: Diagnosis not present

## 2017-09-07 DIAGNOSIS — N138 Other obstructive and reflux uropathy: Secondary | ICD-10-CM | POA: Diagnosis not present

## 2017-09-07 DIAGNOSIS — N200 Calculus of kidney: Secondary | ICD-10-CM | POA: Diagnosis not present

## 2017-09-07 DIAGNOSIS — N183 Chronic kidney disease, stage 3 (moderate): Secondary | ICD-10-CM | POA: Diagnosis not present

## 2017-09-07 DIAGNOSIS — M519 Unspecified thoracic, thoracolumbar and lumbosacral intervertebral disc disorder: Secondary | ICD-10-CM | POA: Diagnosis not present

## 2017-09-07 DIAGNOSIS — I129 Hypertensive chronic kidney disease with stage 1 through stage 4 chronic kidney disease, or unspecified chronic kidney disease: Secondary | ICD-10-CM | POA: Diagnosis not present

## 2017-09-07 DIAGNOSIS — M1991 Primary osteoarthritis, unspecified site: Secondary | ICD-10-CM | POA: Diagnosis not present

## 2017-09-07 DIAGNOSIS — J449 Chronic obstructive pulmonary disease, unspecified: Secondary | ICD-10-CM | POA: Diagnosis not present

## 2017-09-07 DIAGNOSIS — R7303 Prediabetes: Secondary | ICD-10-CM | POA: Diagnosis not present

## 2017-09-07 DIAGNOSIS — I2510 Atherosclerotic heart disease of native coronary artery without angina pectoris: Secondary | ICD-10-CM | POA: Diagnosis not present

## 2017-09-08 ENCOUNTER — Other Ambulatory Visit: Payer: Self-pay | Admitting: Family Medicine

## 2017-09-08 DIAGNOSIS — J449 Chronic obstructive pulmonary disease, unspecified: Secondary | ICD-10-CM | POA: Diagnosis not present

## 2017-09-08 DIAGNOSIS — M1991 Primary osteoarthritis, unspecified site: Secondary | ICD-10-CM | POA: Diagnosis not present

## 2017-09-08 DIAGNOSIS — I129 Hypertensive chronic kidney disease with stage 1 through stage 4 chronic kidney disease, or unspecified chronic kidney disease: Secondary | ICD-10-CM | POA: Diagnosis not present

## 2017-09-08 DIAGNOSIS — N200 Calculus of kidney: Secondary | ICD-10-CM | POA: Diagnosis not present

## 2017-09-08 DIAGNOSIS — N138 Other obstructive and reflux uropathy: Secondary | ICD-10-CM | POA: Diagnosis not present

## 2017-09-08 DIAGNOSIS — R7303 Prediabetes: Secondary | ICD-10-CM | POA: Diagnosis not present

## 2017-09-08 DIAGNOSIS — N401 Enlarged prostate with lower urinary tract symptoms: Secondary | ICD-10-CM | POA: Diagnosis not present

## 2017-09-08 DIAGNOSIS — N183 Chronic kidney disease, stage 3 (moderate): Secondary | ICD-10-CM | POA: Diagnosis not present

## 2017-09-08 DIAGNOSIS — M519 Unspecified thoracic, thoracolumbar and lumbosacral intervertebral disc disorder: Secondary | ICD-10-CM | POA: Diagnosis not present

## 2017-09-08 DIAGNOSIS — I2510 Atherosclerotic heart disease of native coronary artery without angina pectoris: Secondary | ICD-10-CM | POA: Diagnosis not present

## 2017-09-08 NOTE — Telephone Encounter (Signed)
Pt called saying he needs to get his Tamulosin refilled.  He said the pharmacy said it was denied.  [facility] [address]  Thanks teri

## 2017-09-08 NOTE — Telephone Encounter (Signed)
Called patient to verify the pharmacy. We sent [facility]osin into [facility] not [facility]. This may be the issue. Patient did not answer will try again later.

## 2017-09-12 DIAGNOSIS — N200 Calculus of kidney: Secondary | ICD-10-CM | POA: Diagnosis not present

## 2017-09-12 DIAGNOSIS — N183 Chronic kidney disease, stage 3 (moderate): Secondary | ICD-10-CM | POA: Diagnosis not present

## 2017-09-12 DIAGNOSIS — I129 Hypertensive chronic kidney disease with stage 1 through stage 4 chronic kidney disease, or unspecified chronic kidney disease: Secondary | ICD-10-CM | POA: Diagnosis not present

## 2017-09-12 DIAGNOSIS — M519 Unspecified thoracic, thoracolumbar and lumbosacral intervertebral disc disorder: Secondary | ICD-10-CM | POA: Diagnosis not present

## 2017-09-12 DIAGNOSIS — N138 Other obstructive and reflux uropathy: Secondary | ICD-10-CM | POA: Diagnosis not present

## 2017-09-12 DIAGNOSIS — J449 Chronic obstructive pulmonary disease, unspecified: Secondary | ICD-10-CM | POA: Diagnosis not present

## 2017-09-12 DIAGNOSIS — I2510 Atherosclerotic heart disease of native coronary artery without angina pectoris: Secondary | ICD-10-CM | POA: Diagnosis not present

## 2017-09-12 DIAGNOSIS — N401 Enlarged prostate with lower urinary tract symptoms: Secondary | ICD-10-CM | POA: Diagnosis not present

## 2017-09-12 DIAGNOSIS — M1991 Primary osteoarthritis, unspecified site: Secondary | ICD-10-CM | POA: Diagnosis not present

## 2017-09-12 DIAGNOSIS — R7303 Prediabetes: Secondary | ICD-10-CM | POA: Diagnosis not present

## 2017-09-13 NOTE — Telephone Encounter (Signed)
Lmtcb, is there still an issue with Flomax, need to find out. -Consuella Lose, RMA

## 2017-09-14 DIAGNOSIS — M1991 Primary osteoarthritis, unspecified site: Secondary | ICD-10-CM | POA: Diagnosis not present

## 2017-09-14 DIAGNOSIS — N138 Other obstructive and reflux uropathy: Secondary | ICD-10-CM | POA: Diagnosis not present

## 2017-09-14 DIAGNOSIS — N200 Calculus of kidney: Secondary | ICD-10-CM | POA: Diagnosis not present

## 2017-09-14 DIAGNOSIS — I2510 Atherosclerotic heart disease of native coronary artery without angina pectoris: Secondary | ICD-10-CM | POA: Diagnosis not present

## 2017-09-14 DIAGNOSIS — M519 Unspecified thoracic, thoracolumbar and lumbosacral intervertebral disc disorder: Secondary | ICD-10-CM | POA: Diagnosis not present

## 2017-09-14 DIAGNOSIS — N401 Enlarged prostate with lower urinary tract symptoms: Secondary | ICD-10-CM | POA: Diagnosis not present

## 2017-09-14 DIAGNOSIS — N183 Chronic kidney disease, stage 3 (moderate): Secondary | ICD-10-CM | POA: Diagnosis not present

## 2017-09-14 DIAGNOSIS — J449 Chronic obstructive pulmonary disease, unspecified: Secondary | ICD-10-CM | POA: Diagnosis not present

## 2017-09-14 DIAGNOSIS — R7303 Prediabetes: Secondary | ICD-10-CM | POA: Diagnosis not present

## 2017-09-14 DIAGNOSIS — I129 Hypertensive chronic kidney disease with stage 1 through stage 4 chronic kidney disease, or unspecified chronic kidney disease: Secondary | ICD-10-CM | POA: Diagnosis not present

## 2017-09-15 MED ORDER — TAMSULOSIN HCL 0.4 MG PO CAPS: 0.4000 mg | ORAL_CAPSULE | Freq: Every day | ORAL | 5 refills | Status: AC

## 2017-09-15 NOTE — Telephone Encounter (Signed)
Patient is requesting a refill of this medication. It looks like a different doctor prescribed this last on 07/26/2017. Patient states he doesn't recall who that doctor is and wants to see if Dr. Sherrie Mustache will refill it.

## 2017-09-19 DIAGNOSIS — H34832 Tributary (branch) retinal vein occlusion, left eye, with macular edema: Secondary | ICD-10-CM | POA: Diagnosis not present

## 2017-09-19 DIAGNOSIS — H348312 Tributary (branch) retinal vein occlusion, right eye, stable: Secondary | ICD-10-CM | POA: Diagnosis not present

## 2017-09-20 DIAGNOSIS — N200 Calculus of kidney: Secondary | ICD-10-CM | POA: Diagnosis not present

## 2017-09-20 DIAGNOSIS — R7303 Prediabetes: Secondary | ICD-10-CM | POA: Diagnosis not present

## 2017-09-20 DIAGNOSIS — I2510 Atherosclerotic heart disease of native coronary artery without angina pectoris: Secondary | ICD-10-CM | POA: Diagnosis not present

## 2017-09-20 DIAGNOSIS — N401 Enlarged prostate with lower urinary tract symptoms: Secondary | ICD-10-CM | POA: Diagnosis not present

## 2017-09-20 DIAGNOSIS — I129 Hypertensive chronic kidney disease with stage 1 through stage 4 chronic kidney disease, or unspecified chronic kidney disease: Secondary | ICD-10-CM | POA: Diagnosis not present

## 2017-09-20 DIAGNOSIS — J449 Chronic obstructive pulmonary disease, unspecified: Secondary | ICD-10-CM | POA: Diagnosis not present

## 2017-09-20 DIAGNOSIS — N138 Other obstructive and reflux uropathy: Secondary | ICD-10-CM | POA: Diagnosis not present

## 2017-09-20 DIAGNOSIS — M1991 Primary osteoarthritis, unspecified site: Secondary | ICD-10-CM | POA: Diagnosis not present

## 2017-09-20 DIAGNOSIS — N183 Chronic kidney disease, stage 3 (moderate): Secondary | ICD-10-CM | POA: Diagnosis not present

## 2017-09-20 DIAGNOSIS — M519 Unspecified thoracic, thoracolumbar and lumbosacral intervertebral disc disorder: Secondary | ICD-10-CM | POA: Diagnosis not present

## 2017-09-21 DIAGNOSIS — M519 Unspecified thoracic, thoracolumbar and lumbosacral intervertebral disc disorder: Secondary | ICD-10-CM | POA: Diagnosis not present

## 2017-09-21 DIAGNOSIS — I2510 Atherosclerotic heart disease of native coronary artery without angina pectoris: Secondary | ICD-10-CM | POA: Diagnosis not present

## 2017-09-21 DIAGNOSIS — M1991 Primary osteoarthritis, unspecified site: Secondary | ICD-10-CM | POA: Diagnosis not present

## 2017-09-21 DIAGNOSIS — N138 Other obstructive and reflux uropathy: Secondary | ICD-10-CM | POA: Diagnosis not present

## 2017-09-21 DIAGNOSIS — R7303 Prediabetes: Secondary | ICD-10-CM | POA: Diagnosis not present

## 2017-09-21 DIAGNOSIS — N183 Chronic kidney disease, stage 3 (moderate): Secondary | ICD-10-CM | POA: Diagnosis not present

## 2017-09-21 DIAGNOSIS — N200 Calculus of kidney: Secondary | ICD-10-CM | POA: Diagnosis not present

## 2017-09-21 DIAGNOSIS — N401 Enlarged prostate with lower urinary tract symptoms: Secondary | ICD-10-CM | POA: Diagnosis not present

## 2017-09-21 DIAGNOSIS — I129 Hypertensive chronic kidney disease with stage 1 through stage 4 chronic kidney disease, or unspecified chronic kidney disease: Secondary | ICD-10-CM | POA: Diagnosis not present

## 2017-09-21 DIAGNOSIS — J449 Chronic obstructive pulmonary disease, unspecified: Secondary | ICD-10-CM | POA: Diagnosis not present

## 2017-10-09 ENCOUNTER — Emergency Department: Payer: Medicare HMO

## 2017-10-09 ENCOUNTER — Inpatient Hospital Stay
Admission: EM | Admit: 2017-10-09 | Discharge: 2017-10-13 | DRG: 308 | Disposition: A | Discharge status: Home Health | Payer: Medicare HMO | Attending: Internal Medicine | Admitting: Internal Medicine

## 2017-10-09 DIAGNOSIS — J449 Chronic obstructive pulmonary disease, unspecified: Secondary | ICD-10-CM | POA: Diagnosis not present

## 2017-10-09 DIAGNOSIS — S60512A Abrasion of left hand, initial encounter: Secondary | ICD-10-CM

## 2017-10-09 DIAGNOSIS — Z888 Allergy status to other drugs, medicaments and biological substances status: Secondary | ICD-10-CM | POA: Diagnosis not present

## 2017-10-09 DIAGNOSIS — I4891 Unspecified atrial fibrillation: Secondary | ICD-10-CM | POA: Diagnosis present

## 2017-10-09 DIAGNOSIS — Z79899 Other long term (current) drug therapy: Secondary | ICD-10-CM

## 2017-10-09 DIAGNOSIS — I10 Essential (primary) hypertension: Secondary | ICD-10-CM | POA: Diagnosis present

## 2017-10-09 DIAGNOSIS — S12000A Unspecified displaced fracture of first cervical vertebra, initial encounter for closed fracture: Secondary | ICD-10-CM | POA: Diagnosis not present

## 2017-10-09 DIAGNOSIS — E785 Hyperlipidemia, unspecified: Secondary | ICD-10-CM | POA: Diagnosis present

## 2017-10-09 DIAGNOSIS — G4733 Obstructive sleep apnea (adult) (pediatric): Secondary | ICD-10-CM | POA: Diagnosis not present

## 2017-10-09 DIAGNOSIS — Z9841 Cataract extraction status, right eye: Secondary | ICD-10-CM | POA: Diagnosis not present

## 2017-10-09 DIAGNOSIS — R000 Tachycardia, unspecified: Secondary | ICD-10-CM | POA: Diagnosis not present

## 2017-10-09 DIAGNOSIS — I2510 Atherosclerotic heart disease of native coronary artery without angina pectoris: Secondary | ICD-10-CM | POA: Diagnosis present

## 2017-10-09 DIAGNOSIS — W0110XA Fall on same level from slipping, tripping and stumbling with subsequent striking against unspecified object, initial encounter: Secondary | ICD-10-CM | POA: Diagnosis present

## 2017-10-09 DIAGNOSIS — Y92009 Unspecified place in unspecified non-institutional (private) residence as the place of occurrence of the external cause: Secondary | ICD-10-CM

## 2017-10-09 DIAGNOSIS — S12120A Other displaced dens fracture, initial encounter for closed fracture: Secondary | ICD-10-CM | POA: Diagnosis not present

## 2017-10-09 DIAGNOSIS — I130 Hypertensive heart and chronic kidney disease with heart failure and stage 1 through stage 4 chronic kidney disease, or unspecified chronic kidney disease: Secondary | ICD-10-CM | POA: Diagnosis present

## 2017-10-09 DIAGNOSIS — Z66 Do not resuscitate: Secondary | ICD-10-CM | POA: Diagnosis present

## 2017-10-09 DIAGNOSIS — I5021 Acute systolic (congestive) heart failure: Secondary | ICD-10-CM | POA: Diagnosis not present

## 2017-10-09 DIAGNOSIS — Z955 Presence of coronary angioplasty implant and graft: Secondary | ICD-10-CM

## 2017-10-09 DIAGNOSIS — S0003XA Contusion of scalp, initial encounter: Secondary | ICD-10-CM | POA: Diagnosis present

## 2017-10-09 DIAGNOSIS — Z87891 Personal history of nicotine dependence: Secondary | ICD-10-CM | POA: Diagnosis not present

## 2017-10-09 DIAGNOSIS — S12110A Anterior displaced Type II dens fracture, initial encounter for closed fracture: Secondary | ICD-10-CM | POA: Diagnosis present

## 2017-10-09 DIAGNOSIS — I110 Hypertensive heart disease with heart failure: Secondary | ICD-10-CM | POA: Diagnosis not present

## 2017-10-09 DIAGNOSIS — N183 Chronic kidney disease, stage 3 unspecified: Secondary | ICD-10-CM | POA: Diagnosis present

## 2017-10-09 DIAGNOSIS — Z9842 Cataract extraction status, left eye: Secondary | ICD-10-CM | POA: Diagnosis not present

## 2017-10-09 DIAGNOSIS — R296 Repeated falls: Secondary | ICD-10-CM | POA: Diagnosis not present

## 2017-10-09 DIAGNOSIS — S12100A Unspecified displaced fracture of second cervical vertebra, initial encounter for closed fracture: Secondary | ICD-10-CM | POA: Diagnosis not present

## 2017-10-09 DIAGNOSIS — R0602 Shortness of breath: Secondary | ICD-10-CM | POA: Diagnosis present

## 2017-10-09 DIAGNOSIS — Z23 Encounter for immunization: Secondary | ICD-10-CM | POA: Diagnosis present

## 2017-10-09 DIAGNOSIS — Z8249 Family history of ischemic heart disease and other diseases of the circulatory system: Secondary | ICD-10-CM | POA: Diagnosis not present

## 2017-10-09 DIAGNOSIS — I509 Heart failure, unspecified: Secondary | ICD-10-CM

## 2017-10-09 DIAGNOSIS — E8770 Fluid overload, unspecified: Secondary | ICD-10-CM

## 2017-10-09 DIAGNOSIS — S12101A Unspecified nondisplaced fracture of second cervical vertebra, initial encounter for closed fracture: Secondary | ICD-10-CM

## 2017-10-09 DIAGNOSIS — N400 Enlarged prostate without lower urinary tract symptoms: Secondary | ICD-10-CM | POA: Diagnosis present

## 2017-10-09 DIAGNOSIS — I480 Paroxysmal atrial fibrillation: Secondary | ICD-10-CM | POA: Diagnosis not present

## 2017-10-09 DIAGNOSIS — W19XXXA Unspecified fall, initial encounter: Secondary | ICD-10-CM

## 2017-10-09 LAB — BASIC METABOLIC PANEL
Anion gap: 9 (ref 5–15)
BUN: 30 mg/dL — AB (ref 6–20)
CHLORIDE: 106 mmol/L (ref 101–111)
CO2: 25 mmol/L (ref 22–32)
Calcium: 8.7 mg/dL — ABNORMAL LOW (ref 8.9–10.3)
Creatinine, Ser: 1.42 mg/dL — ABNORMAL HIGH (ref 0.61–1.24)
GFR calc Af Amer: 50 mL/min — ABNORMAL LOW (ref >60)
GFR calc non Af Amer: 43 mL/min — ABNORMAL LOW (ref >60)
GLUCOSE: 124 mg/dL — AB (ref 65–99)
POTASSIUM: 3.8 mmol/L (ref 3.5–5.1)
Sodium: 140 mmol/L (ref 135–145)

## 2017-10-09 LAB — URINALYSIS, COMPLETE (UACMP) WITH MICROSCOPIC
Bacteria, UA: NONE SEEN
Bilirubin Urine: NEGATIVE
GLUCOSE, UA: NEGATIVE mg/dL
Hgb urine dipstick: NEGATIVE
Ketones, ur: NEGATIVE mg/dL
Leukocytes, UA: NEGATIVE
Nitrite: NEGATIVE
PH: 5 (ref 5.0–8.0)
Protein, ur: 30 mg/dL — AB
SPECIFIC GRAVITY, URINE: 1.013 (ref 1.005–1.030)

## 2017-10-09 LAB — CBC
HCT: 42.3 % (ref 40.0–52.0)
HEMOGLOBIN: 13.9 g/dL (ref 13.0–18.0)
MCH: 30 pg (ref 26.0–34.0)
MCHC: 32.8 g/dL (ref 32.0–36.0)
MCV: 91.5 fL (ref 80.0–100.0)
PLATELETS: 196 10*3/uL (ref 150–440)
RBC: 4.63 MIL/uL (ref 4.40–5.90)
RDW: 14.1 % (ref 11.5–14.5)
WBC: 9.3 10*3/uL (ref 3.8–10.6)

## 2017-10-09 LAB — BRAIN NATRIURETIC PEPTIDE: B Natriuretic Peptide: 408 pg/mL — ABNORMAL HIGH (ref 0.0–100.0)

## 2017-10-09 MED ORDER — HEPARIN SODIUM (PORCINE) 5000 UNIT/ML IJ SOLN
5000.0000 [IU] | Freq: Two times a day (BID) | INTRAMUSCULAR | Status: DC
  Administered 2017-10-09: 5000 [IU] via SUBCUTANEOUS
  Filled 2017-10-09: qty 1

## 2017-10-09 MED ORDER — FENTANYL CITRATE (PF) 100 MCG/2ML IJ SOLN
INTRAMUSCULAR | Status: AC
  Administered 2017-10-09: 12.5 ug via INTRAVENOUS
  Filled 2017-10-09: qty 2

## 2017-10-09 MED ORDER — BACITRACIN ZINC 500 UNIT/GM EX OINT
TOPICAL_OINTMENT | Freq: Once | CUTANEOUS | Status: AC
Start: 2017-10-09 — End: 2017-10-09
  Administered 2017-10-09: 1 via TOPICAL

## 2017-10-09 MED ORDER — METOPROLOL TARTRATE 5 MG/5ML IV SOLN
2.5000 mg | Freq: Once | INTRAVENOUS | Status: AC
  Administered 2017-10-09: 2.5 mg via INTRAVENOUS
  Filled 2017-10-09: qty 5

## 2017-10-09 MED ORDER — BACITRACIN ZINC 500 UNIT/GM EX OINT
TOPICAL_OINTMENT | CUTANEOUS | Status: AC
  Administered 2017-10-09: 1 via TOPICAL
  Filled 2017-10-09: qty 0.9

## 2017-10-09 MED ORDER — METOPROLOL TARTRATE 5 MG/5ML IV SOLN
5.0000 mg | INTRAVENOUS | Status: DC | PRN
  Administered 2017-10-10: 5 mg via INTRAVENOUS
  Filled 2017-10-09: qty 5

## 2017-10-09 MED ORDER — ONDANSETRON HCL 4 MG PO TABS: 4.0000 mg | ORAL_TABLET | Freq: Four times a day (QID) | ORAL | Status: DC | PRN

## 2017-10-09 MED ORDER — FENTANYL CITRATE (PF) 100 MCG/2ML IJ SOLN
12.5000 ug | Freq: Once | INTRAMUSCULAR | Status: AC
  Administered 2017-10-09: 12.5 ug via INTRAVENOUS

## 2017-10-09 MED ORDER — METHYLPREDNISOLONE SODIUM SUCC 40 MG IJ SOLR
40.0000 mg | Freq: Two times a day (BID) | INTRAMUSCULAR | Status: DC
  Administered 2017-10-09: 40 mg via INTRAVENOUS
  Filled 2017-10-09: qty 1

## 2017-10-09 MED ORDER — METOPROLOL TARTRATE 5 MG/5ML IV SOLN
5.0000 mg | Freq: Once | INTRAVENOUS | Status: AC
  Administered 2017-10-09: 5 mg via INTRAVENOUS
  Filled 2017-10-09: qty 5

## 2017-10-09 MED ORDER — ACETAMINOPHEN 325 MG PO TABS
650.0000 mg | ORAL_TABLET | Freq: Four times a day (QID) | ORAL | Status: DC | PRN
Start: 2017-10-09 — End: 2017-10-13
  Administered 2017-10-09 – 2017-10-13 (×3): 650 mg via ORAL
  Filled 2017-10-09 (×4): qty 2

## 2017-10-09 MED ORDER — CARVEDILOL 12.5 MG PO TABS: 12.5000 mg | ORAL_TABLET | Freq: Two times a day (BID) | ORAL | Status: DC

## 2017-10-09 MED ORDER — INFLUENZA VAC SPLIT HIGH-DOSE 0.5 ML IM SUSY
0.5000 mL | PREFILLED_SYRINGE | INTRAMUSCULAR | Status: AC
  Administered 2017-10-10: 0.5 mL via INTRAMUSCULAR
  Filled 2017-10-09 (×2): qty 0.5

## 2017-10-09 MED ORDER — FUROSEMIDE 10 MG/ML IJ SOLN
20.0000 mg | Freq: Two times a day (BID) | INTRAMUSCULAR | Status: DC
  Administered 2017-10-10 – 2017-10-13 (×7): 20 mg via INTRAVENOUS
  Filled 2017-10-09 (×7): qty 2

## 2017-10-09 MED ORDER — ONDANSETRON HCL 4 MG/2ML IJ SOLN: 4.0000 mg | Freq: Four times a day (QID) | INTRAMUSCULAR | Status: DC | PRN

## 2017-10-09 MED ORDER — IPRATROPIUM-ALBUTEROL 0.5-2.5 (3) MG/3ML IN SOLN: 3.0000 mL | RESPIRATORY_TRACT | Status: DC | PRN

## 2017-10-09 MED ORDER — METOPROLOL TARTRATE 5 MG/5ML IV SOLN
5.0000 mg | Freq: Once | INTRAVENOUS | Status: AC
  Administered 2017-10-09: 5 mg via INTRAVENOUS

## 2017-10-09 MED ORDER — CARVEDILOL 6.25 MG PO TABS
12.5000 mg | ORAL_TABLET | Freq: Once | ORAL | Status: AC
  Administered 2017-10-09: 12.5 mg via ORAL
  Filled 2017-10-09: qty 2

## 2017-10-09 MED ORDER — METOPROLOL TARTRATE 5 MG/5ML IV SOLN
INTRAVENOUS | Status: AC
  Administered 2017-10-09: 5 mg via INTRAVENOUS
  Filled 2017-10-09: qty 5

## 2017-10-09 MED ORDER — SIMVASTATIN 20 MG PO TABS
40.0000 mg | ORAL_TABLET | Freq: Every day | ORAL | Status: DC
  Administered 2017-10-09 – 2017-10-11 (×3): 40 mg via ORAL
  Filled 2017-10-09 (×3): qty 2

## 2017-10-09 MED ORDER — ACETAMINOPHEN 650 MG RE SUPP: 650.0000 mg | Freq: Four times a day (QID) | RECTAL | Status: DC | PRN

## 2017-10-09 MED ORDER — FUROSEMIDE 10 MG/ML IJ SOLN
20.0000 mg | Freq: Once | INTRAMUSCULAR | Status: AC
  Administered 2017-10-09: 20 mg via INTRAVENOUS
  Filled 2017-10-09: qty 4

## 2017-10-09 MED ORDER — TAMSULOSIN HCL 0.4 MG PO CAPS
0.4000 mg | ORAL_CAPSULE | Freq: Every day | ORAL | Status: DC
  Administered 2017-10-10 – 2017-10-13 (×4): 0.4 mg via ORAL
  Filled 2017-10-09 (×4): qty 1

## 2017-10-09 MED ORDER — POLYETHYLENE GLYCOL 3350 17 G PO PACK: 17.0000 g | PACK | Freq: Every day | ORAL | Status: DC | PRN

## 2017-10-09 NOTE — ED Triage Notes (Signed)
PT to triage reporting increased SOB over the past couple days with cough and congestion. PT reports increased weakness and increased falls recently. PT  Uses a cane but reports today he because lightheaded while ambulating and fell. PT has laceration noted to left forehead, pain in neck and pain in both knees. Pt is alert and oriented x 4. No increased WOB noted while pt is sitting in wheelchair.

## 2017-10-09 NOTE — ED Notes (Signed)
See triage note. Pt present to ED s/p fall with head involvement. Hematoma and abrasion noted to L forehead. Pt reports pain to post neck and bil knees. Pt's family states pt tripped while stepping onto concrete sidewalk. Pt noted to be in afib on cardiac monitor, denies prior history of such. EKG completed.

## 2017-10-09 NOTE — ED Notes (Signed)
Pt transported to 2A by this nurse. Report given at bedside to [facility].

## 2017-10-09 NOTE — ED Notes (Signed)
Patient transported to X-ray 

## 2017-10-09 NOTE — ED Provider Notes (Addendum)
[facility] Emergency Department Provider Note ____________________________________________   First MD Initiated Contact with Patient 10/09/17 1501     (approximate)  I have reviewed the triage vital signs and the nursing notes.  HISTORY  Chief Complaint Shortness of Breath and Fall   HPI Jose [name]. is a 83 y.o. male history of chronic kidney disease, sleep apnea  patient presents for evaluation after feeling short of breath for about 2 days, and then while walking out of his home today he tripped and fell sustaining a bruise and "goose egg" his left forehead. His son brought him in the ER for evaluation. Patient reports he does feel like his neck is sore in the back, it feels bruised. Also reports a mild left-sided headache where it is swollen over his forehead. No vision changes. No chest pain. Does report shortness of breath worse with exertion for the last 2 days, seems to be getting slightly worse     Past Medical History:  Diagnosis Date  . Allergy   . BPH (benign prostatic hyperplasia)   . Chronic kidney disease   . DDD (degenerative disc disease), lumbar   . Depression   . Diverticulosis   . Elevated PSA   . Gross hematuria   . Gynecomastia   . Headache   . History of kidney stones   . HTN (hypertension)   . Incomplete bladder emptying   . Nodular prostate with urinary obstruction   . Organic impotence   . OSA (obstructive sleep apnea)   . Renal colic   . Rosacea   . Sciatica   . Urinary frequency     Patient Active Problem List   Diagnosis Date Noted  . SOB (shortness of breath) 10/09/2017  . Fall at home, initial encounter 10/09/2017  . Atrial fibrillation with RVR (HCC) 10/09/2017  . Odontoid fracture (HCC) 10/09/2017  . Hematuria 07/25/2017  . Sepsis (HCC) 04/30/2017  . Hydronephrosis with urinary obstruction due to ureteral calculus 05/16/2016  . Kidney stones 04/14/2016  . Flank pain 04/14/2016  . BPH with  obstruction/lower urinary tract symptoms 04/14/2016  . Erectile dysfunction of organic origin 04/14/2016  . Arthritis, degenerative 02/20/2016  . LV dysfunction 02/20/2016  . Allergic rhinitis 02/13/2016  . COPD (chronic obstructive pulmonary disease) (HCC) 02/13/2016  . Nephrolithiasis 02/13/2016  . GERD (gastroesophageal reflux disease) 02/13/2016  . Diverticulosis of colon 02/13/2016  . Unspecified visual disturbance 02/13/2016  . Constipation 02/13/2016  . Chronic kidney disease (CKD), stage III (moderate) (HCC) 02/13/2016  . Memory loss 02/13/2016  . Chronic headache disorder 02/13/2016  . Pre-diabetes 02/13/2016  . BPH (benign prostatic hyperplasia) 02/13/2016  . Urinary incontinence without sensory awareness 11/22/2012  . Nodular prostate 11/22/2012  . Incomplete bladder emptying 11/22/2012  . Elevated prostate specific antigen (PSA) 11/22/2012  . Calculus of kidney 11/22/2012  . Obstructive sleep apnea 10/06/2011  . Lumbar disc disease 01/28/2010  . Spondylosis 01/28/2010  . HLD (hyperlipidemia) 05/25/2006  . Essential (primary) hypertension 05/25/2006  . CAD (coronary artery disease) 05/21/2000    Past Surgical History:  Procedure Laterality Date  . ANGIOPLASTY  2001   PTCA and stenting of LAD by Dr. Juliann Pares  . CATARACT EXTRACTION Bilateral 07/2006  . CT Scan of head  12/01/2004   Normal  . DOPPLER ECHOCARDIOGRAPHY  03/19/2013   Normal; Moderate global LV dysfunction. EF=45%. Mild Aortic insufficiency  . Double Ureter stent placement  08/1999   Double J Ureter stent placement  . EXTRACORPOREAL SHOCK  WAVE LITHOTRIPSY Right 04/29/2016   Procedure: EXTRACORPOREAL SHOCK WAVE LITHOTRIPSY (ESWL);  Surgeon: Vanna Scotland, MD;  Location: [facility] ORS;  Service: Urology;  Laterality: Right;  . MRI CERVICAL SPINE WO CONTRAST ([facility] HX)  01/28/2010   Abnormal Results; Arhritis and bulging discs. Referral to Neurosurgery  . MRI CERVICAL SPINE WO CONTRAST ([facility] HX)  10/19/2005    Abnormal, Bone spurs  . Sleep study  10/06/2011   Severe sleep apnea. AHI= 35.4 per hr. Done at [facility]  . SPIROMETRY  09/11/2007   Moderately Severe obstruction  . TRANSURETHRAL RESECTION OF PROSTATE      Prior to Admission medications   Medication Sig Start Date End Date Taking? Authorizing Provider  amLODipine (NORVASC) 5 MG tablet Take 1 tablet (5 mg total) by mouth daily. 07/26/17 07/26/18 Yes Enid Baas, MD  bisoprolol-hydrochlorothiazide [facility]) 5-6.25 MG tablet take 1 tablet by mouth once daily 08/02/17  Yes Fisher, Demetrios Isaacs, MD  finasteride (PROSCAR) 5 MG tablet Take 1 tablet (5 mg total) by mouth daily. 07/26/17  Yes Enid Baas, MD  simvastatin (ZOCOR) 40 MG tablet Take 1 tablet by mouth at bedtime.  01/21/16  Yes [provider]  tamsulosin (FLOMAX) 0.4 MG CAPS capsule Take 1 capsule (0.4 mg total) by mouth daily. 09/15/17  Yes Malva Limes, MD    Allergies Finasteride  Family History  Problem Relation Age of Onset  . Heart disease Father   . Diabetes Father        type 2  . Hyperlipidemia Father   . Stroke Father   . Kidney disease Father   . Hypertension Other   . Breast cancer Other   . Lung cancer Other   . Alcohol abuse Paternal Uncle   . Cancer Sister   . Cancer Son   . Prostate cancer Neg Hx     Social History Social History  Substance Use Topics  . Smoking status: Former Smoker    Packs/day: 3.00    Years: 25.00    Types: Cigarettes    Quit date: 12/27/1978  . Smokeless tobacco: Never Used  . Alcohol use No    Review of Systems Constitutional: No fever/chills Eyes: No visual changes. ENT: No sore throat. Cardiovascular: Denies chest pain. Respiratory: shortness breath over last 2 days, seems to be slightly worse. More so when laying down or walking. No cough. Some slight nasal congestion and mucus in the throat over the last few days Gastrointestinal: No abdominal pain.  No nausea, no vomiting.  No diarrhea.  No  constipation. Genitourinary: Negative for dysuria. Musculoskeletal: Negative for back pain.reports slight bruising over the fingers of the left hand after falling but denies that the hand is painful or having any difficulty with use. Skin: Negative for rash. Neurological: Negative for headaches, focal weakness or numbness.    ____________________________________________   PHYSICAL EXAM:  VITAL SIGNS: ED Triage Vitals  Enc Vitals Group     BP 10/09/17 1444 (!) 121/92     Pulse Rate 10/09/17 1444 82     Resp 10/09/17 1444 20     Temp 10/09/17 1444 98.1 F (36.7 C)     Temp Source 10/09/17 1444 Oral     SpO2 10/09/17 1444 96 %     Weight 10/09/17 1445 193 lb (87.5 kg)     Height 10/09/17 1445  (1.753 m)     Head Circumference --      Peak Flow --      Pain Score 10/09/17  1444 4     Pain Loc --      Pain Edu? --      Excl. in GC? --     Constitutional: Alert and oriented. Well appearing and in no acute distress. Eyes: Conjunctivae are normal. Head: moderate-sized left frontal hematoma noted. No laceration. There is a abrasion associated Nose: No congestion/rhinnorhea. Mouth/Throat: Mucous membranes are moist. Neck: No stridor.  cervical collar placed. Patient reports mild tenderness in the mid cervical spine without deformity or step-off Cardiovascular: irregular rate, irregular rhythm. Grossly normal heart sounds.  Good peripheral circulation. Respiratory: Normal respiratory effort.  No retractions. Lungs CTAB. Gastrointestinal: Soft and nontender. No distention. Musculoskeletal: No lower extremity tendernesswith 3+ pitting edema in the lower extremities bilateral Neurologic:  Normal speech and language. No gross focal neurologic deficits are appreciated.  Skin:  Skin is warm, dry and intact. No rash noted. Psychiatric: Mood and affect are normal. Speech and behavior are normal.  ____________________________________________   LABS (all labs ordered are listed, but  only abnormal results are displayed)  Labs Reviewed  BASIC METABOLIC PANEL - Abnormal; Notable for the following:       Result Value   Glucose, Bld 124 (*)    BUN 30 (*)    Creatinine, Ser 1.42 (*)    Calcium 8.7 (*)    GFR calc non Af Amer 43 (*)    GFR calc Af Amer 50 (*)    All other components within normal limits  BRAIN NATRIURETIC PEPTIDE - Abnormal; Notable for the following:    B Natriuretic Peptide 408.0 (*)    All other components within normal limits  CBC  URINALYSIS, COMPLETE (UACMP) WITH MICROSCOPIC  CBG MONITORING, ED   ____________________________________________  EKG  reviewed and interpreted by me at 1640 Heart rate 110 QRS 110 QTc 420 A. fib, nonspecific T-wave abnormality with slight depressions noted in inferolateral distribution   ____________________________________________  RADIOLOGY  Dg Chest 2 View  Result Date: 10/09/2017 CLINICAL DATA:  Shortness of breath for 3-4 days. EXAM: CHEST  2 VIEW COMPARISON:  Single-view of the chest 05/01/2017. PA and lateral chest 04/30/2017 and 07/08/2010. FINDINGS: Streaky airspace disease is seen in the right lung base. Trace right pleural effusion is noted. Left lung is clear. Heart size is upper normal. Aortic atherosclerosis is seen. No pneumothorax. No acute bony abnormality. IMPRESSION: Trace right pleural effusion and streaky right basilar airspace disease which could be atelectasis or pneumonia. Atherosclerosis. Electronically Signed   By: Drusilla Kanner M.D.   On: 10/09/2017 15:26   Ct Head Wo Contrast  Result Date: 10/09/2017 CLINICAL DATA:  Increasing weakness and falls EXAM: CT HEAD WITHOUT CONTRAST CT CERVICAL SPINE WITHOUT CONTRAST TECHNIQUE: Multidetector CT imaging of the head and cervical spine was performed following the standard protocol without intravenous contrast. Multiplanar CT image reconstructions of the cervical spine were also generated. COMPARISON:  04/30/2017, by report from 10/19/2005  FINDINGS: CT HEAD FINDINGS Brain: Chronic atrophic and ischemic changes are again identified and stable. No findings to suggest acute hemorrhage, acute infarction or space-occupying mass lesion are noted. Vascular: No hyperdense vessel or unexpected calcification. Skull: Normal. Negative for fracture or focal lesion. Sinuses/Orbits: No acute finding. Other: Soft tissue hematoma is noted in the left forehead. CT CERVICAL SPINE FINDINGS Alignment: Alignment is well maintained. Skull base and vertebrae: There is an undisplaced fracture at the base of the odontoid. It extends from the anterior margin of C2 posteriorly but does not appear to involve the posterior cortex  of C2. Large bridging osteophytes are noted from C3 to T2. These are stable in appearance from the prior MRI examination. These changes are in part due to prior cervical fusion. Facet hypertrophic changes are noted at multiple levels. No other fracture is seen. Soft tissues and spinal canal: No significant prevertebral soft tissue abnormality is noted. A a rounded fluid attenuation lesion is noted in the posterior soft tissues of the neck eccentric to the right likely related to a sebaceous cyst. Diffuse vascular calcifications are noted. Upper chest: Within normal limits. Other: None IMPRESSION: CT of the head: Chronic atrophic and ischemic changes without acute intracranial abnormality. Left frontal scalp hematoma. CT of the cervical spine: Fracture through the anterior aspect of the base of the odontoid which does not appear to extend to the posterior cortical margin of C2. Multilevel degenerative changes with anterior osteophytes and facet hypertrophic changes. No other fracture is seen. Critical Value/emergent results were called by telephone at the time of interpretation on 10/09/2017 at 5:00 pm to Dr. Sharyn Creamer , who verbally acknowledged these results. Electronically Signed   By: Alcide Clever M.D.   On: 10/09/2017 17:02   Ct Cervical Spine Wo  Contrast  Result Date: 10/09/2017 CLINICAL DATA:  Increasing weakness and falls EXAM: CT HEAD WITHOUT CONTRAST CT CERVICAL SPINE WITHOUT CONTRAST TECHNIQUE: Multidetector CT imaging of the head and cervical spine was performed following the standard protocol without intravenous contrast. Multiplanar CT image reconstructions of the cervical spine were also generated. COMPARISON:  04/30/2017, by report from 10/19/2005 FINDINGS: CT HEAD FINDINGS Brain: Chronic atrophic and ischemic changes are again identified and stable. No findings to suggest acute hemorrhage, acute infarction or space-occupying mass lesion are noted. Vascular: No hyperdense vessel or unexpected calcification. Skull: Normal. Negative for fracture or focal lesion. Sinuses/Orbits: No acute finding. Other: Soft tissue hematoma is noted in the left forehead. CT CERVICAL SPINE FINDINGS Alignment: Alignment is well maintained. Skull base and vertebrae: There is an undisplaced fracture at the base of the odontoid. It extends from the anterior margin of C2 posteriorly but does not appear to involve the posterior cortex of C2. Large bridging osteophytes are noted from C3 to T2. These are stable in appearance from the prior MRI examination. These changes are in part due to prior cervical fusion. Facet hypertrophic changes are noted at multiple levels. No other fracture is seen. Soft tissues and spinal canal: No significant prevertebral soft tissue abnormality is noted. A a rounded fluid attenuation lesion is noted in the posterior soft tissues of the neck eccentric to the right likely related to a sebaceous cyst. Diffuse vascular calcifications are noted. Upper chest: Within normal limits. Other: None IMPRESSION: CT of the head: Chronic atrophic and ischemic changes without acute intracranial abnormality. Left frontal scalp hematoma. CT of the cervical spine: Fracture through the anterior aspect of the base of the odontoid which does not appear to extend to  the posterior cortical margin of C2. Multilevel degenerative changes with anterior osteophytes and facet hypertrophic changes. No other fracture is seen. Critical Value/emergent results were called by telephone at the time of interpretation on 10/09/2017 at 5:00 pm to Dr. Sharyn Creamer , who verbally acknowledged these results. Electronically Signed   By: Alcide Clever M.D.   On: 10/09/2017 17:02     results reviewed, most notably a C2 fractures detected ____________________________________________   PROCEDURES  Procedure(s) performed: None  Procedures  Critical Care performed: Yes, see critical care note(s)  CRITICAL CARE Performed by: Fanny Bien  MARK   Total critical care time: 50 minutes  Critical care time was exclusive of separately billable procedures and treating other patients.  Critical care was necessary to treat or prevent imminent or life-threatening deterioration.  Critical care was time spent personally by me on the following activities: development of treatment plan with patient and/or surrogate as well as nursing, discussions with consultants, evaluation of patient's response to treatment, examination of patient, obtaining history from patient or surrogate, ordering and performing treatments and interventions, ordering and review of laboratory studies, ordering and review of radiographic studies, pulse oximetry and re-evaluation of patient's condition.  Patient has cervical spine fracture. Also required multiple doses of IV rate control medications for new onset A. fib with RVR.  ____________________________________________   INITIAL IMPRESSION / ASSESSMENT AND PLAN / ED COURSE  Pertinent labs & imaging results that were available during my care of the patient were reviewed by me and considered in my medical decision making (see chart for details).    Clinical Course as of Oct 09 1834  Wynelle Link Oct 09, 2017  1631 CT Head Wo Contrast [MQ]  1631 CT Cervical Spine Wo  Contrast [MQ]  1709 patient remains in cervical collar. Understanding of concerns for C-spine fracture. Patient requesting to be evaluated at [facility] neurosurgery, family has had good experience with Dr. Lovell Sheehan in the past and requests transfer to [facility] if he needs spinal surgery consultation which he clearly does.  [MQ]  1722 Case discussed with Dr. Venetia Maxon at [facility]. Advises management with cervical collar.   [MQ]    Clinical Course User Index [MQ] Sharyn Creamer, MD   ----------------------------------------- 4:20 PM on 10/09/2017 -----------------------------------------  Labs reviewed, BNP elevated. In the setting of elevated BNP and bilateral lower extremity edema with exertional dyspnea I'm suspicious this represents volume overload and probably some type of congestive heart failure. Not able to locate a previous echo, and he has no history of CHF or A. fib. Due to the A. fib with rapid response, I did give low dose of Toprol4 rate improvement, anticipated admission to the hospital if CT of the head reveals no intracranial injury. The patient will clearly need an echo, careful workup, and likely diuresis moving forward. He is presently hemodynamically stable without evidence of increased work of breathing while at rest.he does not appear a good candidate for anticoagulation at present due to his recent fall and trauma occurring today.  ----------------------------------------- 6:36 PM on 10/09/2017 -----------------------------------------    patient heart rate improved, reports improvement and is alert and oriented, compliant and wearing cervical collar correctly. Dr. Venetia Maxon advises the patient does not need emergent neurosurgical consult, it does appear we have no neurosurgical coverage tomorrow at [facility]. Patient will not require transfer this evening to see neurosurgery. I discussed case with the hospitalist will admit, they are aware of the cervical spine injury as  well as head injury as well and I recommend against anticoagulation at present  ____________________________________________   FINAL CLINICAL IMPRESSION(S) / ED DIAGNOSES  Final diagnoses:  Atrial fibrillation with rapid ventricular response (HCC)  Scalp hematoma, initial encounter  Abrasion of left hand, initial encounter  Closed nondisplaced fracture of second cervical vertebra, unspecified fracture morphology, initial encounter (HCC)  Hypervolemia, unspecified hypervolemia type  Acute congestive heart failure, unspecified heart failure type (HCC)      NEW MEDICATIONS STARTED DURING THIS VISIT:  New Prescriptions   No medications on file     Note:  This document was prepared  using Conservation officer, historic buildings and may include unintentional dictation errors.     Sharyn Creamer, MD 10/09/17 Silvana Newness, MD 10/09/17 [phone]

## 2017-10-09 NOTE — H&P (Signed)
PCP:   Malva Limes, MD   Chief Complaint:  Fall   HPI: This is a  83 year old male who went to a family reunion today, mom on returning home at approximately 2 PM he got of the car and had a mechanical fall. His son was present it was a witnessed fall. He hit his head. There is a loss of consciousness but he did complain of neck pain. He had profuse bleeding and with the neck pain he was taken to the ER. There is a report of any nausea, vomiting, disorientation, seizures. In the ER CT head showed hematoma of the left forehead and a odontoid fracture.   The patient also complaining of shortness of breath for the last few days. He denies any wheezing or cough. He has lower extremity edema bilaterally but he states he did not notice this until it was pointed out today. He denies any chest pains or any heart palpitation. He states there is increased urine output. He does have a history of coronary artery disease, he has no history of prior congestive heart failure. He does have a cardiac stent.  The patient denies any fever, chills, diarrhea or being ill prior to this. History provided by the patient as well as his son who is present at bedside.  Review of Systems:  The patient denies anorexia, fever, fall, weight loss,, vision loss, decreased hearing, hoarseness, chest pain, syncope, dyspnea on exertion, peripheral edema, balance deficits, hemoptysis, abdominal pain, melena, hematochezia, severe indigestion/heartburn, hematuria, incontinence, genital sores, muscle weakness, suspicious skin lesions, transient blindness, difficulty walking, depression, unusual weight change, abnormal bleeding, enlarged lymph nodes, angioedema, and breast masses.  Past Medical History: Past Medical History:  Diagnosis Date  . Allergy   . BPH (benign prostatic hyperplasia)   . Chronic kidney disease   . DDD (degenerative disc disease), lumbar   . Depression   . Diverticulosis   . Elevated PSA   . Gross  hematuria   . Gynecomastia   . Headache   . History of kidney stones   . HTN (hypertension)   . Incomplete bladder emptying   . Nodular prostate with urinary obstruction   . Organic impotence   . OSA (obstructive sleep apnea)   . Renal colic   . Rosacea   . Sciatica   . Urinary frequency    Past Surgical History:  Procedure Laterality Date  . ANGIOPLASTY  2001   PTCA and stenting of LAD by Dr. Juliann Pares  . CATARACT EXTRACTION Bilateral 07/2006  . CT Scan of head  12/01/2004   Normal  . DOPPLER ECHOCARDIOGRAPHY  03/19/2013   Normal; Moderate global LV dysfunction. EF=45%. Mild Aortic insufficiency  . Double Ureter stent placement  08/1999   Double J Ureter stent placement  . EXTRACORPOREAL SHOCK WAVE LITHOTRIPSY Right 04/29/2016   Procedure: EXTRACORPOREAL SHOCK WAVE LITHOTRIPSY (ESWL);  Surgeon: Vanna Scotland, MD;  Location: [facility] ORS;  Service: Urology;  Laterality: Right;  . MRI CERVICAL SPINE WO CONTRAST ([facility] HX)  01/28/2010   Abnormal Results; Arhritis and bulging discs. Referral to Neurosurgery  . MRI CERVICAL SPINE WO CONTRAST ([facility] HX)  10/19/2005   Abnormal, Bone spurs  . Sleep study  10/06/2011   Severe sleep apnea. AHI= 35.4 per hr. Done at [facility]  . SPIROMETRY  09/11/2007   Moderately Severe obstruction  . TRANSURETHRAL RESECTION OF PROSTATE      Medications: Prior to Admission medications   Medication Sig Start Date End Date Taking? Authorizing Provider  amLODipine (NORVASC) 5 MG tablet Take 1 tablet (5 mg total) by mouth daily. 07/26/17 07/26/18 Yes Enid Baas, MD  bisoprolol-hydrochlorothiazide [facility]) 5-6.25 MG tablet take 1 tablet by mouth once daily 08/02/17  Yes Fisher, Demetrios Isaacs, MD  finasteride (PROSCAR) 5 MG tablet Take 1 tablet (5 mg total) by mouth daily. 07/26/17  Yes Enid Baas, MD  simvastatin (ZOCOR) 40 MG tablet Take 1 tablet by mouth at bedtime.  01/21/16  Yes [provider]  tamsulosin (FLOMAX) 0.4 MG CAPS capsule  Take 1 capsule (0.4 mg total) by mouth daily. 09/15/17  Yes Malva Limes, MD    Allergies:   Allergies  Allergen Reactions  . Finasteride     Other reaction(s): Other (See Comments) PAIN IN BREAST    Social History:  reports that he quit smoking about 38 years ago. His smoking use included Cigarettes. He has a 75.00 pack-year smoking history. He has never used smokeless tobacco. He reports that he does not drink alcohol or use drugs.  Family History: Family History  Problem Relation Age of Onset  . Heart disease Father   . Diabetes Father        type 2  . Hyperlipidemia Father   . Stroke Father   . Kidney disease Father   . Hypertension Other   . Breast cancer Other   . Lung cancer Other   . Alcohol abuse Paternal Uncle   . Cancer Sister   . Cancer Son   . Prostate cancer Neg Hx     Physical Exam: Vitals:   10/09/17 1444 10/09/17 1445 10/09/17 1600 10/09/17 1710  BP: (!) 121/92  (!) 161/83 (!) 161/93  Pulse: 82  (!) 111 (!) 131  Resp: 20  (!) 26 (!) 24  Temp: 98.1 F (36.7 C)     TempSrc: Oral     SpO2: 96%  95% 95%  Weight:  87.5 kg (193 lb)    Height:   (1.753 m)      General:  Alert and oriented times three, well developed and nourished, no acute distress, laceration forehead left sided Eyes: PERRLA, pink conjunctiva, no scleral icterus ENT: Moist oral mucosa, neck supple, no thyromegaly, soft neck collar Lungs: clear to ascultation, no wheeze, no crackles, no use of accessory muscles Cardiovascular: irregular rate and rhythm, no regurgitation, no gallops, no murmurs. No carotid bruits, no JVD Abdomen: soft, positive BS, non-tender, non-distended, no organomegaly, not an acute abdomen GU: not examined Neuro: CN II - XII grossly intact, sensation intact Musculoskeletal: strength 5/5 all extremities, no clubbing, cyanosis, >3+ B/L LE [pitting edema Skin: no rash, no subcutaneous crepitation, no decubitus Psych: appropriate patient   Labs on  Admission:   Recent Labs  10/09/17 1445  NA 140  K 3.8  CL 106  CO2 25  GLUCOSE 124*  BUN 30*  CREATININE 1.42*  CALCIUM 8.7*   No results for input(s): AST, ALT, ALKPHOS, BILITOT, PROT, ALBUMIN in the last 72 hours. No results for input(s): LIPASE, AMYLASE in the last 72 hours.  Recent Labs  10/09/17 1445  WBC 9.3  HGB 13.9  HCT 42.3  MCV 91.5  PLT 196   No results for input(s): CKTOTAL, CKMB, CKMBINDEX, TROPONINI in the last 72 hours. Invalid input(s): POCBNP No results for input(s): DDIMER in the last 72 hours. No results for input(s): HGBA1C in the last 72 hours. No results for input(s): CHOL, HDL, LDLCALC, TRIG, CHOLHDL, LDLDIRECT in the last 72 hours. No results for input(s):  TSH, T4TOTAL, T3FREE, THYROIDAB in the last 72 hours.  Invalid input(s): FREET3 No results for input(s): VITAMINB12, FOLATE, FERRITIN, TIBC, IRON, RETICCTPCT in the last 72 hours.  Micro Results: No results found for this or any previous visit (from the past 240 hour(s)).   Radiological Exams on Admission: Dg Chest 2 View  Result Date: 10/09/2017 CLINICAL DATA:  Shortness of breath for 3-4 days. EXAM: CHEST  2 VIEW COMPARISON:  Single-view of the chest 05/01/2017. PA and lateral chest 04/30/2017 and 07/08/2010. FINDINGS: Streaky airspace disease is seen in the right lung base. Trace right pleural effusion is noted. Left lung is clear. Heart size is upper normal. Aortic atherosclerosis is seen. No pneumothorax. No acute bony abnormality. IMPRESSION: Trace right pleural effusion and streaky right basilar airspace disease which could be atelectasis or pneumonia. Atherosclerosis. Electronically Signed   By: Drusilla Kanner M.D.   On: 10/09/2017 15:26   Ct Head Wo Contrast  Result Date: 10/09/2017 CLINICAL DATA:  Increasing weakness and falls EXAM: CT HEAD WITHOUT CONTRAST CT CERVICAL SPINE WITHOUT CONTRAST TECHNIQUE: Multidetector CT imaging of the head and cervical spine was performed  following the standard protocol without intravenous contrast. Multiplanar CT image reconstructions of the cervical spine were also generated. COMPARISON:  04/30/2017, by report from 10/19/2005 FINDINGS: CT HEAD FINDINGS Brain: Chronic atrophic and ischemic changes are again identified and stable. No findings to suggest acute hemorrhage, acute infarction or space-occupying mass lesion are noted. Vascular: No hyperdense vessel or unexpected calcification. Skull: Normal. Negative for fracture or focal lesion. Sinuses/Orbits: No acute finding. Other: Soft tissue hematoma is noted in the left forehead. CT CERVICAL SPINE FINDINGS Alignment: Alignment is well maintained. Skull base and vertebrae: There is an undisplaced fracture at the base of the odontoid. It extends from the anterior margin of C2 posteriorly but does not appear to involve the posterior cortex of C2. Large bridging osteophytes are noted from C3 to T2. These are stable in appearance from the prior MRI examination. These changes are in part due to prior cervical fusion. Facet hypertrophic changes are noted at multiple levels. No other fracture is seen. Soft tissues and spinal canal: No significant prevertebral soft tissue abnormality is noted. A a rounded fluid attenuation lesion is noted in the posterior soft tissues of the neck eccentric to the right likely related to a sebaceous cyst. Diffuse vascular calcifications are noted. Upper chest: Within normal limits. Other: None IMPRESSION: CT of the head: Chronic atrophic and ischemic changes without acute intracranial abnormality. Left frontal scalp hematoma. CT of the cervical spine: Fracture through the anterior aspect of the base of the odontoid which does not appear to extend to the posterior cortical margin of C2. Multilevel degenerative changes with anterior osteophytes and facet hypertrophic changes. No other fracture is seen. Critical Value/emergent results were called by telephone at the time of  interpretation on 10/09/2017 at 5:00 pm to Dr. Sharyn Creamer , who verbally acknowledged these results. Electronically Signed   By: Alcide Clever M.D.   On: 10/09/2017 17:02   Ct Cervical Spine Wo Contrast  Result Date: 10/09/2017 CLINICAL DATA:  Increasing weakness and falls EXAM: CT HEAD WITHOUT CONTRAST CT CERVICAL SPINE WITHOUT CONTRAST TECHNIQUE: Multidetector CT imaging of the head and cervical spine was performed following the standard protocol without intravenous contrast. Multiplanar CT image reconstructions of the cervical spine were also generated. COMPARISON:  04/30/2017, by report from 10/19/2005 FINDINGS: CT HEAD FINDINGS Brain: Chronic atrophic and ischemic changes are again identified and stable. No  findings to suggest acute hemorrhage, acute infarction or space-occupying mass lesion are noted. Vascular: No hyperdense vessel or unexpected calcification. Skull: Normal. Negative for fracture or focal lesion. Sinuses/Orbits: No acute finding. Other: Soft tissue hematoma is noted in the left forehead. CT CERVICAL SPINE FINDINGS Alignment: Alignment is well maintained. Skull base and vertebrae: There is an undisplaced fracture at the base of the odontoid. It extends from the anterior margin of C2 posteriorly but does not appear to involve the posterior cortex of C2. Large bridging osteophytes are noted from C3 to T2. These are stable in appearance from the prior MRI examination. These changes are in part due to prior cervical fusion. Facet hypertrophic changes are noted at multiple levels. No other fracture is seen. Soft tissues and spinal canal: No significant prevertebral soft tissue abnormality is noted. A a rounded fluid attenuation lesion is noted in the posterior soft tissues of the neck eccentric to the right likely related to a sebaceous cyst. Diffuse vascular calcifications are noted. Upper chest: Within normal limits. Other: None IMPRESSION: CT of the head: Chronic atrophic and ischemic changes  without acute intracranial abnormality. Left frontal scalp hematoma. CT of the cervical spine: Fracture through the anterior aspect of the base of the odontoid which does not appear to extend to the posterior cortical margin of C2. Multilevel degenerative changes with anterior osteophytes and facet hypertrophic changes. No other fracture is seen. Critical Value/emergent results were called by telephone at the time of interpretation on 10/09/2017 at 5:00 pm to Dr. Sharyn Creamer , who verbally acknowledged these results. Electronically Signed   By: Alcide Clever M.D.   On: 10/09/2017 17:02    Assessment/Plan Present on Admission: . Atrial fibrillation with RVR (HCC) -Admit to MedSurg with telemetry -Start Coreg 12.5 mg by mouth twice a day, first dose now -Lopressor when necessary for systolic blood pressure greater than 160 and heart rate greater than 110 -TSH in a.m., 2-D echo in a.m. -No anticoagulation given patient's hematoma  . SOB (shortness of breath) -Possible congestive heart failure, BNP is normal. 2-D echo ordered -For patient's fluid overload IV Lasix, daily weights, strict I's and O's  . Odontoid fracture (HCC)  -C collar on. Patient discussed with orthopedics who recommends placing consut for neurosurgery at [facility] clinic who  will see the patient tomorrow  . CAD (coronary artery disease) -See above  . Chronic kidney disease (CKD), stage III (moderate) (HCC) -Stable at baseline, monitor carefully givn IV Lasix usage  . COPD (chronic obstructive pulmonary disease) (HCC)  -Currently stable, no evidence of COPD exacerbation  . Essential (primary) hypertension -Stable, patient;s Norvasc and this a prolonged held. Coreg initiated, titrated Dose or rate control  . HLD (hyperlipidemia) -Stable, home medications resumed  . Obstructive sleep apnea -Aware, patient not in CPAP  . BPH (benign prostatic hyperplasia) -Aware   Kalisha Keadle 10/09/2017, 6:23 PM

## 2017-10-10 LAB — CBC
HCT: 41.4 % (ref 40.0–52.0)
Hemoglobin: 13.6 g/dL (ref 13.0–18.0)
MCH: 30 pg (ref 26.0–34.0)
MCHC: 32.9 g/dL (ref 32.0–36.0)
MCV: 91.1 fL (ref 80.0–100.0)
PLATELETS: 171 10*3/uL (ref 150–440)
RBC: 4.55 MIL/uL (ref 4.40–5.90)
RDW: 14 % (ref 11.5–14.5)
WBC: 7.8 10*3/uL (ref 3.8–10.6)

## 2017-10-10 LAB — BASIC METABOLIC PANEL
Anion gap: 9 (ref 5–15)
BUN: 26 mg/dL — AB (ref 6–20)
CALCIUM: 8.8 mg/dL — AB (ref 8.9–10.3)
CO2: 24 mmol/L (ref 22–32)
CREATININE: 1.28 mg/dL — AB (ref 0.61–1.24)
Chloride: 107 mmol/L (ref 101–111)
GFR calc Af Amer: 56 mL/min — ABNORMAL LOW (ref >60)
GFR, EST NON AFRICAN AMERICAN: 49 mL/min — AB (ref >60)
GLUCOSE: 166 mg/dL — AB (ref 65–99)
Potassium: 4 mmol/L (ref 3.5–5.1)
SODIUM: 140 mmol/L (ref 135–145)

## 2017-10-10 LAB — TSH: TSH: 1.227 u[IU]/mL (ref 0.350–4.500)

## 2017-10-10 LAB — GLUCOSE, CAPILLARY: Glucose-Capillary: 130 mg/dL — ABNORMAL HIGH (ref 65–99)

## 2017-10-10 MED ORDER — SODIUM CHLORIDE 0.9% FLUSH
3.0000 mL | Freq: Two times a day (BID) | INTRAVENOUS | Status: DC
  Administered 2017-10-10 – 2017-10-13 (×6): 3 mL via INTRAVENOUS

## 2017-10-10 MED ORDER — METOPROLOL TARTRATE 25 MG PO TABS
25.0000 mg | ORAL_TABLET | Freq: Two times a day (BID) | ORAL | Status: DC
  Administered 2017-10-10 – 2017-10-11 (×3): 25 mg via ORAL
  Filled 2017-10-10 (×3): qty 1

## 2017-10-10 MED ORDER — FINASTERIDE 5 MG PO TABS
5.0000 mg | ORAL_TABLET | Freq: Every day | ORAL | Status: DC
  Administered 2017-10-10 – 2017-10-13 (×4): 5 mg via ORAL
  Filled 2017-10-10 (×4): qty 1

## 2017-10-10 NOTE — Evaluation (Signed)
Physical Therapy Evaluation Patient Details Name: Jose [name]. MRN: [id number] DOB: December 16, 1928 Today's Date: 10/10/2017   History of Present Illness  Pt is an 83 y.o. male presenting to hospital with SOB x2 days and s/p fall tripping on sidewalk; pt found to have hematoma L forehead and odontoid fx.  Pt admitted to hospital with a-fib with RVR, SOB, and odontoid fx (incomplete fx and does not extend into posterior cortical margin).  PMH includes sleep apnea, CKD, and COPD.  Clinical Impression  Prior to hospital admission, pt was modified independent ambulating with SPC.  Pt lives alone in 1 level home with stairs to enter.  Currently pt is CGA with transfers and marching in place x10 reps (no AD).  Pt's HR initially 118-124 bpm at rest but increased to 144-156 bpm with standing marching so further activity deferred and pt assisted into sitting (HR decreased back down to 108-124 bpm resting in chair within a minute); nursing notified of pt's HR with minimal activity.  Pt would benefit from skilled PT to address noted impairments and functional limitations (see below for any additional details).  Upon hospital discharge, recommend pt discharge to home with HHPT.    Follow Up Recommendations Home health PT    Equipment Recommendations  Cane    Recommendations for Other Services       Precautions / Restrictions Precautions Precautions: Fall;Cervical Required Braces or Orthoses: Cervical Brace Cervical Brace: Hard collar Restrictions Weight Bearing Restrictions: No      Mobility  Bed Mobility               General bed mobility comments: Deferred d/t pt sitting up in chair beginning and end of session.  Transfers Overall transfer level: Needs assistance Equipment used: None Transfers: Sit to/from Stand Sit to Stand: Min guard         General transfer comment: strong stand and controlled descent into sitting; steady  Ambulation/Gait Ambulation/Gait assistance: Min  guard Ambulation Distance (Feet):  (marching in place x10 reps) Assistive device: None       General Gait Details: steady; no loss of balance noted; limited d/t HR elevation with activity  Stairs            Wheelchair Mobility    Modified Rankin (Stroke Patients Only)       Balance Overall balance assessment: History of Falls;Needs assistance Sitting-balance support: No upper extremity supported;Feet supported Sitting balance-Leahy Scale: Good Sitting balance - Comments: sitting reaching within BOS   Standing balance support: No upper extremity supported Standing balance-Leahy Scale: Good Standing balance comment: standing marching without UE support                             Pertinent Vitals/Pain Pain Assessment: No/denies pain     Home Living Family/patient expects to be discharged to:: Private residence Living Arrangements: Alone   Type of Home: House Home Access: Stairs to enter   [employer] of Steps: 3 with B railings plus 1 step no railing (can hold onto door frame) Home Layout: One level Home Equipment: Cane - single point;Shower seat - built in;Grab bars - tub/shower      Prior Function Level of Independence: Independent with assistive device(s)         Comments: Pt ambulates with SPC.  3 falls in past 6 months.  Housekeeper comes 1x/week to clean.     Hand Dominance        Extremity/Trunk  Assessment   Upper Extremity Assessment Upper Extremity Assessment:  (Good B grip strength; intact sensation; good AROM B elbow flexion/extension (and shoulder flexion to at least 80 degrees) )    Lower Extremity Assessment Lower Extremity Assessment: Overall WFL for tasks assessed    Cervical / Trunk Assessment Cervical / Trunk Assessment: Normal  Communication   Communication: No difficulties  Cognition Arousal/Alertness: Awake/alert Behavior During Therapy: WFL for tasks assessed/performed Overall Cognitive Status:  Within Functional Limits for tasks assessed                                        General Comments General comments (skin integrity, edema, etc.): Pt's son present during session.  Hard collar in place and fitting appropriately.  Nursing cleared pt for participation in physical therapy.  Pt agreeable to PT session.    Exercises     Assessment/Plan    PT Assessment Patient needs continued PT services  PT Problem List Decreased activity tolerance;Decreased balance;Decreased mobility;Decreased knowledge of precautions;Cardiopulmonary status limiting activity       PT Treatment Interventions DME instruction;Gait training;Stair training;Functional mobility training;Therapeutic activities;Therapeutic exercise;Balance training;Patient/family education    PT Goals (Current goals can be found in the Care Plan section)  Acute Rehab PT Goals Patient Stated Goal: to go home PT Goal Formulation: With patient/family Time For Goal Achievement: 10/24/17 Potential to Achieve Goals: Good    Frequency Min 2X/week   Barriers to discharge        Co-evaluation               AM-PAC PT "6 Clicks" Daily Activity  Outcome Measure Difficulty turning over in bed (including adjusting bedclothes, sheets and blankets)?: A Little Difficulty moving from lying on back to sitting on the side of the bed? : A Lot Difficulty sitting down on and standing up from a chair with arms (e.g., wheelchair, bedside commode, etc,.)?: A Little Help needed moving to and from a bed to chair (including a wheelchair)?: A Little Help needed walking in hospital room?: A Little Help needed climbing 3-5 steps with a railing? : A Little 6 Click Score: 17    End of Session Equipment Utilized During Treatment: Gait belt;Cervical collar;Oxygen (2 L via nasal cannula) Activity Tolerance: Treatment limited secondary to medical complications (Comment) (Limited d/t HR elevation with activity) Patient left: in  chair;with call bell/phone within reach;with chair alarm set;with family/visitor present Nurse Communication: Mobility status;Precautions;Other (comment) (Pt's HR during session) PT Visit Diagnosis: Other abnormalities of gait and mobility (R26.89);Muscle weakness (generalized) (M62.81);History of falling (Z91.81)    Time: 4540-9811 PT Time Calculation (min) (ACUTE ONLY): 25 min   Charges:   PT Evaluation $PT Eval Low Complexity: 1 Low     PT G Codes:   PT G-Codes **NOT FOR INPATIENT CLASS** Functional Assessment Tool Used: AM-PAC 6 Clicks Basic Mobility Functional Limitation: Mobility: Walking and moving around Mobility: Walking and Moving Around Current Status (B1478): At least 40 percent but less than 60 percent impaired, limited or restricted Mobility: Walking and Moving Around Goal Status [phone]): 0 percent impaired, limited or restricted    Hendricks Limes, PT 10/10/17, 5:11 PM [phone]

## 2017-10-10 NOTE — Progress Notes (Addendum)
[facility] - [place] at [facility]   PATIENT NAME: Jose [name]    MR#:  [id number]  DATE OF BIRTH:  11-11-1928  SUBJECTIVE:   Came in after having mechanical fall at home in his driveway Found to be in rapid Afib in ER Leg swelling+ REVIEW OF SYSTEMS:   Review of Systems  Constitutional: Negative for chills, fever and weight loss.  HENT: Negative for ear discharge, ear pain and nosebleeds.   Eyes: Negative for blurred vision, pain and discharge.  Respiratory: Negative for sputum production, shortness of breath, wheezing and stridor.   Cardiovascular: Positive for palpitations. Negative for chest pain, orthopnea and PND.  Gastrointestinal: Negative for abdominal pain, diarrhea, nausea and vomiting.  Genitourinary: Negative for frequency and urgency.  Musculoskeletal: Positive for neck pain. Negative for back pain and joint pain.  Neurological: Positive for weakness. Negative for sensory change, speech change and focal weakness.  Psychiatric/Behavioral: Negative for depression and hallucinations. The patient is not nervous/anxious.    Tolerating Diet:yes Tolerating PT: pending  DRUG ALLERGIES:   Allergies  Allergen Reactions  . Finasteride     Other reaction(s): Other (See Comments) PAIN IN BREAST    VITALS:  Blood pressure 118/68, pulse (!) 118, temperature 98 F (36.7 C), temperature source Oral, resp. rate 18, height  (1.753 m), weight 91.5 kg (201 lb 12.8 oz), SpO2 93 %.  PHYSICAL EXAMINATION:   Physical Exam  GENERAL:  83 y.o.-year-old patient lying in the bed with no acute distress.  EYES: Pupils equal, round, reactive to light and accommodation. No scleral icterus. Extraocular muscles intact.  HEENT: Head atraumatic, normocephalic. Oropharynx and nasopharynx clear.  NECK:  Supple, no jugular venous distention. No thyroid enlargement, no tenderness. Neck collar+ LUNGS: Normal breath sounds bilaterally, no wheezing, rales, rhonchi.  No use of accessory muscles of respiration.  CARDIOVASCULAR: S1, S2 normal. No murmurs, rubs, or gallops. Irregular rhythm ABDOMEN: Soft, nontender, nondistended. Bowel sounds present. No organomegaly or mass.  EXTREMITIES: No cyanosis, clubbing or edema b/l.    NEUROLOGIC: Cranial nerves II through XII are intact. No focal Motor or sensory deficits b/l.   PSYCHIATRIC:  patient is alert and oriented x 3.  SKIN: No obvious rash, lesion, or ulcer.   LABORATORY PANEL:  CBC  Recent Labs Lab 10/10/17 0519  WBC 7.8  HGB 13.6  HCT 41.4  PLT 171    Chemistries   Recent Labs Lab 10/10/17 0519  NA 140  K 4.0  CL 107  CO2 24  GLUCOSE 166*  BUN 26*  CREATININE 1.28*  CALCIUM 8.8*   Cardiac Enzymes No results for input(s): TROPONINI in the last 168 hours. RADIOLOGY:  Dg Chest 2 View  Result Date: 10/09/2017 CLINICAL DATA:  Shortness of breath for 3-4 days. EXAM: CHEST  2 VIEW COMPARISON:  Single-view of the chest 05/01/2017. PA and lateral chest 04/30/2017 and 07/08/2010. FINDINGS: Streaky airspace disease is seen in the right lung base. Trace right pleural effusion is noted. Left lung is clear. Heart size is upper normal. Aortic atherosclerosis is seen. No pneumothorax. No acute bony abnormality. IMPRESSION: Trace right pleural effusion and streaky right basilar airspace disease which could be atelectasis or pneumonia. Atherosclerosis. Electronically Signed   By: Drusilla Kanner M.D.   On: 10/09/2017 15:26   Ct Head Wo Contrast  Result Date: 10/09/2017 CLINICAL DATA:  Increasing weakness and falls EXAM: CT HEAD WITHOUT CONTRAST CT CERVICAL SPINE WITHOUT CONTRAST TECHNIQUE: Multidetector CT imaging of the head and cervical  spine was performed following the standard protocol without intravenous contrast. Multiplanar CT image reconstructions of the cervical spine were also generated. COMPARISON:  04/30/2017, by report from 10/19/2005 FINDINGS: CT HEAD FINDINGS Brain: Chronic atrophic  and ischemic changes are again identified and stable. No findings to suggest acute hemorrhage, acute infarction or space-occupying mass lesion are noted. Vascular: No hyperdense vessel or unexpected calcification. Skull: Normal. Negative for fracture or focal lesion. Sinuses/Orbits: No acute finding. Other: Soft tissue hematoma is noted in the left forehead. CT CERVICAL SPINE FINDINGS Alignment: Alignment is well maintained. Skull base and vertebrae: There is an undisplaced fracture at the base of the odontoid. It extends from the anterior margin of C2 posteriorly but does not appear to involve the posterior cortex of C2. Large bridging osteophytes are noted from C3 to T2. These are stable in appearance from the prior MRI examination. These changes are in part due to prior cervical fusion. Facet hypertrophic changes are noted at multiple levels. No other fracture is seen. Soft tissues and spinal canal: No significant prevertebral soft tissue abnormality is noted. A a rounded fluid attenuation lesion is noted in the posterior soft tissues of the neck eccentric to the right likely related to a sebaceous cyst. Diffuse vascular calcifications are noted. Upper chest: Within normal limits. Other: None IMPRESSION: CT of the head: Chronic atrophic and ischemic changes without acute intracranial abnormality. Left frontal scalp hematoma. CT of the cervical spine: Fracture through the anterior aspect of the base of the odontoid which does not appear to extend to the posterior cortical margin of C2. Multilevel degenerative changes with anterior osteophytes and facet hypertrophic changes. No other fracture is seen. Critical Value/emergent results were called by telephone at the time of interpretation on 10/09/2017 at 5:00 pm to Dr. Sharyn Creamer , who verbally acknowledged these results. Electronically Signed   By: Alcide Clever M.D.   On: 10/09/2017 17:02   Ct Cervical Spine Wo Contrast  Result Date: 10/09/2017 CLINICAL DATA:   Increasing weakness and falls EXAM: CT HEAD WITHOUT CONTRAST CT CERVICAL SPINE WITHOUT CONTRAST TECHNIQUE: Multidetector CT imaging of the head and cervical spine was performed following the standard protocol without intravenous contrast. Multiplanar CT image reconstructions of the cervical spine were also generated. COMPARISON:  04/30/2017, by report from 10/19/2005 FINDINGS: CT HEAD FINDINGS Brain: Chronic atrophic and ischemic changes are again identified and stable. No findings to suggest acute hemorrhage, acute infarction or space-occupying mass lesion are noted. Vascular: No hyperdense vessel or unexpected calcification. Skull: Normal. Negative for fracture or focal lesion. Sinuses/Orbits: No acute finding. Other: Soft tissue hematoma is noted in the left forehead. CT CERVICAL SPINE FINDINGS Alignment: Alignment is well maintained. Skull base and vertebrae: There is an undisplaced fracture at the base of the odontoid. It extends from the anterior margin of C2 posteriorly but does not appear to involve the posterior cortex of C2. Large bridging osteophytes are noted from C3 to T2. These are stable in appearance from the prior MRI examination. These changes are in part due to prior cervical fusion. Facet hypertrophic changes are noted at multiple levels. No other fracture is seen. Soft tissues and spinal canal: No significant prevertebral soft tissue abnormality is noted. A a rounded fluid attenuation lesion is noted in the posterior soft tissues of the neck eccentric to the right likely related to a sebaceous cyst. Diffuse vascular calcifications are noted. Upper chest: Within normal limits. Other: None IMPRESSION: CT of the head: Chronic atrophic and ischemic changes without acute  intracranial abnormality. Left frontal scalp hematoma. CT of the cervical spine: Fracture through the anterior aspect of the base of the odontoid which does not appear to extend to the posterior cortical margin of C2. Multilevel  degenerative changes with anterior osteophytes and facet hypertrophic changes. No other fracture is seen. Critical Value/emergent results were called by telephone at the time of interpretation on 10/09/2017 at 5:00 pm to Dr. Sharyn Creamer , who verbally acknowledged these results. Electronically Signed   By: Alcide Clever M.D.   On: 10/09/2017 17:02   ASSESSMENT AND PLAN:   83 year old male who went to a family reunion today, mom on returning home at approximately 2 PM he got of the car and had a mechanical fall. His son was present it was a witnessed fall. He hit his head. There is a loss of consciousness but he did complain of neck pain. He had profuse bleeding and with the neck pain he was taken to the ER  . Atrial fibrillation with RVR (HCC)-new onset -Start Coreg 12.5 mg by mouth twice a day -Lopressor when necessary for systolic blood pressure greater than 160 and heart rate greater than 110 -TSH wnl - 2-D echo today -No anticoagulation given patient's hematoma  . SOB (shortness of breath)-new onset CHF acute systolic (EF 45% in 2014-echo) -BNP is 408. 2-D echo ordered -For patient's fluid overload IV Lasix, daily weights, strict I's and O's  . Odontoid fracture (HCC) s/p fall y'day -C collar on. Patient was discussed with orthopedics who recommends placing consut for neurosurgery -spoke with Dr Noralee Stain  . CAD (coronary artery disease) -See above  . Chronic kidney disease (CKD), stage III (moderate) (HCC) -Stable at baseline, monitor carefully givn IV Lasix usage  . COPD (chronic obstructive pulmonary disease) (HCC)  -Currently stable, no evidence of COPD exacerbation  . Essential (primary) hypertension -Stable - Coreg initiated, titrated Dose or rate control  . HLD (hyperlipidemia) -Stable, home medications resumed  . Obstructive sleep apnea -Aware, patient not in CPAP  . BPH (benign prostatic hyperplasia) -cont flomax    Case discussed with Care  Management/Social Worker. Management plans discussed with the patient, family and they are in agreement.  CODE STATUS: DNR  DVT Prophylaxis: SCD/TEDs (no antplt due to hematoma)  TOTAL TIME TAKING CARE OF THIS PATIENT: 35 minutes.  >50% time spent on counselling and coordination of care  POSSIBLE D/C IN 1-2 DAYS, DEPENDING ON CLINICAL CONDITION.  Note: This dictation was prepared with Dragon dictation along with smaller phrase technology. Any transcriptional errors that result from this process are unintentional.  Artemis Koller M.D on 10/10/2017 at 8:16 AM  Between 7am to 6pm - Pager - [phone]  After 6pm go to [email or web address] - password [email or web address]  [facility]  Office  [phone]  CC: Primary care physician; Malva Limes, MDPatient ID: Simona Huh., male   DOB: [birth year], 83 y.o.   MRN: [id number]

## 2017-10-10 NOTE — Consult Note (Signed)
[facility] CLINIC CARDIOLOGY A [facility]  CARDIOLOGY CONSULT NOTE  Patient ID: Jose [name]. MRN: [id number] DOB/AGE: [birth year] 83 y.o.  Admit date: 10/09/2017 Referring Physician Dr. Enedina Finner Primary Physician   Primary Cardiologist Dr. Juliann Pares Reason for Consultation Atrial fibrillation  HPI: 83 yo male with history of hypertension, ckd iii, hyperlipidemia who was admitted after suffering a mechanical fall causing cervical spine fx and extracranial hematoma on skull. He denied blacking out prior to or subsequent to the fall. No intracranial bleed noted. He denied dizzyness. He was noted to be in afib with rvr on admission . This is apparently new to him and review of previous ekg tracings both as inpatient or outpatient did not reveal any afib. He is currently in afib with rvr at rate of 108. Is not on anticoagulation at present. Has had 3 falls per his report since May all of which were due to leg weakness and loss of balance. He denies chest pain or sob. He has been getting carvedilol and iv doses of metoprolol.  Electrolytes are normal.   Review of Systems  Constitutional: Negative.   HENT: Negative.   Eyes: Negative.   Respiratory: Negative.   Cardiovascular: Negative.   Gastrointestinal: Negative.   Genitourinary: Negative.   Musculoskeletal: Positive for neck pain.  Skin: Negative.   Neurological: Positive for headaches. Negative for dizziness.  Endo/Heme/Allergies: Negative.   Psychiatric/Behavioral: Negative.     Past Medical History:  Diagnosis Date  . Allergy   . BPH (benign prostatic hyperplasia)   . Chronic kidney disease   . DDD (degenerative disc disease), lumbar   . Depression   . Diverticulosis   . Elevated PSA   . Gross hematuria   . Gynecomastia   . Headache   . History of kidney stones   . HTN (hypertension)   . Incomplete bladder emptying   . Nodular prostate with urinary obstruction   . Organic impotence   . OSA  (obstructive sleep apnea)   . Renal colic   . Rosacea   . Sciatica   . Urinary frequency     Family History  Problem Relation Age of Onset  . Heart disease Father   . Diabetes Father        type 2  . Hyperlipidemia Father   . Stroke Father   . Kidney disease Father   . Hypertension Other   . Breast cancer Other   . Lung cancer Other   . Alcohol abuse Paternal Uncle   . Cancer Sister   . Cancer Son   . Prostate cancer Neg Hx     Social History   Social History  . Marital status: Widowed    Spouse name: N/A  . Number of children: 5  . Years of education: N/A   Occupational History  . Retired     Former Airline pilot   Social History Main Topics  . Smoking status: Former Smoker    Packs/day: 3.00    Years: 25.00    Types: Cigarettes    Quit date: 12/27/1978  . Smokeless tobacco: Never Used  . Alcohol use No  . Drug use: No  . Sexual activity: Not on file   Other Topics Concern  . Not on file   Social History Narrative  . No narrative on file    Past Surgical History:  Procedure Laterality Date  . ANGIOPLASTY  2001   PTCA and stenting of LAD by Dr. Juliann Pares  .  CATARACT EXTRACTION Bilateral 07/2006  . CT Scan of head  12/01/2004   Normal  . DOPPLER ECHOCARDIOGRAPHY  03/19/2013   Normal; Moderate global LV dysfunction. EF=45%. Mild Aortic insufficiency  . Double Ureter stent placement  08/1999   Double J Ureter stent placement  . EXTRACORPOREAL SHOCK WAVE LITHOTRIPSY Right 04/29/2016   Procedure: EXTRACORPOREAL SHOCK WAVE LITHOTRIPSY (ESWL);  Surgeon: Vanna Scotland, MD;  Location: [facility] ORS;  Service: Urology;  Laterality: Right;  . MRI CERVICAL SPINE WO CONTRAST ([facility] HX)  01/28/2010   Abnormal Results; Arhritis and bulging discs. Referral to Neurosurgery  . MRI CERVICAL SPINE WO CONTRAST ([facility] HX)  10/19/2005   Abnormal, Bone spurs  . Sleep study  10/06/2011   Severe sleep apnea. AHI= 35.4 per hr. Done at [facility]  . SPIROMETRY  09/11/2007    Moderately Severe obstruction  . TRANSURETHRAL RESECTION OF PROSTATE       Prescriptions Prior to Admission  Medication Sig Dispense Refill Last Dose  . amLODipine (NORVASC) 5 MG tablet Take 1 tablet (5 mg total) by mouth daily. 30 tablet 2 10/09/2017 at Unknown time  . bisoprolol-hydrochlorothiazide (ZIAC) 5-6.25 MG tablet take 1 tablet by mouth once daily 30 tablet 8 10/09/2017 at Unknown time  . finasteride (PROSCAR) 5 MG tablet Take 1 tablet (5 mg total) by mouth daily. 30 tablet 1 10/09/2017 at Unknown time  . simvastatin (ZOCOR) 40 MG tablet Take 1 tablet by mouth at bedtime.   0 10/08/2017 at Unknown time  . tamsulosin (FLOMAX) 0.4 MG CAPS capsule Take 1 capsule (0.4 mg total) by mouth daily. 30 capsule 5 10/09/2017 at Unknown time    Physical Exam: Blood pressure 118/68, pulse (!) 118, temperature 98 F (36.7 C), temperature source Oral, resp. rate 18, height  (1.753 m), weight 91.5 kg (201 lb 12.8 oz), SpO2 93 %.   Wt Readings from Last 1 Encounters:  10/10/17 91.5 kg (201 lb 12.8 oz)     General appearance: alert and cooperative Head: scalp contusion Resp: clear to auscultation bilaterally Cardio: irregularly irregular rhythm GI: soft, non-tender; bowel sounds normal; no masses,  no organomegaly Extremities: extremities normal, atraumatic, no cyanosis or edema Neurologic: Grossly normal  Labs:   Lab Results  Component Value Date   WBC 7.8 10/10/2017   HGB 13.6 10/10/2017   HCT 41.4 10/10/2017   MCV 91.1 10/10/2017   PLT 171 10/10/2017    Recent Labs Lab 10/10/17 0519  NA 140  K 4.0  CL 107  CO2 24  BUN 26*  CREATININE 1.28*  CALCIUM 8.8*  GLUCOSE 166*   Lab Results  Component Value Date   TROPONINI <0.03 04/30/2017   EKG: afib with rvr  ASSESSMENT AND PLAN:  83 yo s/p mechanical fall causing scalp hematoma and cervical spine fx. Noted to be in new onset afib with rvr. Will change from carvedilol to metoprolol tartrate 25 bid and titrate as rate  and pressure allow and require. Will defer chronic anticoagulation for now due to recent head trauma and pending neurosurgical evaluation . He is hemodynamically stable at present. Echo is pending. Will consider chronic anticoagulation pending neuro eval. Will follow with you. Will increase beta blockers or add calcium channel blockers as rate responds.  Signed: Dalia Heading MD, [facility] 10/10/2017, 9:10 AM

## 2017-10-10 NOTE — Consult Note (Signed)
Referring Physician:  No referring provider defined for this encounter.  Primary Physician:  Malva Limes, MD  Chief Complaint:  Neck pain/Odontoid fracture  History of Present Illness: Jose Jose [name]. is Jose [name] 83 y.o. male who presents as an inpatient consult for neck pain. Patient suffered Jose [name] forward mechanical fall 10/09/2017 after tripping on the sidewalk. Complains of neck pain but states it is better today than it was yesterday.  Denies upper and lower extremity symptoms (numbness, tingling, pain), denies saddle paresthesia, bladder/bowel dysfunction, back pain, foot drop, fine motor issues.    Jose Huh. has no symptoms of cervical myelopathy.  The symptoms are causing Jose [name] significant impact on the patient's life.   Review of Systems:  Jose [name] 10 point review of systems is negative, except for the pertinent positives and negatives detailed in the HPI.  Past Medical History: Past Medical History:  Diagnosis Date  . Allergy   . BPH (benign prostatic hyperplasia)   . Chronic kidney disease   . DDD (degenerative disc disease), lumbar   . Depression   . Diverticulosis   . Elevated PSA   . Gross hematuria   . Gynecomastia   . Headache   . History of kidney stones   . HTN (hypertension)   . Incomplete bladder emptying   . Nodular prostate with urinary obstruction   . Organic impotence   . OSA (obstructive sleep apnea)   . Renal colic   . Rosacea   . Sciatica   . Urinary frequency     Past Surgical History: Past Surgical History:  Procedure Laterality Date  . ANGIOPLASTY  2001   PTCA and stenting of LAD by Dr. Juliann Pares  . CATARACT EXTRACTION Bilateral 07/2006  . CT Scan of head  12/01/2004   Normal  . DOPPLER ECHOCARDIOGRAPHY  03/19/2013   Normal; Moderate global LV dysfunction. EF=45%. Mild Aortic insufficiency  . Double Ureter stent placement  08/1999   Double J Ureter stent placement  . EXTRACORPOREAL SHOCK WAVE LITHOTRIPSY Right 04/29/2016   Procedure:  EXTRACORPOREAL SHOCK WAVE LITHOTRIPSY (ESWL);  Surgeon: Vanna Scotland, MD;  Location: [facility] ORS;  Service: Urology;  Laterality: Right;  . MRI CERVICAL SPINE WO CONTRAST ([facility] HX)  01/28/2010   Abnormal Results; Arhritis and bulging discs. Referral to Neurosurgery  . MRI CERVICAL SPINE WO CONTRAST ([facility] HX)  10/19/2005   Abnormal, Bone spurs  . Sleep study  10/06/2011   Severe sleep apnea. AHI= 35.4 per hr. Done at [facility]  . SPIROMETRY  09/11/2007   Moderately Severe obstruction  . TRANSURETHRAL RESECTION OF PROSTATE      Allergies: Allergies as of 10/09/2017 - Review Complete 10/09/2017  Allergen Reaction Noted  . Finasteride  02/13/2016    Medications:  Current Facility-Administered Medications:  .  acetaminophen (TYLENOL) tablet 650 mg, 650 mg, Oral, Q6H PRN, 650 mg at 10/09/17 2258 **OR** acetaminophen (TYLENOL) suppository 650 mg, 650 mg, Rectal, Q6H PRN, Jose Jose [name], Debby, MD .  finasteride (PROSCAR) tablet 5 mg, 5 mg, Oral, Daily, Jose Finner, MD, 5 mg at 10/10/17 0920 .  furosemide (LASIX) injection 20 mg, 20 mg, Intravenous, Q12H, Jose Jose [name], Debby, MD, 20 mg at 10/10/17 0600 .  ipratropium-albuterol (DUONEB) 0.5-2.5 (3) MG/3ML nebulizer solution 3 mL, 3 mL, Nebulization, Q4H PRN, Jose Jose [name], Debby, MD .  metoprolol tartrate (LOPRESSOR) injection 5 mg, 5 mg, Intravenous, Q4H PRN, Jose Jose [name], Debby, MD, 5 mg at 10/10/17 0659 .  metoprolol tartrate (LOPRESSOR) tablet 25 mg, 25 mg, Oral, BID, Jose Hedge  A, MD, 25 mg at 10/10/17 0920 .  ondansetron (ZOFRAN) tablet 4 mg, 4 mg, Oral, Q6H PRN **OR** ondansetron (ZOFRAN) injection 4 mg, 4 mg, Intravenous, Q6H PRN, Jose Jose [name], Debby, MD .  polyethylene glycol (MIRALAX / GLYCOLAX) packet 17 g, 17 g, Oral, Daily PRN, Jose Jose [name], Debby, MD .  simvastatin (ZOCOR) tablet 40 mg, 40 mg, Oral, QHS, Jose Jose [name], Debby, MD, 40 mg at 10/09/17 2252 .  tamsulosin (FLOMAX) capsule 0.4 mg, 0.4 mg, Oral, Daily, Jose Jose [name], Debby, MD, 0.4 mg at 10/10/17  0920   Social History: Social History  Substance Use Topics  . Smoking status: Former Smoker    Packs/day: 3.00    Years: 25.00    Types: Cigarettes    Quit date: 12/27/1978  . Smokeless tobacco: Never Used  . Alcohol use No    Family Medical History: Family History  Problem Relation Age of Onset  . Heart disease Father   . Diabetes Father        type 2  . Hyperlipidemia Father   . Stroke Father   . Kidney disease Father   . Hypertension Other   . Breast cancer Other   . Lung cancer Other   . Alcohol abuse Paternal Uncle   . Cancer Sister   . Cancer Son   . Prostate cancer Neg Hx     Physical Examination: Vitals:   10/10/17 0442 10/10/17 0919  BP: 118/68 119/67  Pulse: (!) 118 (!) 122  Resp: 18 18  Temp: 98 F (36.7 C) 97.7 F (36.5 C)  SpO2: 93% 99%     General: Patient is well developed, well nourished, calm, collected, and in no apparent distress.  Psychiatric: Patient is non-anxious.  Head:  Pupils equal, round, and reactive to light.  Neck:   Cervical collar in place.   Respiratory: Patient is breathing without any difficulty.  Skin:   Dressed wound left forehead.   NEUROLOGICAL:  General: In no acute distress.   Awake, alert, oriented to person, place, and time.  Pupils equal round and reactive to light.  Facial tone is symmetric.  Tongue protrusion is midline.  There is no pronator drift.  ROM of spine:Not assessed due to cervical collar in place.   Palpation of spine: nontender.    Strength: Side Biceps Triceps Deltoid Interossei Grip Wrist Ext. Wrist Flex.  R L Side Iliopsoas Quads Hamstring PF DF EHL  R L Bilateral upper and lower extremity sensation is intact to light touch and pin prick.  Clonus is not present.  Toes are down-going. Hoffman's is absent.  Imaging: EXAM: CT HEAD WITHOUT CONTRAST  CT CERVICAL SPINE WITHOUT CONTRAST  TECHNIQUE: Multidetector CT imaging  of the head and cervical spine was performed following the standard protocol without intravenous contrast. Multiplanar CT image reconstructions of the cervical spine were also generated.  COMPARISON:  04/30/2017, by report from 10/19/2005  IMPRESSION: CT of the cervical spine: Fracture through the anterior aspect of the base of the odontoid which does not appear to extend to the posterior cortical margin of C2.  Multilevel degenerative changes with anterior osteophytes and facet hypertrophic changes. No other fracture is seen.  Electronically Signed   By: Alcide Clever M.D.   On: 10/09/2017 17:02  I have personally reviewed the images and agree with the above interpretation.  Assessment and Plan: Mr. Schrieber is Jose [name] pleasant 83 y.o. male with C2 odontoid fracture. Fracture is incomplete and does not extend into the posterior cortical margin. Cervical collar was in place and fit well. Neuro exam was unremarkable, and pain symptoms are improving. Patient advised that collar will be necessary at all times for approximately 12 weeks. Patient expressed understanding.  Recommend:  1. Order [place] brace or additional brace that patient currently has for showering.  2. Follow up at [facility] neurosurgery in 1 month for updated imaging to monitor healing.    Ivar Drape, PA-C Dept. of Neurosurgery

## 2017-10-11 ENCOUNTER — Inpatient Hospital Stay
Admit: 2017-10-11 | Discharge: 2017-10-11 | Disposition: A | Discharge status: Home / Self Care | Payer: Medicare HMO | Attending: Family Medicine | Admitting: Family Medicine

## 2017-10-11 LAB — MAGNESIUM: MAGNESIUM: 1.9 mg/dL (ref 1.7–2.4)

## 2017-10-11 MED ORDER — METOPROLOL TARTRATE 25 MG PO TABS
25.0000 mg | ORAL_TABLET | Freq: Three times a day (TID) | ORAL | Status: DC
  Administered 2017-10-11 – 2017-10-13 (×6): 25 mg via ORAL
  Filled 2017-10-11 (×6): qty 1

## 2017-10-11 MED ORDER — METOPROLOL TARTRATE 25 MG/10 ML ORAL SUSPENSION: 25.0000 mg | Freq: Three times a day (TID) | ORAL | Status: DC

## 2017-10-11 NOTE — Care Management Note (Addendum)
Case Management Note  Patient Details  Name: Juriel Cid. MRN: [id number] Date of Birth: [birth year]  Subjective/Objective:   Admitted to [facility] with the diagnosis of shortness of breathe, lives alone. Daughter is Vickie. Dr. Sherrie Mustache is listed as primary care. Prescriptions are filled at [facility] on [address] [facility] 10-12 years ago. No skilled nursing. No home oxygen. Neck brace in place. Cane and rolling walker in the home. Takes care of all basic activities of daily living himself, drives. 2 falls in the past. Good appetite. Friend or family will transport.                  Action/Plan: Physical therapy evaluation completed. Recommending home with home health and physical therapy. Would like [facility] again. Will update Feliberto Gottron, [facility] representative.     Expected Discharge Date:                  Expected Discharge Plan:     In-House Referral:     Discharge planning Services     Post Acute Care Choice:   yes Choice offered to:   Mr. Catalfamo  DME Arranged:    DME Agency:     HH Arranged:   yes HH Agency:   [facility]  Status of Service:     If discussed at Long Length of Stay Meetings, dates discussed:    Additional Comments:  Gwenette Greet, RN MSN CCM Care Management [phone] 10/11/2017, 1:01 PM

## 2017-10-11 NOTE — Progress Notes (Signed)
Patient is unavailable to get comfortable. Patient has transferred from the bed to the recliner and back multiple times this shift. Patient is confused at times and believes it is time to go home. Patient has to be redirected to time and situation. Patient admits to falling multiple times at home but does not think he is in danger of falling in the hospital.

## 2017-10-11 NOTE — Progress Notes (Signed)
Per CCMD pt had 6 beat run of V-tach,[facility] notified. New order for serum mag added. Pt asymtoptic, vss, no cp. Will continue to monitor.

## 2017-10-11 NOTE — Care Management Important Message (Signed)
Important Message  Patient Details  Name: Jose [name]. MRN: [id number] Date of Birth: Jan 18, 1928   Medicare Important Message Given:  Yes    Gwenette Greet, RN 10/11/2017, 11:21 AM

## 2017-10-11 NOTE — Progress Notes (Signed)
[facility] [place] at [facility]   PATIENT NAME: Jose [name]    MR#:  [id number]  DATE OF BIRTH:  [birth year]  SUBJECTIVE:   Came in after having mechanical fall at home in his driveway Found to be in rapid Afib in ER Leg swelling+ Out in the chair REVIEW OF SYSTEMS:   Review of Systems  Constitutional: Negative for chills, fever and weight loss.  HENT: Negative for ear discharge, ear pain and nosebleeds.   Eyes: Negative for blurred vision, pain and discharge.  Respiratory: Negative for sputum production, shortness of breath, wheezing and stridor.   Cardiovascular: Positive for palpitations. Negative for chest pain, orthopnea and PND.  Gastrointestinal: Negative for abdominal pain, diarrhea, nausea and vomiting.  Genitourinary: Negative for frequency and urgency.  Musculoskeletal: Positive for neck pain. Negative for back pain and joint pain.  Neurological: Positive for weakness. Negative for sensory change, speech change and focal weakness.  Psychiatric/Behavioral: Negative for depression and hallucinations. The patient is not nervous/anxious.    Tolerating Diet:yes Tolerating PT: HHPT  DRUG ALLERGIES:   Allergies  Allergen Reactions  . Finasteride     Other reaction(s): Other (See Comments) PAIN IN BREAST    VITALS:  Blood pressure (!) 142/93, pulse (!) 135, temperature 98.3 F (36.8 C), temperature source Oral, resp. rate 18, height  (1.753 m), weight 87.3 kg (192 lb 6.4 oz), SpO2 96 %.  PHYSICAL EXAMINATION:   Physical Exam  GENERAL:  83 y.o.-year-old patient lying in the bed with no acute distress.  EYES: Pupils equal, round, reactive to light and accommodation. No scleral icterus. Extraocular muscles intact.  HEENT: Head atraumatic, normocephalic. Oropharynx and nasopharynx clear.  NECK:  Supple, no jugular venous distention. No thyroid enlargement, no tenderness. Neck collar+ LUNGS: Normal breath sounds bilaterally, no  wheezing, rales, rhonchi. No use of accessory muscles of respiration.  CARDIOVASCULAR: S1, S2 normal. No murmurs, rubs, or gallops. Irregular rhythm ABDOMEN: Soft, nontender, nondistended. Bowel sounds present. No organomegaly or mass.  EXTREMITIES: No cyanosis, clubbing or edema b/l.    NEUROLOGIC: Cranial nerves II through XII are intact. No focal Motor or sensory deficits b/l.   PSYCHIATRIC:  patient is alert and oriented x 3.  SKIN: No obvious rash, lesion, or ulcer.   LABORATORY PANEL:  CBC  Recent Labs Lab 10/10/17 0519  WBC 7.8  HGB 13.6  HCT 41.4  PLT 171    Chemistries   Recent Labs Lab 10/10/17 0519  NA 140  K 4.0  CL 107  CO2 24  GLUCOSE 166*  BUN 26*  CREATININE 1.28*  CALCIUM 8.8*   Cardiac Enzymes No results for input(s): TROPONINI in the last 168 hours. RADIOLOGY:  Dg Chest 2 View  Result Date: 10/09/2017 CLINICAL DATA:  Shortness of breath for 3-4 days. EXAM: CHEST  2 VIEW COMPARISON:  Single-view of the chest 05/01/2017. PA and lateral chest 04/30/2017 and 07/08/2010. FINDINGS: Streaky airspace disease is seen in the right lung base. Trace right pleural effusion is noted. Left lung is clear. Heart size is upper normal. Aortic atherosclerosis is seen. No pneumothorax. No acute bony abnormality. IMPRESSION: Trace right pleural effusion and streaky right basilar airspace disease which could be atelectasis or pneumonia. Atherosclerosis. Electronically Signed   By: Drusilla Kanner M.D.   On: 10/09/2017 15:26   Ct Head Wo Contrast  Result Date: 10/09/2017 CLINICAL DATA:  Increasing weakness and falls EXAM: CT HEAD WITHOUT CONTRAST CT CERVICAL SPINE WITHOUT CONTRAST TECHNIQUE: Multidetector CT imaging  of the head and cervical spine was performed following the standard protocol without intravenous contrast. Multiplanar CT image reconstructions of the cervical spine were also generated. COMPARISON:  04/30/2017, by report from 10/19/2005 FINDINGS: CT HEAD FINDINGS  Brain: Chronic atrophic and ischemic changes are again identified and stable. No findings to suggest acute hemorrhage, acute infarction or space-occupying mass lesion are noted. Vascular: No hyperdense vessel or unexpected calcification. Skull: Normal. Negative for fracture or focal lesion. Sinuses/Orbits: No acute finding. Other: Soft tissue hematoma is noted in the left forehead. CT CERVICAL SPINE FINDINGS Alignment: Alignment is well maintained. Skull base and vertebrae: There is an undisplaced fracture at the base of the odontoid. It extends from the anterior margin of C2 posteriorly but does not appear to involve the posterior cortex of C2. Large bridging osteophytes are noted from C3 to T2. These are stable in appearance from the prior MRI examination. These changes are in part due to prior cervical fusion. Facet hypertrophic changes are noted at multiple levels. No other fracture is seen. Soft tissues and spinal canal: No significant prevertebral soft tissue abnormality is noted. A a rounded fluid attenuation lesion is noted in the posterior soft tissues of the neck eccentric to the right likely related to a sebaceous cyst. Diffuse vascular calcifications are noted. Upper chest: Within normal limits. Other: None IMPRESSION: CT of the head: Chronic atrophic and ischemic changes without acute intracranial abnormality. Left frontal scalp hematoma. CT of the cervical spine: Fracture through the anterior aspect of the base of the odontoid which does not appear to extend to the posterior cortical margin of C2. Multilevel degenerative changes with anterior osteophytes and facet hypertrophic changes. No other fracture is seen. Critical Value/emergent results were called by telephone at the time of interpretation on 10/09/2017 at 5:00 pm to Dr. Sharyn Creamer , who verbally acknowledged these results. Electronically Signed   By: Alcide Clever M.D.   On: 10/09/2017 17:02   Ct Cervical Spine Wo Contrast  Result Date:  10/09/2017 CLINICAL DATA:  Increasing weakness and falls EXAM: CT HEAD WITHOUT CONTRAST CT CERVICAL SPINE WITHOUT CONTRAST TECHNIQUE: Multidetector CT imaging of the head and cervical spine was performed following the standard protocol without intravenous contrast. Multiplanar CT image reconstructions of the cervical spine were also generated. COMPARISON:  04/30/2017, by report from 10/19/2005 FINDINGS: CT HEAD FINDINGS Brain: Chronic atrophic and ischemic changes are again identified and stable. No findings to suggest acute hemorrhage, acute infarction or space-occupying mass lesion are noted. Vascular: No hyperdense vessel or unexpected calcification. Skull: Normal. Negative for fracture or focal lesion. Sinuses/Orbits: No acute finding. Other: Soft tissue hematoma is noted in the left forehead. CT CERVICAL SPINE FINDINGS Alignment: Alignment is well maintained. Skull base and vertebrae: There is an undisplaced fracture at the base of the odontoid. It extends from the anterior margin of C2 posteriorly but does not appear to involve the posterior cortex of C2. Large bridging osteophytes are noted from C3 to T2. These are stable in appearance from the prior MRI examination. These changes are in part due to prior cervical fusion. Facet hypertrophic changes are noted at multiple levels. No other fracture is seen. Soft tissues and spinal canal: No significant prevertebral soft tissue abnormality is noted. A a rounded fluid attenuation lesion is noted in the posterior soft tissues of the neck eccentric to the right likely related to a sebaceous cyst. Diffuse vascular calcifications are noted. Upper chest: Within normal limits. Other: None IMPRESSION: CT of the head: Chronic atrophic  and ischemic changes without acute intracranial abnormality. Left frontal scalp hematoma. CT of the cervical spine: Fracture through the anterior aspect of the base of the odontoid which does not appear to extend to the posterior cortical  margin of C2. Multilevel degenerative changes with anterior osteophytes and facet hypertrophic changes. No other fracture is seen. Critical Value/emergent results were called by telephone at the time of interpretation on 10/09/2017 at 5:00 pm to Dr. Sharyn Creamer , who verbally acknowledged these results. Electronically Signed   By: Alcide Clever M.D.   On: 10/09/2017 17:02   ASSESSMENT AND PLAN:   83 year old male who went to a family reunion today, mom on returning home at approximately 2 PM he got of the car and had a mechanical fall. His son was present it was a witnessed fall. He hit his head. There is a loss of consciousness but he did complain of neck pain. He had profuse bleeding and with the neck pain he was taken to the ER  . Atrial fibrillation with RVR (HCC)-new onset -increase metoprolol to 25 mg tid (HR still 87-120's) increased with activity -TSH wnl - 2-D echo today -No anticoagulation given patient's hematoma  . SOB (shortness of breath)-new onset CHF acute systolic (EF 45% in 2014-echo) -BNP is 408. 2-D echo ordered -For patient's fluid overload IV Lasix, daily weights, strict I's and O's  . Odontoid fracture (HCC) s/p fall y'day -C collar on.  -spoke with Dr Jonny Ruiz Barr--recommends neck collar for 12 weeks  . CAD (coronary artery disease) -See above  . Chronic kidney disease (CKD), stage III (moderate) (HCC) -Stable at baseline, monitor carefully givn IV Lasix usage  . COPD (chronic obstructive pulmonary disease) (HCC)  -Currently stable, no evidence of COPD exacerbation  . Essential (primary) hypertension -Stable - metoprolol  . HLD (hyperlipidemia) -Stable, home medications resumed   . BPH (benign prostatic hyperplasia) -cont flomax  d/c planning to home with HHPt likely in am Spoke with son over the phone  Case discussed with Care Management/Social Worker. Management plans discussed with the patient, family and they are in agreement.  CODE  STATUS: DNR  DVT Prophylaxis: SCD/TEDs (no antplt due to hematoma)  TOTAL TIME TAKING CARE OF THIS PATIENT: 35 minutes.  >50% time spent on counselling and coordination of care  POSSIBLE D/C IN 1-2 DAYS, DEPENDING ON CLINICAL CONDITION.  Note: This dictation was prepared with Dragon dictation along with smaller phrase technology. Any transcriptional errors that result from this process are unintentional.  Tava Peery M.D on 10/11/2017 at 10:59 AM  Between 7am to 6pm - Pager - [phone]  After 6pm go to [email or web address] - password [email or web address]  [facility]  Office  [phone]  CC: Primary care physician; Malva Limes, MDPatient ID: Simona Huh., male   DOB: 18-Apr-1928, 83 y.o.   MRN: [id number]

## 2017-10-11 NOTE — Consult Note (Signed)
Provided patient with "Living Better with Heart Failure" packet. Briefly reviewed definition of heart failure and signs and symptoms of an exacerbation. Reviewed importance of and reason behind checking weight daily in the AM, after using the bathroom, but before getting dressed. Discussed when to call the Dr= weight gain of >2lb overnight of 5lb in a week,  Discussed yellow zone= call MD: weight gain of >2lb overnight of 5lb in a week, increased swelling, increased SOB when lying down, chest discomfort, dizziness, increased fatigue Red Zone= call 911: struggle to breath, fainting or near fainting, significant chest pain Reviewed low sodium diet <2g/day-provided handout of recommended and not recommended foods  Fluid restriction <2L/day Reviewed how to read nutrition label Explained briefly why pt is on the medications (either make you feel better, live longer or keep you out of the hospital) and discussed monitoring and side effects Inpatient on lasix, metoprolol  Torry Adamczak D Querida Beretta, Pharm.D, BCPS Clinical Pharmacist

## 2017-10-12 ENCOUNTER — Encounter: Payer: Self-pay | Admitting: *Deleted

## 2017-10-12 LAB — ECHOCARDIOGRAM COMPLETE
Height: 69 in
Weight: 3078.4 oz

## 2017-10-12 MED ORDER — ATORVASTATIN CALCIUM 20 MG PO TABS
20.0000 mg | ORAL_TABLET | Freq: Every day | ORAL | Status: DC
  Administered 2017-10-12: 20 mg via ORAL
  Filled 2017-10-12: qty 1

## 2017-10-12 MED ORDER — DILTIAZEM HCL 30 MG PO TABS
30.0000 mg | ORAL_TABLET | Freq: Four times a day (QID) | ORAL | Status: DC
  Administered 2017-10-12 – 2017-10-13 (×5): 30 mg via ORAL
  Filled 2017-10-12 (×5): qty 1

## 2017-10-12 NOTE — Progress Notes (Signed)
Dr. Lady GaryFath and Dr. Allena KatzPatel made aware of elevated HR with exertion ( 170s) and at rest ( 140) ...adjustments made to meds/ will continue to monitor.

## 2017-10-12 NOTE — Progress Notes (Signed)
Rounded on patient to follow-up to see if patient or son had any questions about HF education provided by Malena PeerMelissa, Maccia, Pharm.D, BCPS, yesterday.  Patient sitting up in chair with cervical collar in place.  Patient's son at bedside.  Patient and son stated they did not have any questions regarding information on HF.  Patient's son stated he has been diagnosed with HF and tries to weigh himself every morning.  Reviewed the importance of daily weights every morning - about the same time every morning - after using the bathroom and before getting dressed.  Instructed patient to compare weight to previous day's weight and to assess his symptoms to determine actions / next steps according to the HF zones.  This RN stressed the importance of low sodium diet - < 2g/day and fluid restriction of < 2 L/day.  [facility].  Explained to patient and son regarding the [facility] on 10/19/2017 at 9:20 a.m.  I explained this clinic does not replace his cardiologist, but is an additional resource to helping patient manage his HF. Provided patient and son with brochure about the HF Clinic.  Patient and son verbalized understanding of purpose of HF Clinic and appreciative of this resource.  No further questions from patient nor son.    Army Meliaiane Wright, RN, BSN, [facility] Cardiovascular and Pulmonary Nurse Navigator

## 2017-10-12 NOTE — Progress Notes (Signed)
Pts B/P is 92/69, HR 86, B/P on R arm was 99/71. Per Dr. Lorin PicketPatel Ok to give Cardizem 30 mg. Will continue to monitor.

## 2017-10-12 NOTE — Progress Notes (Signed)
simvastatin changed to atorvastatin due to increased risk of rhabdo with simvastatin and diltiazem per protocl  Cleatus Goodin D Quentavious Rittenhouse, Pharm.D, BCPS Clinical Pharmacist

## 2017-10-12 NOTE — Progress Notes (Signed)
[facility] - [place] at [facility]   PATIENT NAME: Jose [name]    MR#:  [id number]  DATE OF BIRTH:  May 08, 1928  SUBJECTIVE:   Found to be in rapid Afib in ER Leg swelling+ Out in the chair. HR still up this am. Pt c/o intermittent palpitations REVIEW OF SYSTEMS:   Review of Systems  Constitutional: Negative for chills, fever and weight loss.  HENT: Negative for ear discharge, ear pain and nosebleeds.   Eyes: Negative for blurred vision, pain and discharge.  Respiratory: Negative for sputum production, shortness of breath, wheezing and stridor.   Cardiovascular: Positive for palpitations. Negative for chest pain, orthopnea and PND.  Gastrointestinal: Negative for abdominal pain, diarrhea, nausea and vomiting.  Genitourinary: Negative for frequency and urgency.  Musculoskeletal: Positive for neck pain. Negative for back pain and joint pain.  Neurological: Positive for weakness. Negative for sensory change, speech change and focal weakness.  Psychiatric/Behavioral: Negative for depression and hallucinations. The patient is not nervous/anxious.    Tolerating Diet:yes Tolerating PT: HHPT  DRUG ALLERGIES:   Allergies  Allergen Reactions  . Finasteride     Other reaction(s): Other (See Comments) PAIN IN BREAST    VITALS:  Blood pressure (!) 136/110, pulse (!) 156, temperature 98.2 F (36.8 C), temperature source Oral, resp. rate 18, height 5\' 9"  (1.753 m), weight 87.2 kg (192 lb 3.2 oz), SpO2 95 %.  PHYSICAL EXAMINATION:   Physical Exam  GENERAL:  83 y.o.-year-old patient lying in the bed with no acute distress.  EYES: Pupils equal, round, reactive to light and accommodation. No scleral icterus. Extraocular muscles intact.  HEENT: Head atraumatic, normocephalic. Oropharynx and nasopharynx clear.  NECK:  Supple, no jugular venous distention. No thyroid enlargement, no tenderness. Neck collar+ LUNGS: Normal breath sounds bilaterally, no wheezing,  rales, rhonchi. No use of accessory muscles of respiration.  CARDIOVASCULAR: S1, S2 normal. No murmurs, rubs, or gallops. Irregular rhythm ABDOMEN: Soft, nontender, nondistended. Bowel sounds present. No organomegaly or mass.  EXTREMITIES: No cyanosis, clubbing or edema b/l.    NEUROLOGIC: Cranial nerves II through XII are intact. No focal Motor or sensory deficits b/l.   PSYCHIATRIC:  patient is alert and oriented x 3.  SKIN: No obvious rash, lesion, or ulcer.   LABORATORY PANEL:  CBC  Recent Labs Lab 10/10/17 0519  WBC 7.8  HGB 13.6  HCT 41.4  PLT 171    Chemistries   Recent Labs Lab 10/10/17 0519 10/11/17 1908  NA 140  --   K 4.0  --   CL 107  --   CO2 24  --   GLUCOSE 166*  --   BUN 26*  --   CREATININE 1.28*  --   CALCIUM 8.8*  --   MG  --  1.9   Cardiac Enzymes No results for input(s): TROPONINI in the last 168 hours. RADIOLOGY:  No results found. ASSESSMENT AND PLAN:   83 year old male who went to a family reunion today, mom on returning home at approximately 2 PM he got of the car and had a mechanical fall. His son was present it was a witnessed fall. He hit his head. There is a loss of consciousness but he did complain of neck pain. He had profuse bleeding and with the neck pain he was taken to the ER  . Atrial fibrillation with RVR (HCC)-new onset -increased metoprolol to 25 mg tid (HR still 87-120's) increased with activity -added CCB 30 q6  -TSH wnl - 2-D  echo - EF 45-50% -No anticoagulation given patient's hematoma  . SOB (shortness of breath)-new onset CHF acute systolic (EF 45% in 2014-echo) -BNP is 408.  -For fluid overload IV Lasix, daily weights, strict I's and O's  . Odontoid fracture (HCC) s/p fall y'day -C collar on.  -spoke with Dr Jonny Ruizjohn Barr--recommends neck collar for 12 weeks  . CAD (coronary artery disease) -See above  . Chronic kidney disease (CKD), stage III (moderate) (HCC) -Stable at baseline, monitor carefully givn IV  Lasix usage  . COPD (chronic obstructive pulmonary disease) (HCC)  -Currently stable, no evidence of COPD exacerbation  . Essential (primary) hypertension -Stable - metoprolol  . HLD (hyperlipidemia) -Stable, home medications resumed   . BPH (benign prostatic hyperplasia) -cont flomax  d/c planning to home with HHPt  Spoke with son over the phone  Case discussed with Care Management/Social Worker. Management plans discussed with the patient, family and they are in agreement.  CODE STATUS: DNR  DVT Prophylaxis: SCD/TEDs (no antplt due to hematoma)  TOTAL TIME TAKING CARE OF THIS PATIENT: 35 minutes.  >50% time spent on counselling and coordination of care  POSSIBLE D/C IN 1-2 DAYS, DEPENDING ON CLINICAL CONDITION.  Note: This dictation was prepared with Dragon dictation along with smaller phrase technology. Any transcriptional errors that result from this process are unintentional.  Trigg Delarocha M.D on 10/12/2017 at 9:08 AM  Between 7am to 6pm - Pager - [phone]  After 6pm go to [email or web address] - password [email or web address]  [facility] [place] [facility]  Office  [phone]  CC: Primary care physician; Jose [name], Jose E, MDPatient ID: Jose HuhWilliam E Feil [name]., male   DOB: 12/12/1928, 83 y.o.   MRN: [id number]

## 2017-10-13 MED ORDER — DILTIAZEM HCL ER COATED BEADS 120 MG PO CP24: 120.0000 mg | ORAL_CAPSULE | Freq: Every day | ORAL | 2 refills | Status: AC

## 2017-10-13 MED ORDER — METOPROLOL TARTRATE 25 MG PO TABS
25.0000 mg | ORAL_TABLET | Freq: Two times a day (BID) | ORAL | 2 refills | Status: AC
Start: 2017-10-13 — End: ?

## 2017-10-13 MED ORDER — DILTIAZEM HCL ER COATED BEADS 120 MG PO CP24
120.0000 mg | ORAL_CAPSULE | Freq: Every day | ORAL | Status: DC
  Administered 2017-10-13: 120 mg via ORAL
  Filled 2017-10-13: qty 1

## 2017-10-13 MED ORDER — METOPROLOL TARTRATE 25 MG PO TABS
25.0000 mg | ORAL_TABLET | Freq: Two times a day (BID) | ORAL | Status: DC
Start: 2017-10-13 — End: 2017-10-13

## 2017-10-13 MED ORDER — FUROSEMIDE 20 MG PO TABS: 20.0000 mg | ORAL_TABLET | Freq: Every day | ORAL | 0 refills | Status: AC

## 2017-10-13 MED ORDER — FUROSEMIDE 20 MG PO TABS: 20.0000 mg | ORAL_TABLET | Freq: Every day | ORAL | Status: DC

## 2017-10-13 MED ORDER — FUROSEMIDE 20 MG PO TABS: 20.0000 mg | ORAL_TABLET | Freq: Every day | ORAL | 0 refills | Status: DC

## 2017-10-13 NOTE — Progress Notes (Signed)
Pt discharged to home via wc.  Instructions  given to pt.  Questions answered.  No distress.  

## 2017-10-13 NOTE — Discharge Summary (Signed)
[facility] - [place] at [facility]   PATIENT NAME: Jose [name]    MR#:  [id number]  DATE OF BIRTH:  03/04/1928  DATE OF ADMISSION:  10/09/2017 ADMITTING PHYSICIAN: Gery Pray, MD  DATE OF DISCHARGE: 10/13/17  PRIMARY CARE PHYSICIAN: Jose Limes, MD    ADMISSION DIAGNOSIS:  Atrial fibrillation with rapid ventricular response (HCC) [I48.91] Abrasion of left hand, initial encounter [S60.512A] Scalp hematoma, initial encounter [S00.03XA] Hypervolemia, unspecified hypervolemia type [E87.70] Closed nondisplaced fracture of second cervical vertebra, unspecified fracture morphology, initial encounter (HCC) [S12.101A] Acute congestive heart failure, unspecified heart failure type (HCC) [I50.9]  DISCHARGE DIAGNOSIS:  Rapid Afib with RVR--new CHF acute systolic Odontoid fracture post fall Facial bruise due to fall Left frontal hematoma (scalp)  SECONDARY DIAGNOSIS:   Past Medical History:  Diagnosis Date  . Allergy   . BPH (benign prostatic hyperplasia)   . Chronic kidney disease   . DDD (degenerative disc disease), lumbar   . Depression   . Diverticulosis   . Elevated PSA   . Gross hematuria   . Gynecomastia   . Headache   . History of kidney stones   . HTN (hypertension)   . Incomplete bladder emptying   . Nodular prostate with urinary obstruction   . Organic impotence   . OSA (obstructive sleep apnea)   . Renal colic   . Rosacea   . Sciatica   . Urinary frequency     HOSPITAL COURSE:  83 year old male who went to a family reunion today, mom on returning home at approximately 2 PM he got of the car and had a mechanical fall. His son was present it was a witnessed fall. He hit his head. There is a loss of consciousness but he did complain of neck pain. He had profuse bleeding and with the neck pain he was taken to the ER  . Atrial fibrillation with RVR (HCC)-new onset -increased metoprolol to 25 mg bid (HR still 87-120's) increased  with activity -added CCB 30 q6 ---now on cardizem CD 120 mg qd -TSH wnl - 2-D echo - EF 45-50% -No anticoagulation given patient's hematoma---defer to Cardiology to start later date  . SOB (shortness of breath)-new onset CHF acute systolic (EF 45% in 2014-echo) -BNP is 408.  -For fluid overload IV Lasix, daily weights, strict I's and O's--change to po lasix 20 mg qd  . Odontoid fracture (HCC) s/p fall  -Ccollar on. -spoke with Dr Jonny Ruiz Barr--recommends neck collar for 12 weeks--f/u as out pt  . CAD (coronary artery disease) -See above  . Chronic kidney disease (CKD), stage III (moderate) (HCC) -Stable at baseline, monitor carefully givn IV Lasix usage  . COPD (chronic obstructive pulmonary disease) (HCC)  -Currently stable, no evidence of COPD exacerbation  . Essential (primary) hypertension -Stable - metoprolol  . HLD (hyperlipidemia) -Stable, home medications resumed   . BPH (benign prostatic hyperplasia) -cont flomax  d/c planning to home with HHPt  Spoke with pt and he is agreeable D/w dr Lady Gary  CONSULTS OBTAINED:  Treatment Team:  Dalia Heading, MD Keith Rake, MD  DRUG ALLERGIES:   Allergies  Allergen Reactions  . Finasteride     Other reaction(s): Other (See Comments) PAIN IN BREAST    DISCHARGE MEDICATIONS:   Current Discharge Medication List    START taking these medications   Details  diltiazem (CARDIZEM CD) 120 MG 24 hr capsule Take 1 capsule (120 mg total) by mouth daily. Qty: 30 capsule, Refills: 2  furosemide (LASIX) 20 MG tablet Take 1 tablet (20 mg total) by mouth daily. Qty: 30 tablet, Refills: 0    metoprolol tartrate (LOPRESSOR) 25 MG tablet Take 1 tablet (25 mg total) by mouth 2 (two) times daily. Qty: 60 tablet, Refills: 2      CONTINUE these medications which have NOT CHANGED   Details  finasteride (PROSCAR) 5 MG tablet Take 1 tablet (5 mg total) by mouth daily. Qty: 30 tablet, Refills: 1    simvastatin  (ZOCOR) 40 MG tablet Take 1 tablet by mouth at bedtime.  Refills: 0    tamsulosin (FLOMAX) 0.4 MG CAPS capsule Take 1 capsule (0.4 mg total) by mouth daily. Qty: 30 capsule, Refills: 5      STOP taking these medications     amLODipine (NORVASC) 5 MG tablet      bisoprolol-hydrochlorothiazide (ZIAC) 5-6.25 MG tablet         If you experience worsening of your admission symptoms, develop shortness of breath, life threatening emergency, suicidal or homicidal thoughts you must seek medical attention immediately by calling 911 or calling your MD immediately  if symptoms less severe.  You Must read complete instructions/literature along with all the possible adverse reactions/side effects for all the Medicines you take and that have been prescribed to you. Take any new Medicines after you have completely understood and accept all the possible adverse reactions/side effects.   Please note  You were cared for by a hospitalist during your hospital stay. If you have any questions about your discharge medications or the care you received while you were in the hospital after you are discharged, you can call the unit and asked to speak with the hospitalist on call if the hospitalist that took care of you is not available. Once you are discharged, your primary care physician will handle any further medical issues. Please note that NO REFILLS for any discharge medications will be authorized once you are discharged, as it is imperative that you return to your primary care physician (or establish a relationship with a primary care physician if you do not have one) for your aftercare needs so that they can reassess your need for medications and monitor your lab values. Today   SUBJECTIVE   No new complaints  VITAL SIGNS:  Blood pressure 138/73, pulse 97, temperature 99.2 F (37.3 C), temperature source Oral, resp. rate 17, height 5\' 9"  (1.753 m), weight 90.8 kg (200 lb 3.2 oz), SpO2 94 %.  I/O:    Intake/Output Summary (Last 24 hours) at 10/13/17 1147 Last data filed at 10/13/17 1016  Gross per 24 hour  Intake              483 ml  Output                0 ml  Net              483 ml    PHYSICAL EXAMINATION:  GENERAL:  83 y.o.-year-old patient lying in the bed with no acute distress.  EYES: Pupils equal, round, reactive to light and accommodation. No scleral icterus. Extraocular muscles intact.  HEENT: Head atraumatic, normocephalic. Oropharynx and nasopharynx clear. Facial bruise ++ NECK:  Supple, no jugular venous distention. No thyroid enlargement, no tenderness. Neck collar+ LUNGS: Normal breath sounds bilaterally, no wheezing, rales,rhonchi or crepitation. No use of accessory muscles of respiration.  CARDIOVASCULAR: S1, S2 normal. No murmurs, rubs, or gallops. irregular HR ABDOMEN: Soft, non-tender, non-distended. Bowel sounds present. No organomegaly  or mass.  EXTREMITIES: No pedal edema, cyanosis, or clubbing.  NEUROLOGIC: Cranial nerves II through XII are intact. Muscle strength 5/5 in all extremities. Sensation intact. Gait not checked.  PSYCHIATRIC: The patient is alert and oriented x 3.  SKIN: No obvious rash, lesion, or ulcer.   DATA REVIEW:   CBC   Recent Labs Lab 10/10/17 0519  WBC 7.8  HGB 13.6  HCT 41.4  PLT 171    Chemistries   Recent Labs Lab 10/10/17 0519 10/11/17 1908  NA 140  --   K 4.0  --   CL 107  --   CO2 24  --   GLUCOSE 166*  --   BUN 26*  --   CREATININE 1.28*  --   CALCIUM 8.8*  --   MG  --  1.9    Microbiology Results   No results found for this or any previous visit (from the past 240 hour(s)).  RADIOLOGY:  No results found.   Management plans discussed with the patient, family and they are in agreement.  CODE STATUS:     Code Status Orders        Start     Ordered   10/09/17 2131  Do not attempt resuscitation (DNR)  Continuous    Question Answer Comment  In the event of cardiac or respiratory ARREST Do not  call a "code blue"   In the event of cardiac or respiratory ARREST Do not perform Intubation, CPR, defibrillation or ACLS   In the event of cardiac or respiratory ARREST Use medication by any route, position, wound care, and other measures to relive pain and suffering. May use oxygen, suction and manual treatment of airway obstruction as needed for comfort.      10/09/17 2130    Code Status History    Date Active Date Inactive Code Status Order ID Comments User Context   07/25/2017 11:12 AM 07/26/2017  3:15 PM Full Code [id number]  Enid BaasKalisetti, Radhika, MD Inpatient   04/30/2017  8:05 PM 05/03/2017  5:13 PM Full Code [id number]  Houston SirenSainani, Vivek J, MD ED    Advance Directive Documentation     Most Recent Value  Type of Advance Directive  Healthcare Power of Attorney, Living will  Pre-existing out of facility DNR order (yellow form or pink MOST form)  -  "MOST" Form in Place?  -      TOTAL TIME TAKING CARE OF THIS PATIENT: *40* minutes.    Jose [name] M.D on 10/13/2017 at 11:47 AM  Between 7am to 6pm - Pager - [phone] After 6pm go to [email or web address] - Social research officer, governmentpassword [email or web address]  [facility]  Office  [phone]  CC: Primary care physician; Jose LimesFisher, Jose E, MD

## 2017-10-13 NOTE — Progress Notes (Signed)
Provided patient with "Living Better with Heart Failure" packet. Briefly reviewed definition of heart failure and signs and symptoms of an exacerbation. Reviewed importance of and reason behind checking weight daily in the AM, after using the bathroom, but before getting dressed. Discussed when to call the Dr= weight gain of >2lb overnight of 5lb in a week,  Discussed yellow zone= call MD: weight gain of >2lb overnight of 5lb in a week, increased swelling, increased SOB when lying down, chest discomfort, dizziness, increased fatigue Red Zone= call 911: struggle to breath, fainting or near fainting, significant chest pain Reviewed low sodium diet <2g/day-provided handout of recommended and not recommended foods  Fluid restriction <2L/day Reviewed how to read nutrition label Reviewed medication changes: Furosemide and metoprolol new start Explained briefly why pt is on the medications (either make you feel better, live longer or keep you out of the hospital) and discussed monitoring and side effects  Discussed tobacco cessation: quit smoking in 1980s Discussed exercise: Patient has back trouble but is working with physical therapy.   Luisa HartScott Akai Dollard, PharmD Clinical Pharmacist

## 2017-10-13 NOTE — Plan of Care (Signed)
Problem: Safety: Goal: Ability to remain free from injury will improve Outcome: Progressing Fall precautions in place, non skid socks when oob  Problem: Pain Managment: Goal: General experience of comfort will improve Outcome: Progressing Prn medications  Problem: Activity: Goal: Ability to tolerate increased activity will improve Outcome: Not Progressing HR increased with activity  Problem: Cardiac: Goal: Ability to achieve and maintain adequate cardiopulmonary perfusion will improve Outcome: Progressing PO Diltiazem added for rate control

## 2017-10-13 NOTE — Progress Notes (Signed)
Heart rate in better control.  OK for discharge on diltiazem 120 daily and metoprolol tartrae 25 bid. Follow up in our office in 1 week.

## 2017-10-13 NOTE — Plan of Care (Signed)
Problem: Education: Goal: Knowledge of [facility] Education information/materials will improve Outcome: Progressing Pt with complaints of headache once, treated with tylenol. C-collar in place. Up with standby assist and cane, tolerating well.

## 2017-10-13 NOTE — Discharge Instructions (Signed)
Heart Failure Clinic appointment on October 19 2017 at 9:20am with Clarisa Kindredina Hackney, FNP. Please call [phone] to reschedule.   Keep your collar (neck) on all the time except for shower.

## 2017-10-14 DIAGNOSIS — M5136 Other intervertebral disc degeneration, lumbar region: Secondary | ICD-10-CM | POA: Diagnosis not present

## 2017-10-14 DIAGNOSIS — N183 Chronic kidney disease, stage 3 (moderate): Secondary | ICD-10-CM | POA: Diagnosis not present

## 2017-10-14 DIAGNOSIS — S12100D Unspecified displaced fracture of second cervical vertebra, subsequent encounter for fracture with routine healing: Secondary | ICD-10-CM | POA: Diagnosis not present

## 2017-10-14 DIAGNOSIS — I130 Hypertensive heart and chronic kidney disease with heart failure and stage 1 through stage 4 chronic kidney disease, or unspecified chronic kidney disease: Secondary | ICD-10-CM | POA: Diagnosis not present

## 2017-10-14 DIAGNOSIS — I5021 Acute systolic (congestive) heart failure: Secondary | ICD-10-CM | POA: Diagnosis not present

## 2017-10-14 DIAGNOSIS — J449 Chronic obstructive pulmonary disease, unspecified: Secondary | ICD-10-CM | POA: Diagnosis not present

## 2017-10-14 DIAGNOSIS — I2510 Atherosclerotic heart disease of native coronary artery without angina pectoris: Secondary | ICD-10-CM | POA: Diagnosis not present

## 2017-10-14 DIAGNOSIS — E785 Hyperlipidemia, unspecified: Secondary | ICD-10-CM | POA: Diagnosis not present

## 2017-10-14 DIAGNOSIS — G4733 Obstructive sleep apnea (adult) (pediatric): Secondary | ICD-10-CM | POA: Diagnosis not present

## 2017-10-14 DIAGNOSIS — I4891 Unspecified atrial fibrillation: Secondary | ICD-10-CM | POA: Diagnosis not present

## 2017-10-14 NOTE — Care Management (Signed)
[facility] unable to take Mr. Jose [name]. Referral given to [facility] Gwenette GreetBrenda S Mickael Mcnutt RN MSN CCM Care Management [phone]

## 2017-10-17 ENCOUNTER — Emergency Department: Payer: Medicare HMO

## 2017-10-17 ENCOUNTER — Emergency Department
Admission: EM | Admit: 2017-10-17 | Discharge: 2017-10-17 | Disposition: A | Discharge status: Other Acute Inpatient Hospital | Payer: Medicare HMO | Attending: Emergency Medicine | Admitting: Emergency Medicine

## 2017-10-17 ENCOUNTER — Encounter: Payer: Self-pay | Admitting: *Deleted

## 2017-10-17 DIAGNOSIS — X58XXXA Exposure to other specified factors, initial encounter: Secondary | ICD-10-CM | POA: Insufficient documentation

## 2017-10-17 DIAGNOSIS — Z79899 Other long term (current) drug therapy: Secondary | ICD-10-CM | POA: Insufficient documentation

## 2017-10-17 DIAGNOSIS — Z87891 Personal history of nicotine dependence: Secondary | ICD-10-CM | POA: Insufficient documentation

## 2017-10-17 DIAGNOSIS — M542 Cervicalgia: Secondary | ICD-10-CM | POA: Diagnosis not present

## 2017-10-17 DIAGNOSIS — S12090A Other displaced fracture of first cervical vertebra, initial encounter for closed fracture: Secondary | ICD-10-CM | POA: Diagnosis not present

## 2017-10-17 DIAGNOSIS — K567 Ileus, unspecified: Secondary | ICD-10-CM | POA: Diagnosis not present

## 2017-10-17 DIAGNOSIS — S0990XA Unspecified injury of head, initial encounter: Secondary | ICD-10-CM | POA: Diagnosis not present

## 2017-10-17 DIAGNOSIS — K219 Gastro-esophageal reflux disease without esophagitis: Secondary | ICD-10-CM | POA: Diagnosis not present

## 2017-10-17 DIAGNOSIS — Z7189 Other specified counseling: Secondary | ICD-10-CM | POA: Diagnosis not present

## 2017-10-17 DIAGNOSIS — S3991XA Unspecified injury of abdomen, initial encounter: Secondary | ICD-10-CM | POA: Diagnosis not present

## 2017-10-17 DIAGNOSIS — I5040 Unspecified combined systolic (congestive) and diastolic (congestive) heart failure: Secondary | ICD-10-CM | POA: Diagnosis not present

## 2017-10-17 DIAGNOSIS — I97711 Intraoperative cardiac arrest during other surgery: Secondary | ICD-10-CM | POA: Diagnosis not present

## 2017-10-17 DIAGNOSIS — W19XXXA Unspecified fall, initial encounter: Secondary | ICD-10-CM | POA: Diagnosis not present

## 2017-10-17 DIAGNOSIS — Y9389 Activity, other specified: Secondary | ICD-10-CM | POA: Insufficient documentation

## 2017-10-17 DIAGNOSIS — J90 Pleural effusion, not elsewhere classified: Secondary | ICD-10-CM | POA: Diagnosis not present

## 2017-10-17 DIAGNOSIS — I5022 Chronic systolic (congestive) heart failure: Secondary | ICD-10-CM | POA: Diagnosis not present

## 2017-10-17 DIAGNOSIS — I2510 Atherosclerotic heart disease of native coronary artery without angina pectoris: Secondary | ICD-10-CM | POA: Insufficient documentation

## 2017-10-17 DIAGNOSIS — J9811 Atelectasis: Secondary | ICD-10-CM | POA: Diagnosis not present

## 2017-10-17 DIAGNOSIS — J811 Chronic pulmonary edema: Secondary | ICD-10-CM | POA: Diagnosis not present

## 2017-10-17 DIAGNOSIS — S12100D Unspecified displaced fracture of second cervical vertebra, subsequent encounter for fracture with routine healing: Secondary | ICD-10-CM | POA: Diagnosis not present

## 2017-10-17 DIAGNOSIS — R51 Headache: Secondary | ICD-10-CM | POA: Diagnosis not present

## 2017-10-17 DIAGNOSIS — R0689 Other abnormalities of breathing: Secondary | ICD-10-CM | POA: Diagnosis not present

## 2017-10-17 DIAGNOSIS — Z955 Presence of coronary angioplasty implant and graft: Secondary | ICD-10-CM | POA: Diagnosis not present

## 2017-10-17 DIAGNOSIS — S0083XA Contusion of other part of head, initial encounter: Secondary | ICD-10-CM | POA: Insufficient documentation

## 2017-10-17 DIAGNOSIS — N183 Chronic kidney disease, stage 3 (moderate): Secondary | ICD-10-CM | POA: Diagnosis not present

## 2017-10-17 DIAGNOSIS — Z043 Encounter for examination and observation following other accident: Secondary | ICD-10-CM | POA: Diagnosis not present

## 2017-10-17 DIAGNOSIS — Z800 Family history of malignant neoplasm of digestive organs: Secondary | ICD-10-CM | POA: Diagnosis not present

## 2017-10-17 DIAGNOSIS — R4189 Other symptoms and signs involving cognitive functions and awareness: Secondary | ICD-10-CM | POA: Diagnosis not present

## 2017-10-17 DIAGNOSIS — Y929 Unspecified place or not applicable: Secondary | ICD-10-CM | POA: Diagnosis not present

## 2017-10-17 DIAGNOSIS — I129 Hypertensive chronic kidney disease with stage 1 through stage 4 chronic kidney disease, or unspecified chronic kidney disease: Secondary | ICD-10-CM | POA: Insufficient documentation

## 2017-10-17 DIAGNOSIS — Y999 Unspecified external cause status: Secondary | ICD-10-CM | POA: Insufficient documentation

## 2017-10-17 DIAGNOSIS — I130 Hypertensive heart and chronic kidney disease with heart failure and stage 1 through stage 4 chronic kidney disease, or unspecified chronic kidney disease: Secondary | ICD-10-CM | POA: Diagnosis not present

## 2017-10-17 DIAGNOSIS — S12120S Other displaced dens fracture, sequela: Secondary | ICD-10-CM | POA: Insufficient documentation

## 2017-10-17 DIAGNOSIS — S0003XA Contusion of scalp, initial encounter: Secondary | ICD-10-CM | POA: Diagnosis not present

## 2017-10-17 DIAGNOSIS — S12290A Other displaced fracture of third cervical vertebra, initial encounter for closed fracture: Secondary | ICD-10-CM | POA: Diagnosis not present

## 2017-10-17 DIAGNOSIS — S3993XA Unspecified injury of pelvis, initial encounter: Secondary | ICD-10-CM | POA: Diagnosis not present

## 2017-10-17 DIAGNOSIS — Z9981 Dependence on supplemental oxygen: Secondary | ICD-10-CM | POA: Diagnosis not present

## 2017-10-17 DIAGNOSIS — J449 Chronic obstructive pulmonary disease, unspecified: Secondary | ICD-10-CM | POA: Diagnosis not present

## 2017-10-17 DIAGNOSIS — Y33XXXA Other specified events, undetermined intent, initial encounter: Secondary | ICD-10-CM | POA: Diagnosis not present

## 2017-10-17 DIAGNOSIS — S129XXA Fracture of neck, unspecified, initial encounter: Secondary | ICD-10-CM | POA: Diagnosis not present

## 2017-10-17 DIAGNOSIS — S299XXA Unspecified injury of thorax, initial encounter: Secondary | ICD-10-CM | POA: Diagnosis not present

## 2017-10-17 DIAGNOSIS — S12190A Other displaced fracture of second cervical vertebra, initial encounter for closed fracture: Secondary | ICD-10-CM | POA: Diagnosis not present

## 2017-10-17 DIAGNOSIS — R918 Other nonspecific abnormal finding of lung field: Secondary | ICD-10-CM | POA: Diagnosis not present

## 2017-10-17 DIAGNOSIS — R079 Chest pain, unspecified: Secondary | ICD-10-CM | POA: Diagnosis not present

## 2017-10-17 DIAGNOSIS — S12110A Anterior displaced Type II dens fracture, initial encounter for closed fracture: Secondary | ICD-10-CM | POA: Diagnosis not present

## 2017-10-17 DIAGNOSIS — S12100A Unspecified displaced fracture of second cervical vertebra, initial encounter for closed fracture: Secondary | ICD-10-CM | POA: Diagnosis not present

## 2017-10-17 DIAGNOSIS — I4891 Unspecified atrial fibrillation: Secondary | ICD-10-CM | POA: Insufficient documentation

## 2017-10-17 DIAGNOSIS — S12000A Unspecified displaced fracture of first cervical vertebra, initial encounter for closed fracture: Secondary | ICD-10-CM | POA: Diagnosis not present

## 2017-10-17 DIAGNOSIS — R52 Pain, unspecified: Secondary | ICD-10-CM | POA: Diagnosis not present

## 2017-10-17 DIAGNOSIS — D62 Acute posthemorrhagic anemia: Secondary | ICD-10-CM | POA: Diagnosis not present

## 2017-10-17 DIAGNOSIS — I480 Paroxysmal atrial fibrillation: Secondary | ICD-10-CM | POA: Diagnosis not present

## 2017-10-17 DIAGNOSIS — S00532A Contusion of oral cavity, initial encounter: Secondary | ICD-10-CM | POA: Diagnosis not present

## 2017-10-17 DIAGNOSIS — I5030 Unspecified diastolic (congestive) heart failure: Secondary | ICD-10-CM | POA: Diagnosis not present

## 2017-10-17 DIAGNOSIS — R69 Illness, unspecified: Secondary | ICD-10-CM | POA: Diagnosis not present

## 2017-10-17 DIAGNOSIS — N189 Chronic kidney disease, unspecified: Secondary | ICD-10-CM | POA: Diagnosis not present

## 2017-10-17 DIAGNOSIS — S199XXA Unspecified injury of neck, initial encounter: Secondary | ICD-10-CM | POA: Diagnosis not present

## 2017-10-17 DIAGNOSIS — E785 Hyperlipidemia, unspecified: Secondary | ICD-10-CM | POA: Diagnosis not present

## 2017-10-17 DIAGNOSIS — M546 Pain in thoracic spine: Secondary | ICD-10-CM | POA: Diagnosis not present

## 2017-10-17 DIAGNOSIS — S12111A Posterior displaced Type II dens fracture, initial encounter for closed fracture: Secondary | ICD-10-CM | POA: Diagnosis not present

## 2017-10-17 DIAGNOSIS — R1312 Dysphagia, oropharyngeal phase: Secondary | ICD-10-CM | POA: Diagnosis not present

## 2017-10-17 DIAGNOSIS — J9601 Acute respiratory failure with hypoxia: Secondary | ICD-10-CM | POA: Diagnosis not present

## 2017-10-17 DIAGNOSIS — R319 Hematuria, unspecified: Secondary | ICD-10-CM | POA: Diagnosis not present

## 2017-10-17 DIAGNOSIS — Z4682 Encounter for fitting and adjustment of non-vascular catheter: Secondary | ICD-10-CM | POA: Diagnosis not present

## 2017-10-17 DIAGNOSIS — R000 Tachycardia, unspecified: Secondary | ICD-10-CM | POA: Diagnosis not present

## 2017-10-17 DIAGNOSIS — S80211A Abrasion, right knee, initial encounter: Secondary | ICD-10-CM | POA: Diagnosis not present

## 2017-10-17 DIAGNOSIS — I10 Essential (primary) hypertension: Secondary | ICD-10-CM | POA: Diagnosis not present

## 2017-10-17 LAB — CBC
HEMATOCRIT: 43.3 % (ref 40.0–52.0)
HEMOGLOBIN: 14.1 g/dL (ref 13.0–18.0)
MCH: 29.8 pg (ref 26.0–34.0)
MCHC: 32.5 g/dL (ref 32.0–36.0)
MCV: 91.5 fL (ref 80.0–100.0)
Platelets: 170 10*3/uL (ref 150–440)
RBC: 4.74 MIL/uL (ref 4.40–5.90)
RDW: 14.3 % (ref 11.5–14.5)
WBC: 9.1 10*3/uL (ref 3.8–10.6)

## 2017-10-17 LAB — BASIC METABOLIC PANEL
ANION GAP: 7 (ref 5–15)
BUN: 23 mg/dL — AB (ref 6–20)
CHLORIDE: 106 mmol/L (ref 101–111)
CO2: 27 mmol/L (ref 22–32)
Calcium: 8.9 mg/dL (ref 8.9–10.3)
Creatinine, Ser: 1.3 mg/dL — ABNORMAL HIGH (ref 0.61–1.24)
GFR calc Af Amer: 55 mL/min — ABNORMAL LOW (ref >60)
GFR, EST NON AFRICAN AMERICAN: 48 mL/min — AB (ref >60)
GLUCOSE: 149 mg/dL — AB (ref 65–99)
POTASSIUM: 3.4 mmol/L — AB (ref 3.5–5.1)
Sodium: 140 mmol/L (ref 135–145)

## 2017-10-17 LAB — TROPONIN I: Troponin I: <0.03 ng/mL

## 2017-10-17 MED ORDER — LABETALOL HCL 5 MG/ML IV SOLN
10.0000 mg | Freq: Once | INTRAVENOUS | Status: AC
  Administered 2017-10-17: 10 mg via INTRAVENOUS
  Filled 2017-10-17: qty 4

## 2017-10-17 MED ORDER — DILTIAZEM HCL 25 MG/5ML IV SOLN
10.0000 mg | Freq: Once | INTRAVENOUS | Status: AC
  Administered 2017-10-17: 10 mg via INTRAVENOUS
  Filled 2017-10-17: qty 5

## 2017-10-17 MED ORDER — FENTANYL CITRATE (PF) 100 MCG/2ML IJ SOLN
50.0000 ug | Freq: Once | INTRAMUSCULAR | Status: AC
  Administered 2017-10-17: 50 ug via INTRAVENOUS
  Filled 2017-10-17: qty 2

## 2017-10-17 MED ORDER — SODIUM CHLORIDE 0.9 % IV BOLUS (SEPSIS)
1000.0000 mL | Freq: Once | INTRAVENOUS | Status: AC
Start: 2017-10-17 — End: 2017-10-17
  Administered 2017-10-17: 1000 mL via INTRAVENOUS

## 2017-10-17 MED ORDER — ONDANSETRON HCL 4 MG/2ML IJ SOLN
4.0000 mg | Freq: Once | INTRAMUSCULAR | Status: AC
  Administered 2017-10-17: 4 mg via INTRAVENOUS
  Filled 2017-10-17: qty 2

## 2017-10-17 MED ORDER — MORPHINE SULFATE (PF) 2 MG/ML IV SOLN
2.0000 mg | Freq: Once | INTRAVENOUS | Status: AC
  Administered 2017-10-17: 2 mg via INTRAVENOUS
  Filled 2017-10-17: qty 1

## 2017-10-17 NOTE — Consult Note (Signed)
Neurosurgery-New Consultation Evaluation 10/17/2017 Jose HuhWilliam E Allsbrook Jr. [id number]  Identifying Statement: Jose HuhWilliam E Robideau Jr. is a 83 y[place]o. [place] [id number] with known odontoid fracture  Physician Requesting Consultation: Dr. Sharma CovertNorman, Emergency Department  History of Present Illness: Jose [name] is here after experiencing a fall today. He states he got dizzy and fell forward. He denies and loss of consciousness and notices no new symptoms in his arms or legs.  He has a history of multiple falls and was diagnosed with an odontoid fracture last week on 10/15. He was placed in a cervical collar given overall stability and planned follow up. Imaging after fall today reveals worsened angulation and translation of the fracture.   He states he has no numbness or weakness. He has some neck pain. He has not been started back on any anticoagulation given falls and recent scalp hematoma. He denies any aspirin use. He has had no trouble breathing. He denies any chest pain. He has been wearing the Aspen collar.   Past Medical History:  Past Medical History:  Diagnosis Date  . Allergy   . BPH (benign prostatic hyperplasia)   . Chronic kidney disease   . DDD (degenerative disc disease), lumbar   . Depression   . Diverticulosis   . Elevated PSA   . Gross hematuria   . Gynecomastia   . Headache   . History of kidney stones   . HTN (hypertension)   . Incomplete bladder emptying   . Nodular prostate with urinary obstruction   . Organic impotence   . OSA (obstructive sleep apnea)   . Renal colic   . Rosacea   . Sciatica   . Urinary frequency     Social History: Social History   Social History  . Marital status: Widowed    Spouse name: N/A  . Number of children: 5  . Years of education: N/A   Occupational History  . Retired     Former Airline pilotAccountant   Social History Main Topics  . Smoking status: Former Smoker    Packs/day: 3.00    Years: 25.00    Types: Cigarettes    Quit  date: 12/27/1978  . Smokeless tobacco: Never Used  . Alcohol use No  . Drug use: No  . Sexual activity: Not on file   Other Topics Concern  . Not on file   Social History Narrative  . No narrative on file    Family History: Family History  Problem Relation Age of Onset  . Heart disease Father   . Diabetes Father        type 2  . Hyperlipidemia Father   . Stroke Father   . Kidney disease Father   . Hypertension Other   . Breast cancer Other   . Lung cancer Other   . Alcohol abuse Paternal Uncle   . Cancer Sister   . Cancer Son   . Prostate cancer Neg Hx     Review of Systems:  Review of Systems - General ROS: Negative Psychological ROS: Negative Ophthalmic ROS: Negative ENT ROS: Negative Hematological and Lymphatic ROS: Negative  Endocrine ROS: Negative Respiratory ROS: Negative Cardiovascular ROS: Negative Gastrointestinal ROS: Negative Genito-Urinary ROS: Negative Musculoskeletal ROS: Positive for neck pain  Neurological ROS: Negative for weakness or numbness Dermatological ROS: Negative  Physical Exam: BP (!) 175/98   Pulse 73   Temp 97.8 F (36.6 C) (Oral)   Resp 13   Ht 5\' 9"  (1.753 m)   Wt 90.7  kg (200 lb)   SpO2 92%   BMI 29.53 kg/m  Body mass index is 29.53 kg/m. Body surface area is 2.1 meters squared. General appearance: Alert, cooperative, appears uncomfortable in bed Head: Normocephalic, bruising and hematoma noted over left forehead and peri-orbital region, not acute Eyes: Normal, EOM intact Oropharynx: Moist without lesions Neck: Aspen collar in place Ext: No edema in LE bilaterally  Neurologic exam:  Mental status: alertness: alert, affect: normal Speech: fluent and clear Motor:strength symmetric 5/5, normal muscle mass and tone in all extremities  Sensory: intact to light touch in all extremities Gait: Not tested    Imaging: CT Cervical Spine: There is a fracture through the inferior odontoid with complete displacement of the  odontoid fragment posteriorly and toward the left causing impression on the craniocervical junction. Stenosis is moderate at the craniocervical junction due to this displaced and somewhat rotated odontoid fragment   Impression/Plan:  Jose [name] is here with worsened angulation and translation of his fracture. Neurologically, he is stable and no focal weakness is found. We have discussed that conservative management at this time could result in paralysis and he will likely need surgical stabilization. He does wish to consider this. He should remain in the collar for now. He will need transfer to higher level facility for definitive treatment given lack of capability here.   Hold all anticoagulation and antiplatelets.  Continue neurological checks    1.  Diagnosis: odontoid fracture  2.  Plan - Transfer for discussion of surgical stabilization - Continue cervical collar

## 2017-10-17 NOTE — ED Provider Notes (Addendum)
The patient was signed out to me by Dr. Bayard Malesandolph Brown. 83 year old with a history of A. Fib and recurrent falls, known odontoid fracture from previous fall 10/09/17, presenting with recurrent fall, again in A. Fib with RVR. The patient has a CT scan which shows a significant displacement of a previously on displaced odontoid fracture today. I went to reexamine the patient, and he continues to be in A. Fib with RVR without any chest pain or shortness of breath. He does report pain in his throat and in the posterior neck. He is in an Biochemist, clinicalAspen collar which I have adjusted by tightening it. He has no numbness tingling or weakness in the upper extremities and on my exam has 5 out of 5 grip strength, biceps and triceps strength. I've spoken with Dr. Adriana Simasook, the neurosurgeon on-call, who see the patient in the next 20 minutes to make a final disposition.  ----------------------------------------- 10:31 AM on 10/17/2017 -----------------------------------------  The patient continues to be neurovascularly stable. His A. Fib with RVRid not initially respond to diltiazem, so treated him with labetalol with normalization of his heart rate and maintenance of good blood pressure. Now his heart rate slightly increasing so we will re-dose him. I spoke with the transfer center, who is getting in touch with the neurosurgeon for final disposition.  CRITICAL CARE Performed by: Rockne MenghiniNorman, Anne-Caroline   Total critical care time: 40 minutes  Critical care time was exclusive of separately billable procedures and treating other patients.  Critical care was necessary to treat or prevent imminent or life-threatening deterioration.  Critical care was time spent personally by me on the following activities: development of treatment plan with patient and/or surrogate as well as nursing, discussions with consultants, evaluation of patient's response to treatment, examination of patient, obtaining history from patient or surrogate,  ordering and performing treatments and interventions, ordering and review of laboratory studies, ordering and review of radiographic studies, pulse oximetry and re-evaluation of patient's condition.    Rockne MenghiniNorman, Anne-Caroline, MD 10/17/17 0840    Rockne MenghiniNorman, Anne-Caroline, MD 10/17/17 1032

## 2017-10-17 NOTE — ED Provider Notes (Signed)
[facility] Emergency Department Provider Note   First MD Initiated Contact with Patient 10/17/17 0719     (approximate)  I have reviewed the triage vital signs and the nursing notes.   HISTORY  Chief Complaint Fall    HPI Jose [name]. is a 83 y.o. male presents with history of fall this morning. Patient admits to dizziness beforehand followed by fall with head injury. Patient also admits to fall 1 week ago with head injury. Patient denies any loss of consciousness today.patient denies any weakness numbness or visual changes.  Patient also admits to diffuse posterior neck pain. Patient denies any nausea vomiting. Patient denies any abdominal pain or chest pain. Patient denies any dyspnea.patient states current pain score 7 out of 10.   Past Medical History:  Diagnosis Date  . Allergy   . BPH (benign prostatic hyperplasia)   . Chronic kidney disease   . DDD (degenerative disc disease), lumbar   . Depression   . Diverticulosis   . Elevated PSA   . Gross hematuria   . Gynecomastia   . Headache   . History of kidney stones   . HTN (hypertension)   . Incomplete bladder emptying   . Nodular prostate with urinary obstruction   . Organic impotence   . OSA (obstructive sleep apnea)   . Renal colic   . Rosacea   . Sciatica   . Urinary frequency     Patient Active Problem List   Diagnosis Date Noted  . SOB (shortness of breath) 10/09/2017  . Fall at home, initial encounter 10/09/2017  . Atrial fibrillation with RVR (HCC) 10/09/2017  . Odontoid fracture (HCC) 10/09/2017  . Hematuria 07/25/2017  . Sepsis (HCC) 04/30/2017  . Hydronephrosis with urinary obstruction due to ureteral calculus 05/16/2016  . Kidney stones 04/14/2016  . Flank pain 04/14/2016  . BPH with obstruction/lower urinary tract symptoms 04/14/2016  . Erectile dysfunction of organic origin 04/14/2016  . Arthritis, degenerative 02/20/2016  . LV dysfunction 02/20/2016  .  Allergic rhinitis 02/13/2016  . COPD (chronic obstructive pulmonary disease) (HCC) 02/13/2016  . Nephrolithiasis 02/13/2016  . GERD (gastroesophageal reflux disease) 02/13/2016  . Diverticulosis of colon 02/13/2016  . Unspecified visual disturbance 02/13/2016  . Constipation 02/13/2016  . Chronic kidney disease (CKD), stage III (moderate) (HCC) 02/13/2016  . Memory loss 02/13/2016  . Chronic headache disorder 02/13/2016  . Pre-diabetes 02/13/2016  . BPH (benign prostatic hyperplasia) 02/13/2016  . Urinary incontinence without sensory awareness 11/22/2012  . Nodular prostate 11/22/2012  . Incomplete bladder emptying 11/22/2012  . Elevated prostate specific antigen (PSA) 11/22/2012  . Calculus of kidney 11/22/2012  . Obstructive sleep apnea 10/06/2011  . Lumbar disc disease 01/28/2010  . Spondylosis 01/28/2010  . HLD (hyperlipidemia) 05/25/2006  . Essential (primary) hypertension 05/25/2006  . CAD (coronary artery disease) 05/21/2000    Past Surgical History:  Procedure Laterality Date  . ANGIOPLASTY  2001   PTCA and stenting of LAD by Dr. Juliann Pares  . CATARACT EXTRACTION Bilateral 07/2006  . CT Scan of head  12/01/2004   Normal  . DOPPLER ECHOCARDIOGRAPHY  03/19/2013   Normal; Moderate global LV dysfunction. EF=45%. Mild Aortic insufficiency  . Double Ureter stent placement  08/1999   Double J Ureter stent placement  . EXTRACORPOREAL SHOCK WAVE LITHOTRIPSY Right 04/29/2016   Procedure: EXTRACORPOREAL SHOCK WAVE LITHOTRIPSY (ESWL);  Surgeon: Vanna Scotland, MD;  Location: [facility] ORS;  Service: Urology;  Laterality: Right;  . MRI CERVICAL SPINE WO CONTRAST (  [facility] HX)  01/28/2010   Abnormal Results; Arhritis and bulging discs. Referral to Neurosurgery  . MRI CERVICAL SPINE WO CONTRAST ([facility] HX)  10/19/2005   Abnormal, Bone spurs  . Sleep study  10/06/2011   Severe sleep apnea. AHI= 35.4 per hr. Done at [facility]  . SPIROMETRY  09/11/2007   Moderately Severe obstruction  .  TRANSURETHRAL RESECTION OF PROSTATE      Prior to Admission medications   Medication Sig Start Date End Date Taking? Authorizing Provider  diltiazem (CARDIZEM CD) 120 MG 24 hr capsule Take 1 capsule (120 mg total) by mouth daily. 10/13/17   Enedina FinnerPatel, Sona, MD  finasteride (PROSCAR) 5 MG tablet Take 1 tablet (5 mg total) by mouth daily. 07/26/17   Enid BaasKalisetti, Radhika, MD  furosemide (LASIX) 20 MG tablet Take 1 tablet (20 mg total) by mouth daily. 10/13/17   Enedina FinnerPatel, Sona, MD  metoprolol tartrate (LOPRESSOR) 25 MG tablet Take 1 tablet (25 mg total) by mouth 2 (two) times daily. 10/13/17   Enedina FinnerPatel, Sona, MD  simvastatin (ZOCOR) 40 MG tablet Take 1 tablet by mouth at bedtime.  01/21/16   [provider]  tamsulosin (FLOMAX) 0.4 MG CAPS capsule Take 1 capsule (0.4 mg total) by mouth daily. 09/15/17   Malva LimesFisher, Donald E, MD    Allergies Finasteride  Family History  Problem Relation Age of Onset  . Heart disease Father   . Diabetes Father        type 2  . Hyperlipidemia Father   . Stroke Father   . Kidney disease Father   . Hypertension Other   . Breast cancer Other   . Lung cancer Other   . Alcohol abuse Paternal Uncle   . Cancer Sister   . Cancer Son   . Prostate cancer Neg Hx     Social History Social History  Substance Use Topics  . Smoking status: Former Smoker    Packs/day: 3.00    Years: 25.00    Types: Cigarettes    Quit date: 12/27/1978  . Smokeless tobacco: Never Used  . Alcohol use No    Review of Systems Constitutional: No fever/chills Eyes: No visual changes. ENT: No sore throat. Cardiovascular: Denies chest pain. Respiratory: Denies shortness of breath. Gastrointestinal: No abdominal pain.  No nausea, no vomiting.  No diarrhea.  No constipation. Genitourinary: Negative for dysuria. Musculoskeletal: Negative for neck pain.  Negative for back pain. Integumentary: Negative for rash. Neurological: Negative for headaches, focal weakness or numbness.positive for  dizziness   ____________________________________________   PHYSICAL EXAM:  VITAL SIGNS: ED Triage Vitals  Enc Vitals Group     BP 10/17/17 0645 (!) 170/137     Pulse Rate 10/17/17 0645 67     Resp 10/17/17 0645 (!) 23     Temp 10/17/17 0649 97.8 F (36.6 C)     Temp Source 10/17/17 0649 Oral     SpO2 10/17/17 0645 96 %     Weight 10/17/17 0652 90.7 kg (200 lb)     Height 10/17/17 0652 1.753 m (5\' 9" )     Head Circumference --      Peak Flow --      Pain Score 10/17/17 0647 8     Pain Loc --      Pain Edu? --      Excl. in GC? --     Constitutional: Alert and oriented. Well appearing and in no acute distress. Eyes: Conjunctivae are normal. PERRL. EOMI. Head:multiple areas of ecchymoses and  swelling noted predominantly left forehead and left periorbital region with varying stages of healing Ears:  Healthy appearing ear canals and TMs bilaterally Nose: No congestion/rhinnorhea. Mouth/Throat: Mucous membranes are moist. Oropharynx non-erythematous. Neck: No stridor.  diffuse posterior C-spine pain with palpation Cardiovascular: Tachycardia, regular rhythm. Good peripheral circulation. Grossly normal heart sounds. Respiratory: Normal respiratory effort.  No retractions. Lungs CTAB. Gastrointestinal: Soft and nontender. No distention.  Musculoskeletal: No lower extremity tenderness nor edema. No gross deformities of extremities.2+ bilateral lower extremity pitting edema Neurologic:  Normal speech and language. No gross focal neurologic deficits are appreciated.  Skin:  Skin is warm, dry and intact. No rash noted. Psychiatric: Mood and affect are normal. Speech and behavior are normal.  ____________________________________________   LABS (all labs ordered are listed, but only abnormal results are displayed)  Labs Reviewed  BASIC METABOLIC PANEL  CBC  TROPONIN I   ____________________________________________  EKG  ED ECG REPORT I, Whiteside N Indie Boehne, the attending  physician, personally viewed and interpreted this ECG.   Date: 10/17/2017  EKG Time: 6:50 AM  Rate: 139  Rhythm:atrial fibrillation with rapid ventricular response  Axis: normal  Intervals:irregular R R interval  ST&T Change: none   Procedures   ____________________________________________   INITIAL IMPRESSION / ASSESSMENT AND PLAN / ED COURSE  As part of my medical decision making, I reviewed the following data within the electronic MEDICAL RECORD NUMBER 83 year old male presenting with above stated history and physical exam of dizziness fall H of fibrillation with rapid ventricular response. CT scan of the head cervical spine will be performed secondary to above stated physical exam revealing multiple contusions/ecchymoses to the face as well as diffuse C-spine pain with palpation. Regarding patient's atrial fibrillation rapid ventricular response diltiazem 10 mg IV will be administered. Patient discussed with Dr. Sharma Covert in sign out.    ____________________________________________  FINAL CLINICAL IMPRESSION(S) / ED DIAGNOSES  Final diagnoses:  Contusion of face, initial encounter  Atrial fibrillation with rapid ventricular response (HCC)  Injury of head, initial encounter     MEDICATIONS GIVEN DURING THIS VISIT:  Medications  diltiazem (CARDIZEM) injection 10 mg (10 mg Intravenous Given 10/17/17 0726)  morphine 2 MG/ML injection 2 mg (2 mg Intravenous Given 10/17/17 0725)  ondansetron (ZOFRAN) injection 4 mg (4 mg Intravenous Given 10/17/17 0725)     NEW OUTPATIENT MEDICATIONS STARTED DURING THIS VISIT:  New Prescriptions   No medications on file    Modified Medications   No medications on file    Discontinued Medications   No medications on file     Note:  This document was prepared using Dragon voice recognition software and may include unintentional dictation errors.    Darci Current, MD 10/17/17 [phone]

## 2017-10-17 NOTE — ED Notes (Signed)
[facility]  GROUND  UNIT  COMING  FOR  TRANSPORT  INFORMED  RN  Erskine SquibbJANE  AND  DR  Sharma CovertNORMAN MD

## 2017-10-17 NOTE — ED Notes (Signed)
EMTALA reviewed. 

## 2017-10-17 NOTE — ED Notes (Signed)
AAOx3.  Skin warm and dry.  NAD 

## 2017-10-17 NOTE — ED Notes (Signed)
Oral swabs given to patient for comfort.

## 2017-10-17 NOTE — ED Notes (Signed)
AAOx3.  Skin warm and dry. NAD.  [facility]

## 2017-10-19 ENCOUNTER — Ambulatory Visit: Payer: Medicare HMO | Admitting: Family

## 2017-10-25 DIAGNOSIS — N183 Chronic kidney disease, stage 3 (moderate): Secondary | ICD-10-CM | POA: Diagnosis not present

## 2017-10-25 DIAGNOSIS — J449 Chronic obstructive pulmonary disease, unspecified: Secondary | ICD-10-CM

## 2017-10-25 DIAGNOSIS — I2510 Atherosclerotic heart disease of native coronary artery without angina pectoris: Secondary | ICD-10-CM | POA: Diagnosis not present

## 2017-10-25 DIAGNOSIS — S12100D Unspecified displaced fracture of second cervical vertebra, subsequent encounter for fracture with routine healing: Secondary | ICD-10-CM

## 2017-10-25 DIAGNOSIS — I4891 Unspecified atrial fibrillation: Secondary | ICD-10-CM | POA: Diagnosis not present

## 2017-10-25 DIAGNOSIS — I130 Hypertensive heart and chronic kidney disease with heart failure and stage 1 through stage 4 chronic kidney disease, or unspecified chronic kidney disease: Secondary | ICD-10-CM | POA: Diagnosis not present

## 2017-10-27 DEATH — deceased

## 2017-11-08 ENCOUNTER — Telehealth: Payer: Self-pay | Admitting: Family Medicine

## 2017-11-08 NOTE — Telephone Encounter (Signed)
FYI

## 2017-11-08 NOTE — Telephone Encounter (Signed)
Pt's son called after receiving an automated call to advise that pt pasted away on 10/10/2017. Please advise. Thanks TNP

## 2018-03-16 ENCOUNTER — Ambulatory Visit: Payer: Medicare HMO | Admitting: Urology

## 2018-12-08 IMAGING — CR DG CHEST 2V
2 series · 2 of 2 positions shown · non-contrast
Comparison: Single-view of the chest 05/01/2017. PA and lateral
chest 04/30/2017 and 07/08/2010.

CLINICAL DATA: Shortness of breath for 3-4 days.

EXAM:
CHEST  2 VIEW

[chest pa]
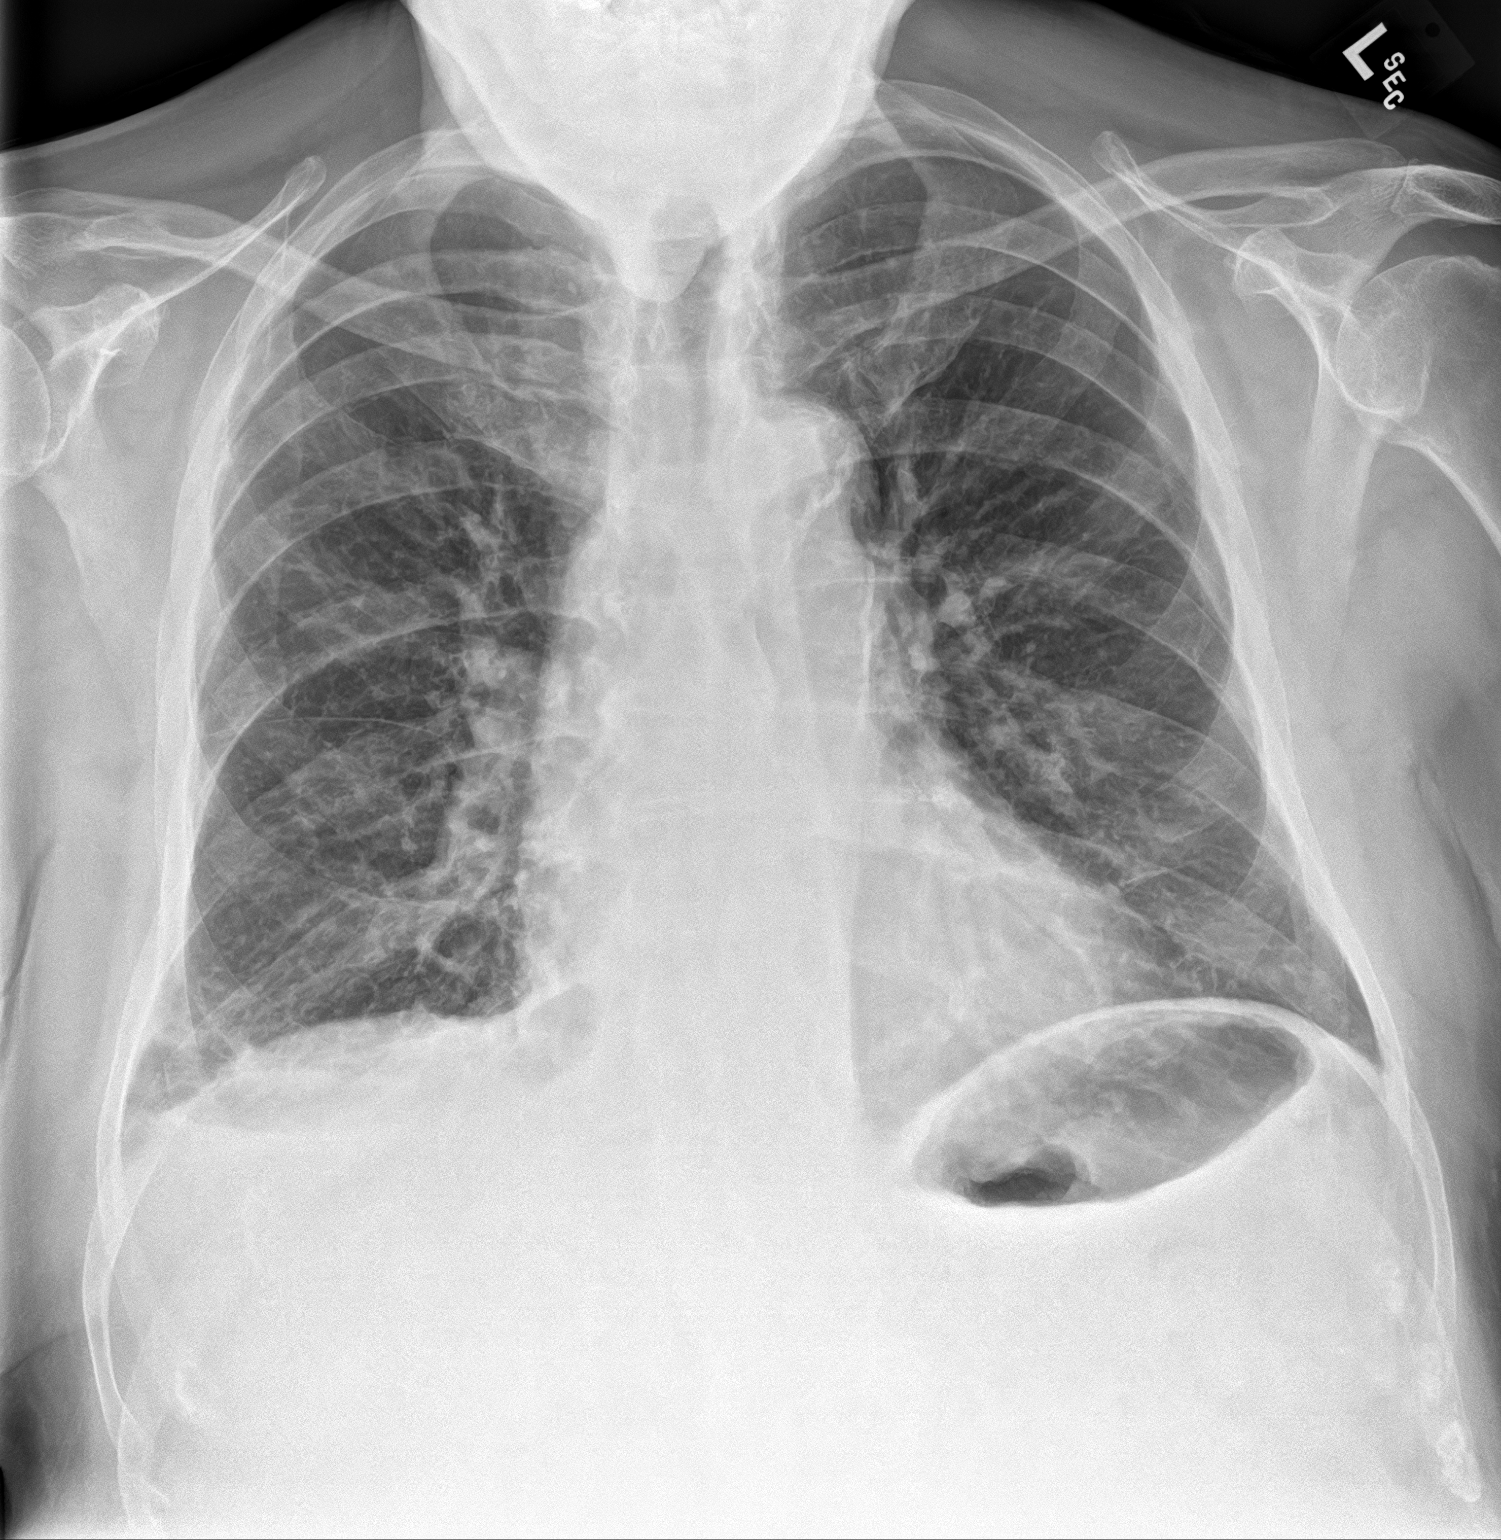

[chest lat]
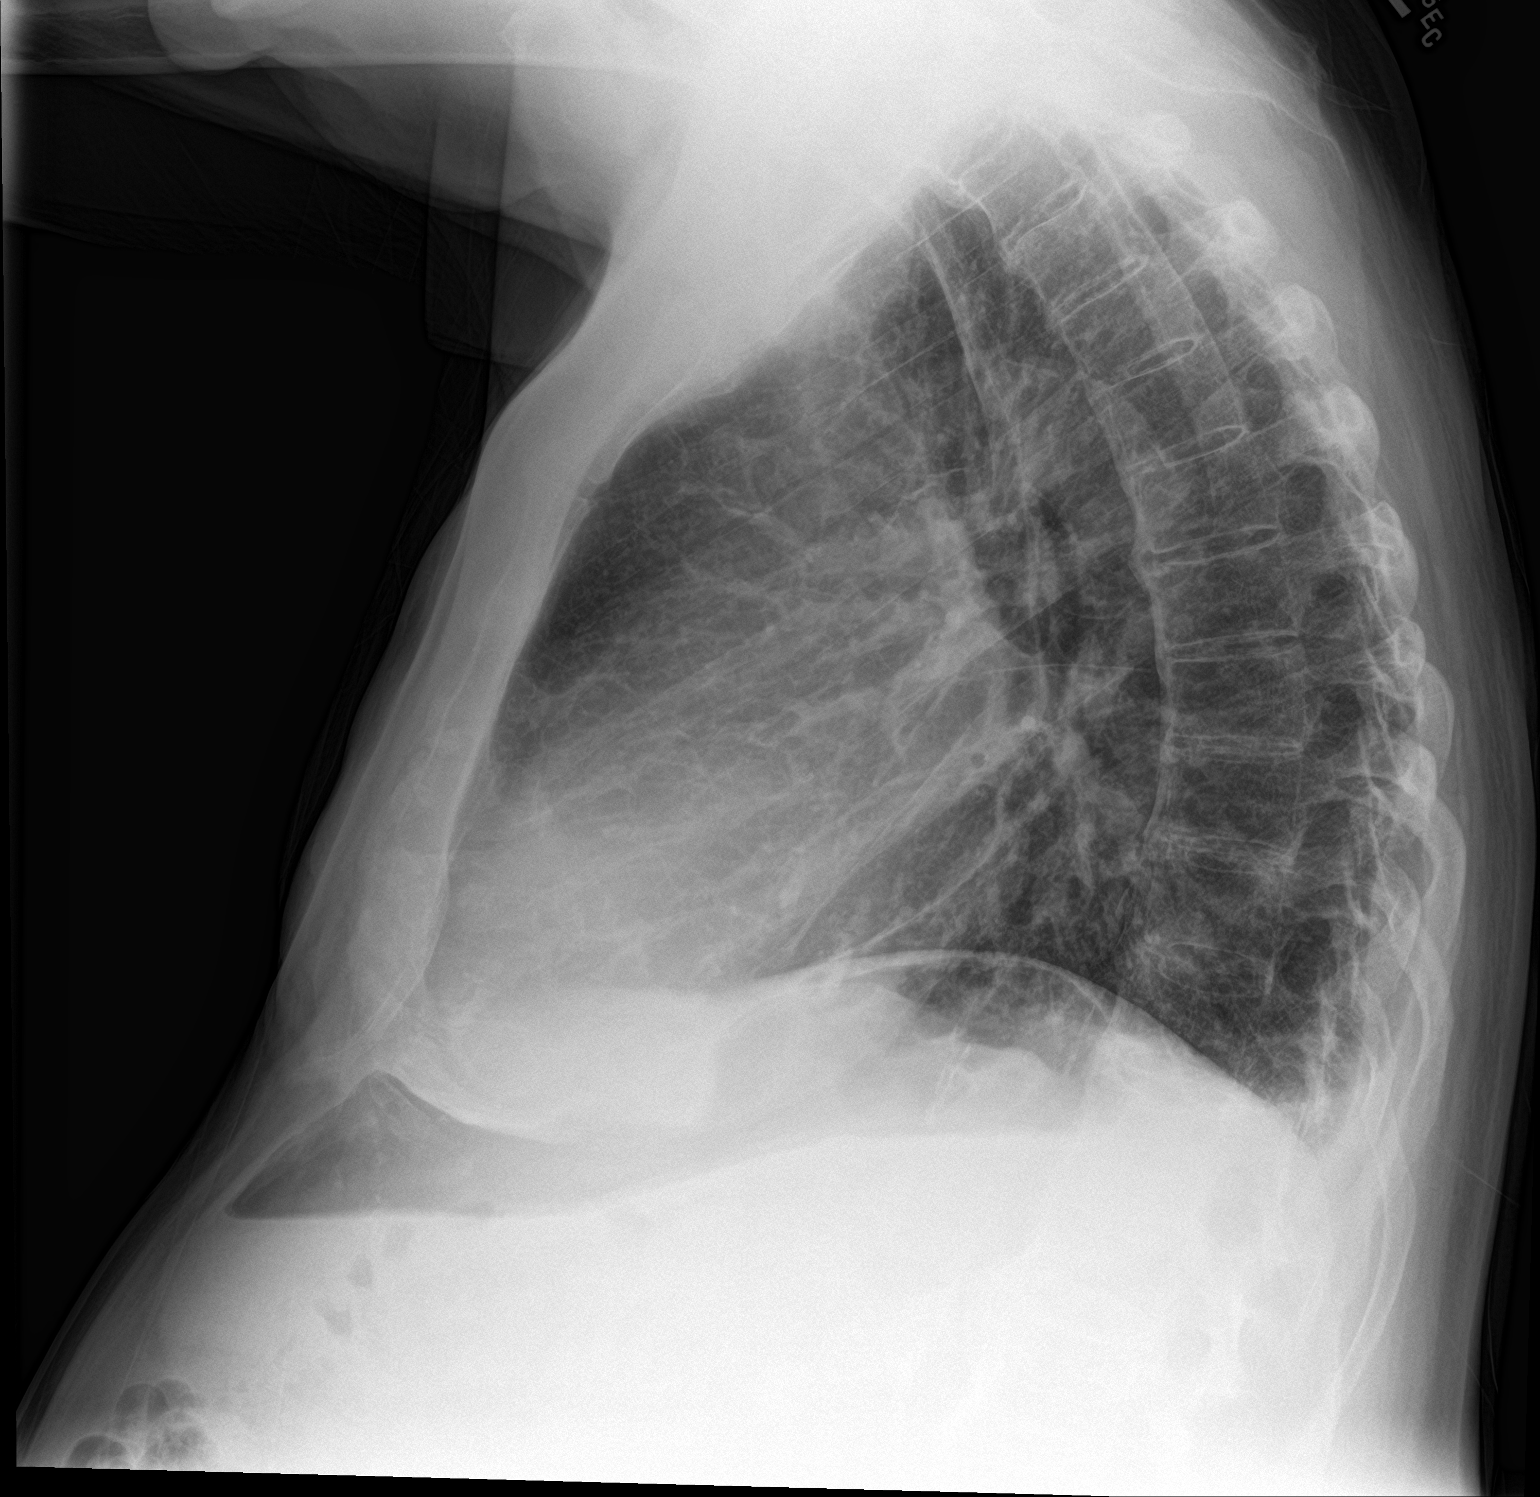

[2 of 2 positions shown; findings below may reference images not displayed]

FINDINGS: Streaky airspace disease is seen in the right lung base. Trace right
pleural effusion is noted. Left lung is clear. Heart size is upper
normal. Aortic atherosclerosis is seen. No pneumothorax. No acute
bony abnormality.
IMPRESSION: Trace right pleural effusion and streaky right basilar airspace
disease which could be atelectasis or pneumonia.

Atherosclerosis.

## 2018-12-08 IMAGING — CT CT HEAD W/O CM
4 of 5 series · 15 of 47 positions shown, 17 images · non-contrast
Comparison: 04/30/2017, by report from 10/19/2005

CLINICAL DATA: Increasing weakness and falls

EXAM:
CT HEAD WITHOUT CONTRAST
CT CERVICAL SPINE WITHOUT CONTRAST
TECHNIQUE: Multidetector CT imaging of the head and cervical spine was
performed following the standard protocol without intravenous
contrast. Multiplanar CT image reconstructions of the cervical spine
were also generated.

[Series 2: head wo · axial · 0.47mm/px · z∈[-209,-109]mm · 6 of 30 slices shown, 8 images]
[im 5/30  brain]
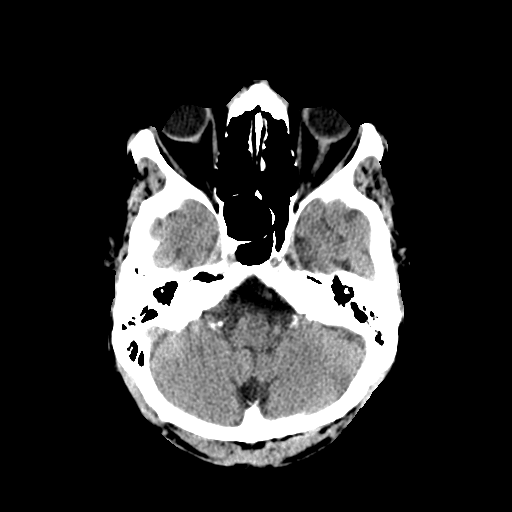
[im 5/30  bone]
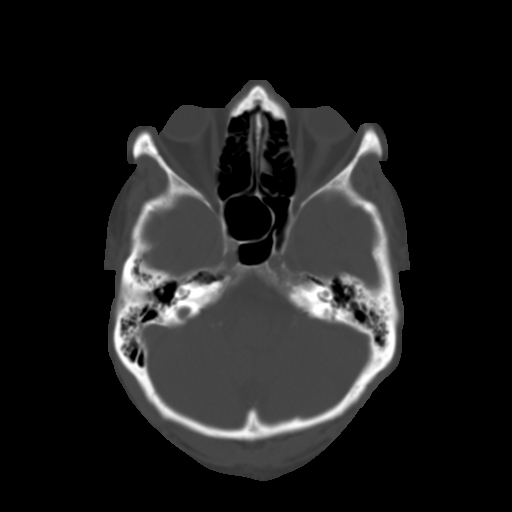
[im 9/30  brain]
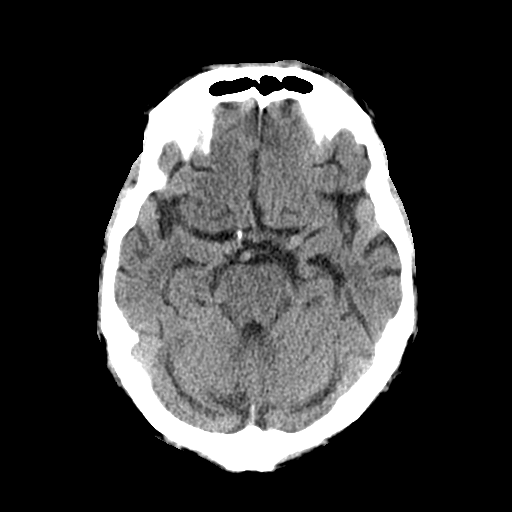
[im 13/30  brain]
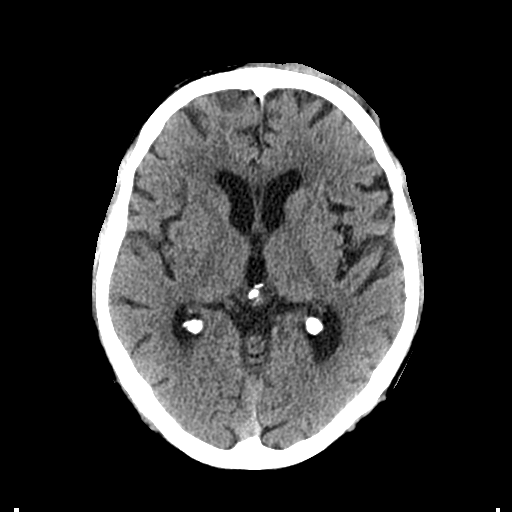
[im 17/30  brain]
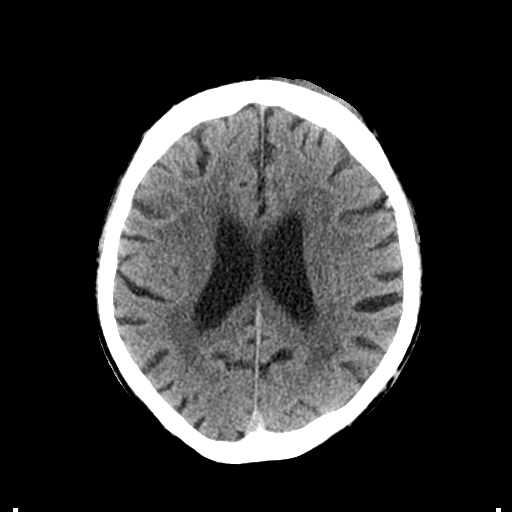
[im 21/30  brain]
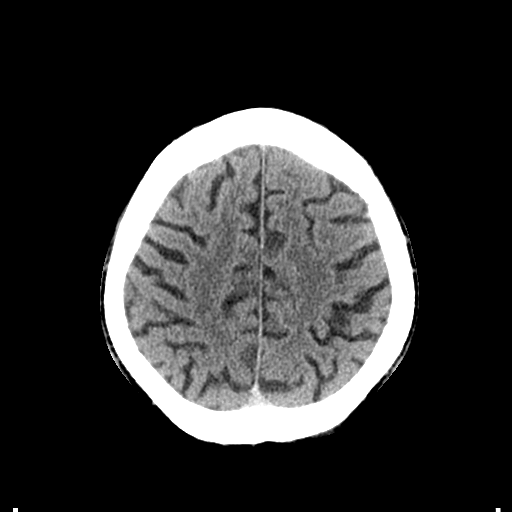
[im 21/30  bone]
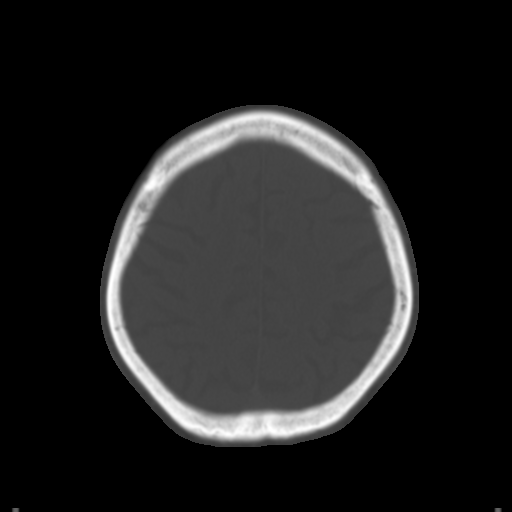
[im 25/30  brain]
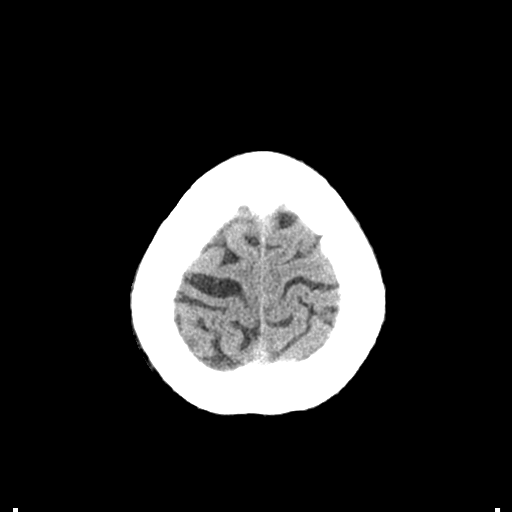

[Series 4: coronal soft tissue · coronal · 0.29mm/px · 3 of 69 slices shown]
[im 23/69  brain]
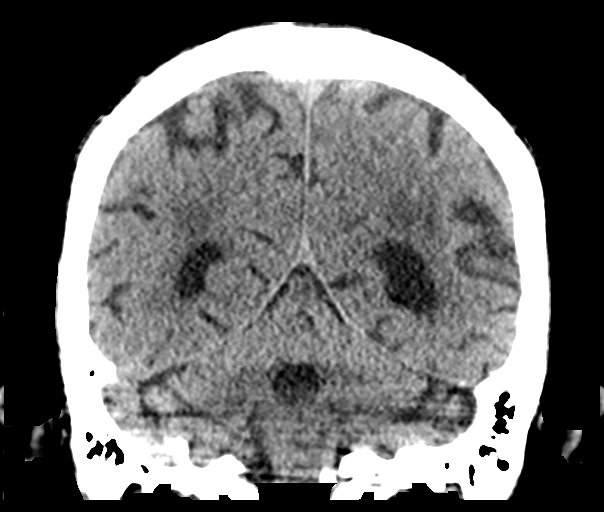
[im 31/69  brain]
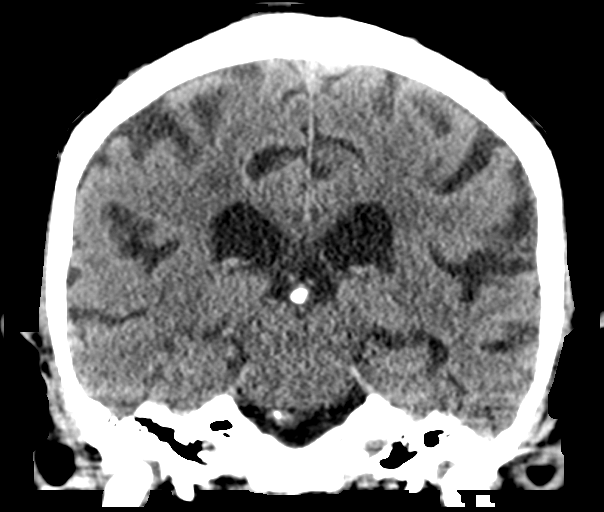
[im 38/69  brain]
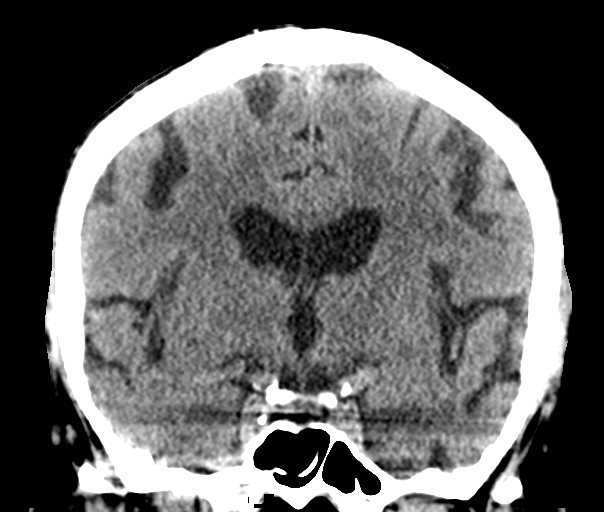

[Series 5: sagittal soft tissue · sagittal · 0.29mm/px · 2 of 57 slices shown]
[im 19/57  brain]
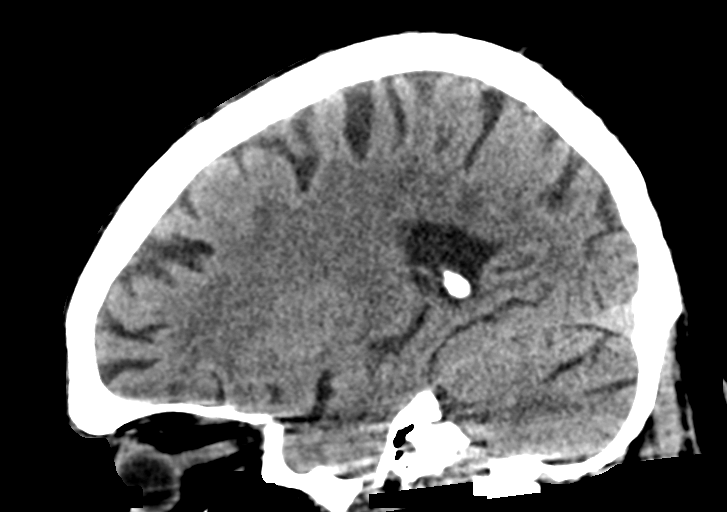
[im 38/57  brain]
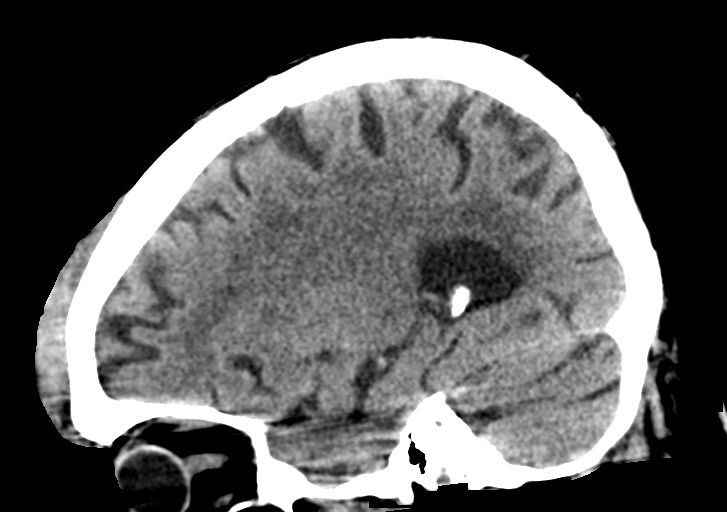

[Series 7: c spine soft · axial · 0.44mm/px · z∈[-366,-310]mm · 4 of 83 slices shown]
[im 8/83  brain]
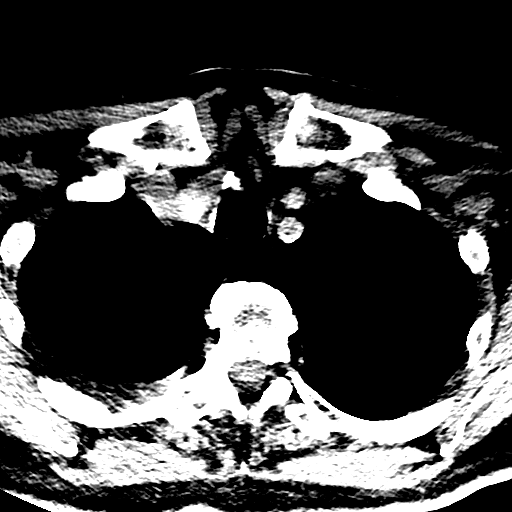
[im 16/83  brain]
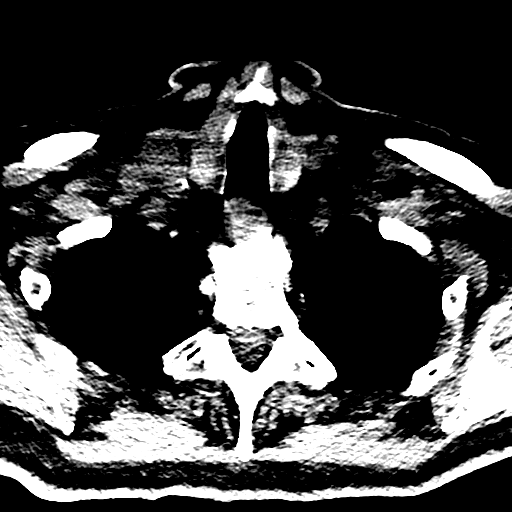
[im 28/83  brain]
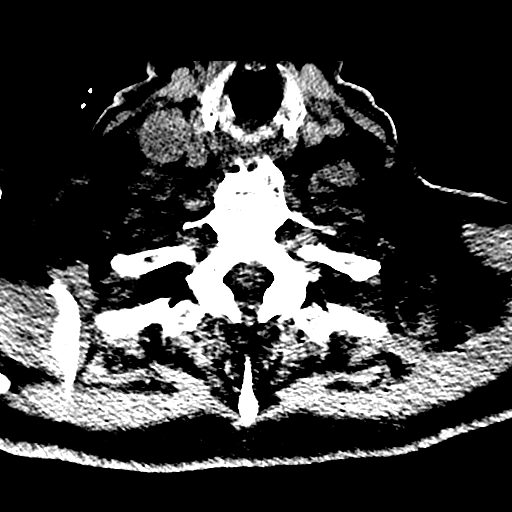
[im 36/83  brain]
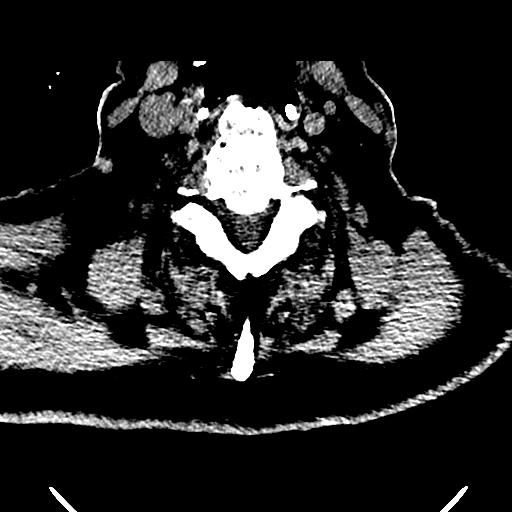

[15 of 47 positions shown; findings below may reference images not displayed]

FINDINGS: CT HEAD FINDINGS

Brain: Chronic atrophic and ischemic changes are again identified
and stable. No findings to suggest acute hemorrhage, acute
infarction or space-occupying mass lesion are noted.

Vascular: No hyperdense vessel or unexpected calcification.

Skull: Normal. Negative for fracture or focal lesion.

Sinuses/Orbits: No acute finding.

Other: Soft tissue hematoma is noted in the left forehead.

CT CERVICAL SPINE FINDINGS

Alignment: Alignment is well maintained.

Skull base and vertebrae: There is an undisplaced fracture at the
base of the odontoid. It extends from the anterior margin of C2
posteriorly but does not appear to involve the posterior cortex of
C2. Large bridging osteophytes are noted from C3 to T2. These are
stable in appearance from the prior MRI examination. These changes
are in part due to prior cervical fusion. Facet hypertrophic changes
are noted at multiple levels. No other fracture is seen.

Soft tissues and spinal canal: No significant prevertebral soft
tissue abnormality is noted. A a rounded fluid attenuation lesion is
noted in the posterior soft tissues of the neck eccentric to the
right likely related to a sebaceous cyst. Diffuse vascular
calcifications are noted.

Upper chest: Within normal limits.

Other: None
IMPRESSION: CT of the head: Chronic atrophic and ischemic changes without acute
intracranial abnormality.

Left frontal scalp hematoma.

CT of the cervical spine: Fracture through the anterior aspect of
the base of the odontoid which does not appear to extend to the
posterior cortical margin of C2.

Multilevel degenerative changes with anterior osteophytes and facet
hypertrophic changes. No other fracture is seen.

Critical Value/emergent results were called by telephone at the time
of interpretation on 10/09/2017 at [DATE] to Dr. GHEORGHIE MANDALA , who
verbally acknowledged these results.
